# Patient Record
Sex: Female | Born: 1935 | Hispanic: No | Smoking: Former smoker
Health system: Southern US, Community
[De-identification: ages and names within clinical notes are randomized; demographics above are authoritative.]

## PROBLEM LIST (undated history)

## (undated) DIAGNOSIS — I2510 Atherosclerotic heart disease of native coronary artery without angina pectoris: Secondary | ICD-10-CM

## (undated) DIAGNOSIS — M810 Age-related osteoporosis without current pathological fracture: Secondary | ICD-10-CM

## (undated) DIAGNOSIS — E785 Hyperlipidemia, unspecified: Secondary | ICD-10-CM

## (undated) DIAGNOSIS — I10 Essential (primary) hypertension: Secondary | ICD-10-CM

## (undated) DIAGNOSIS — N6009 Solitary cyst of unspecified breast: Secondary | ICD-10-CM

## (undated) DIAGNOSIS — I6340 Cerebral infarction due to embolism of unspecified cerebral artery: Secondary | ICD-10-CM

## (undated) DIAGNOSIS — I739 Peripheral vascular disease, unspecified: Secondary | ICD-10-CM

## (undated) DIAGNOSIS — H3530 Unspecified macular degeneration: Secondary | ICD-10-CM

## (undated) DIAGNOSIS — M129 Arthropathy, unspecified: Secondary | ICD-10-CM

## (undated) DIAGNOSIS — G458 Other transient cerebral ischemic attacks and related syndromes: Secondary | ICD-10-CM

## (undated) DIAGNOSIS — L723 Sebaceous cyst: Secondary | ICD-10-CM

## (undated) HISTORY — DX: Hyperlipidemia, unspecified: E78.5

## (undated) HISTORY — DX: Atherosclerotic heart disease of native coronary artery without angina pectoris: I25.10

## (undated) HISTORY — DX: Essential (primary) hypertension: I10

## (undated) HISTORY — DX: Peripheral vascular disease, unspecified: I73.9

## (undated) HISTORY — DX: Unspecified macular degeneration: H35.30

## (undated) HISTORY — DX: Cerebral infarction due to embolism of unspecified cerebral artery: I63.40

## (undated) HISTORY — DX: Age-related osteoporosis without current pathological fracture: M81.0

## (undated) HISTORY — DX: Arthropathy, unspecified: M12.9

## (undated) HISTORY — DX: Other transient cerebral ischemic attacks and related syndromes: G45.8

## (undated) HISTORY — PX: TONSILLECTOMY: SUR1361

## (undated) HISTORY — DX: Sebaceous cyst: L72.3

## (undated) HISTORY — DX: Solitary cyst of unspecified breast: N60.09

---

## 2005-08-01 DIAGNOSIS — N6009 Solitary cyst of unspecified breast: Secondary | ICD-10-CM

## 2005-08-01 HISTORY — DX: Solitary cyst of unspecified breast: N60.09

## 2008-08-12 ENCOUNTER — Encounter: Payer: Self-pay | Admitting: Internal Medicine

## 2008-09-03 ENCOUNTER — Encounter: Payer: Self-pay | Admitting: Internal Medicine

## 2008-11-11 ENCOUNTER — Encounter: Payer: Self-pay | Admitting: Internal Medicine

## 2009-08-01 HISTORY — PX: OTHER SURGICAL HISTORY: SHX169

## 2009-08-01 HISTORY — PX: BREAST SURGERY: SHX581

## 2009-10-26 ENCOUNTER — Encounter: Payer: Self-pay | Admitting: Internal Medicine

## 2009-11-05 ENCOUNTER — Encounter: Payer: Self-pay | Admitting: Internal Medicine

## 2009-11-05 LAB — CONVERTED CEMR LAB
AST: 13 units/L
Albumin: 4.1 g/dL
Alkaline Phosphatase: 86 units/L
CO2: 25 meq/L
Chloride: 103 meq/L
Cholesterol: 146 mg/dL
Creatinine, Ser: 0.7 mg/dL
HDL: 56 mg/dL
LDL Cholesterol: 77 mg/dL
Platelets: 392 10*3/uL
Potassium: 4.1 meq/L
Sodium: 141 meq/L
TSH: 3.9 microintl units/mL
Triglyceride fasting, serum: 67 mg/dL
WBC: 11.6 10*3/uL

## 2009-12-18 ENCOUNTER — Encounter: Payer: Self-pay | Admitting: Internal Medicine

## 2009-12-18 LAB — CONVERTED CEMR LAB
Cholesterol: 155 mg/dL
HDL: 60 mg/dL
LDL Cholesterol: 82 mg/dL
Triglyceride fasting, serum: 64 mg/dL

## 2010-04-12 ENCOUNTER — Encounter: Payer: Self-pay | Admitting: Internal Medicine

## 2010-04-12 LAB — CONVERTED CEMR LAB
AST: 17 units/L
Alkaline Phosphatase: 84 units/L
CO2: 29 meq/L
Calcium: 9.1 mg/dL
Cholesterol: 173 mg/dL
Glucose, Bld: 95 mg/dL
Sodium: 141 meq/L
Total Bilirubin: 0.4 mg/dL
Triglyceride fasting, serum: 75 mg/dL

## 2010-06-28 ENCOUNTER — Encounter: Payer: Self-pay | Admitting: Internal Medicine

## 2010-06-28 ENCOUNTER — Inpatient Hospital Stay (HOSPITAL_COMMUNITY)
Admission: EM | Admit: 2010-06-28 | Discharge: 2010-07-03 | Payer: Self-pay | Source: Home / Self Care | Attending: Internal Medicine | Admitting: Internal Medicine

## 2010-06-29 ENCOUNTER — Encounter: Payer: Self-pay | Admitting: Internal Medicine

## 2010-06-29 ENCOUNTER — Encounter (INDEPENDENT_AMBULATORY_CARE_PROVIDER_SITE_OTHER): Payer: Self-pay | Admitting: Internal Medicine

## 2010-06-29 LAB — CONVERTED CEMR LAB
Cholesterol: 173 mg/dL
HDL: 50 mg/dL
LDL Cholesterol: 106 mg/dL

## 2010-07-01 ENCOUNTER — Encounter: Payer: Self-pay | Admitting: Internal Medicine

## 2010-07-01 ENCOUNTER — Inpatient Hospital Stay (HOSPITAL_COMMUNITY)
Admission: AD | Admit: 2010-07-01 | Discharge: 2010-07-03 | Payer: Self-pay | Attending: Internal Medicine | Admitting: Internal Medicine

## 2010-07-01 ENCOUNTER — Ambulatory Visit: Payer: Self-pay | Admitting: Cardiology

## 2010-07-01 DIAGNOSIS — I6340 Cerebral infarction due to embolism of unspecified cerebral artery: Secondary | ICD-10-CM

## 2010-07-01 HISTORY — DX: Cerebral infarction due to embolism of unspecified cerebral artery: I63.40

## 2010-07-01 LAB — CONVERTED CEMR LAB
Lymphocytes, automated: 36 %
MCV: 88.9 fL
Neutrophils Relative %: 53 %
Platelets: 278 10*3/uL
RBC: 4.34 M/uL

## 2010-07-02 ENCOUNTER — Encounter: Payer: Self-pay | Admitting: Internal Medicine

## 2010-07-02 LAB — CONVERTED CEMR LAB
BUN: 18 mg/dL
Chloride: 106 meq/L
Creatinine, Ser: 0.8 mg/dL
Glucose, Bld: 108 mg/dL
Potassium: 3.3 meq/L
Sodium: 143 meq/L

## 2010-07-05 ENCOUNTER — Telehealth (INDEPENDENT_AMBULATORY_CARE_PROVIDER_SITE_OTHER): Payer: Self-pay | Admitting: *Deleted

## 2010-07-12 ENCOUNTER — Encounter: Payer: Self-pay | Admitting: Internal Medicine

## 2010-07-12 DIAGNOSIS — I2510 Atherosclerotic heart disease of native coronary artery without angina pectoris: Secondary | ICD-10-CM

## 2010-07-12 DIAGNOSIS — I10 Essential (primary) hypertension: Secondary | ICD-10-CM

## 2010-07-12 DIAGNOSIS — E785 Hyperlipidemia, unspecified: Secondary | ICD-10-CM

## 2010-07-13 ENCOUNTER — Ambulatory Visit: Payer: Self-pay | Admitting: Internal Medicine

## 2010-07-13 DIAGNOSIS — E876 Hypokalemia: Secondary | ICD-10-CM

## 2010-07-13 DIAGNOSIS — I739 Peripheral vascular disease, unspecified: Secondary | ICD-10-CM | POA: Insufficient documentation

## 2010-07-13 DIAGNOSIS — I6340 Cerebral infarction due to embolism of unspecified cerebral artery: Secondary | ICD-10-CM

## 2010-07-13 DIAGNOSIS — G458 Other transient cerebral ischemic attacks and related syndromes: Secondary | ICD-10-CM

## 2010-07-13 DIAGNOSIS — M129 Arthropathy, unspecified: Secondary | ICD-10-CM | POA: Insufficient documentation

## 2010-07-13 DIAGNOSIS — H3530 Unspecified macular degeneration: Secondary | ICD-10-CM | POA: Insufficient documentation

## 2010-07-13 LAB — CONVERTED CEMR LAB
BUN: 19 mg/dL (ref 6–23)
Calcium: 9.2 mg/dL (ref 8.4–10.5)
GFR calc non Af Amer: 82.86 mL/min (ref >60.00)
Potassium: 4.6 meq/L (ref 3.5–5.1)
Sodium: 144 meq/L (ref 135–145)

## 2010-07-14 ENCOUNTER — Telehealth: Payer: Self-pay | Admitting: Internal Medicine

## 2010-07-21 ENCOUNTER — Ambulatory Visit: Payer: Self-pay | Admitting: Vascular Surgery

## 2010-07-21 ENCOUNTER — Encounter: Payer: Self-pay | Admitting: Internal Medicine

## 2010-07-22 ENCOUNTER — Telehealth: Payer: Self-pay | Admitting: Internal Medicine

## 2010-08-03 ENCOUNTER — Encounter: Payer: Self-pay | Admitting: Internal Medicine

## 2010-08-26 ENCOUNTER — Ambulatory Visit
Admission: RE | Admit: 2010-08-26 | Discharge: 2010-08-26 | Payer: Self-pay | Source: Home / Self Care | Attending: Vascular Surgery | Admitting: Vascular Surgery

## 2010-08-26 ENCOUNTER — Encounter: Payer: Self-pay | Admitting: Internal Medicine

## 2010-08-27 NOTE — Assessment & Plan Note (Signed)
OFFICE VISIT  [name], [name] DOB:  11/02/1921                                       08/26/2010 ZOXWR#:[id number]  The patient is an 77 year old female referred by [name] for evaluation for possible carotid stenting.  She was previously seen by [name] on 07/21/2010.  Recent history is remarkable for a left brain stroke which manifested as right arm weakness; symptoms lasted approximately 24 hours.  A CT scan showed acute infarcts in the left parietal lobe.  She also had a carotid duplex scan at that time which showed a 60% left carotid stenosis.  This was followed up with a CT angiogram which suggested a 90% left carotid stenosis.  She was seen by the neurology service during that hospitalization.  She has had no symptoms of TIA, amaurosis or stroke since her hospitalization in November of 2011.  She currently is on Plavix.  Past medical history, social history, family history is as previously documented by [name] on December 21.  REVIEW OF SYSTEMS:  Also as listed on December 21 is unchanged.  MEDICATIONS:  Include Plavix, aspirin, atenolol, Pravachol and vitamin D.  PHYSICAL EXAM:  Vital signs:  Blood pressure is 141/78 in the left arm, heart rate is 61 and regular.  HEENT:  Unremarkable.  Neck:  Has 2+ carotid pulses without bruit.  Upper extremity pulses, she has absent left radial pulse.  She has 2+ right radial pulse.  Cardiac:  Regular rate and rhythm without murmur.  Chest:  Clear to auscultation. Abdomen:  Soft, nontender, no masses.  Lower extremities:  She has 2+ femoral pulses bilaterally.  Skin:  Has no open ulcers or rashes.  I had a lengthy discussion with the patient and her daughter today. Multiple questions were answered as well as demonstration of carotid stenting, carotid endarterectomy, risks, benefits of both of these procedures were explained to the patient and her daughter at length and in full detail.  I  described to them that her risk of stroke long-term with medical therapy alone would probably be on the order of about 5% to 10% per year.  I discussed with her that one option would be carotid endarterectomy with a stroke risk of 1% to 2% and below overall risk of myocardial events but potentially as high as 5%.  I also discussed with her the possibility of carotid stenting.  However, the risk of carotid stenting currently is about 5% and this percentage is higher in patients over the age of 28 most likely due to arch disease.  After discussion of all of these options with the patient I believe the best option for her would be a carotid endarterectomy.  I believe she is a reasonable candidate for this.  I believe her stroke risk would be lower with endarterectomy than with stenting.  I believe her overall procedure risk would be minimal.  The patient and her daughter state that they want to continue to think about which procedure they would prefer or possibly medical management.  They will follow up on an as-needed basis.  They will call [name] or myself if they wish to have intervention at some point in the future.    Janetta Hora. Fields, MD Electronically Signed  CEF/MEDQ  D:  08/27/2010  T:  08/27/2010  Job:  [id number]  cc:   Di Kindle. Edilia Bo, M.D.  Valerie A. Felicity Coyer, MD Pramod P. Pearlean Brownie, MD

## 2010-08-31 NOTE — Letter (Signed)
Summary: Consult/[facility]  Consult/   Imported By: Sherian Rein 07/01/2010 12:54:02  _____________________________________________________________________  External Attachment:    Type:   Image     Comment:   External Document

## 2010-08-31 NOTE — Progress Notes (Signed)
----   Converted from flag ---- ---- 07/05/2010 12:14 PM, Newt Lukes MD wrote: ok to work into space next week  ---- 07/05/2010 9:50 AM, Ivar Bury wrote: Dr Felicity Coyer.  Lamyia Cdebaca will be a new pt with you on 07/28/10.  Her dau/Diane is requesting you to see her earlier dy because she was disch'd from hosp this past Sat from a stroke.  Pt is a medicare pt.  Thank you for your reply ------------------------------  Given appt--phone:  07/13/10 @ 930A w/Dr Felicity Coyer----

## 2010-09-02 NOTE — Assessment & Plan Note (Signed)
Summary: NEW / [employer] / #/CD   Vital Signs:  Patient profile:   77 year old female Height:      60 inches (152.40 cm) Weight:      113.0 pounds (51.36 kg) BMI:     22.15 O2 Sat:      94 % on Room air Temp:     97.7 degrees F (36.50 degrees C) oral Pulse rate:   62 / minute BP sitting:   122 / 72  (left arm) Cuff size:   regular  Vitals Entered By: Orlan Leavens RMA (July 13, 2010 9:36 AM)  O2 Flow:  Room air CC: New patient Is Patient Diabetic? No Pain Assessment Patient in pain? no      Comments Pt was d/c from hosp 07/03/10   Primary Care Provider:  Newt Lukes MD  CC:  New patient.  History of Present Illness: new pt to me and our practice -  1) recent hosp for CVA 07/2010 -  hosp reviewed including imaging and labs/meds - included neuro consult - R side weakness, improved - ongoing HH known hx PVD -90% L ICA stenosis and possible right subclavian steal changes on angio planned f/u with vasc for same but pt reports prior eval for same - no stent or tx rec  2) HTN - prev on amlodipine and bbloc - no bbloc alone reports compliance with ongoing medical treatment and no new changes in medication dose or frequency. denies adverse side effects related to current therapy.   3) dyslipidemia -  reports compliance with ongoing medical treatment and no changes in medication dose or frequency. denies adverse side effects related to current therapy.   Preventive Screening-Counseling & Management  Alcohol-Tobacco     Alcohol drinks/day: <1     Alcohol Counseling: not indicated; use of alcohol is not excessive or problematic     Smoking Status: quit  Caffeine-Diet-Exercise     Exercise Counseling: to improve exercise regimen  Clinical Review Panels:  Lipid Management   Cholesterol:  173 (06/29/2010)   LDL (bad choesterol):  106 (06/29/2010)   HDL (good cholesterol):  50 (06/29/2010)   Triglycerides:  84 (06/29/2010)  Diabetes Management   HgBA1C:  6.0  (06/28/2010)   Creatinine:  0.80 (07/02/2010)  CBC   WBC:  8.3 (07/01/2010)   RBC:  4.34 (07/01/2010)   Hgb:  13.1 (07/01/2010)   Hct:  38.6 (07/01/2010)   Platelets:  278 (07/01/2010)   MCV  88.9 (07/01/2010)   RDW  14.8 (07/01/2010)   PMN:  53 (07/01/2010)   Monos:  8 (07/01/2010)   Eosinophils:  3 (07/01/2010)   Basophil:  0 (07/01/2010)  Complete Metabolic Panel   Glucose:  108 (07/02/2010)   Sodium:  143 (07/02/2010)   Potassium:  3.3 (07/02/2010)   Chloride:  106 (07/02/2010)   CO2:  28 (07/02/2010)   BUN:  18 (07/02/2010)   Creatinine:  0.80 (07/02/2010)   Calcium:  9.2 (07/02/2010)   Current Medications (verified): 1)  Plavix 75 Mg Tabs (Clopidogrel Bisulfate) .... Take 1 By Mouth Once Daily 2)  Aspirin 81 Mg Tabs (Aspirin) .... Take 1 By Mouth Once Daily 3)  Atenolol 50 Mg Tabs (Atenolol) .... Take 1 By Mouth Once Daily 4)  Pravachol 20 Mg Tabs (Pravastatin Sodium) .... Take 1 By Mouth Once Daily 5)  Vitamin D 1000 Unit Tabs (Cholecalciferol) .... Take 1 By Mouth Once Daily  Allergies (verified): 1)  ! Penicillin 2)  ! Sulfa  Past History:  Past Medical History: Coronary artery disease Hyperlipidemia Hypertension Cerebrovascular accident, hx 07/2010 - R HP, mild macular degen  Past Surgical History: Tonsillectomy Nose (2008) Sabacious cyst removed from breast (2011)  Family History: Heart disease (parent) Stroke (other relative)  Social History: Former Smoker, quit 09/2009 occ alcohol use widowed, lives in indep living moved to [place] from [place] fall 2011 to be near dtr Smoking Status:  quit  Review of Systems       see HPI above. I have reviewed all other systems and they were negative.   Physical Exam  General:  alert, well-developed, well-nourished, and cooperative to examination.   dtr at side Eyes:  vision grossly intact; pupils equal, round and reactive to light.  conjunctiva and lids normal.    Lungs:  normal respiratory effort, no  intercostal retractions or use of accessory muscles; normal breath sounds bilaterally - no crackles and no wheezes.    Heart:  normal rate, regular rhythm, no murmur, and no rub. BLE without edema. Msk:  No deformity or scoliosis noted of thoracic or lumbar spine.   Neurologic:  alert & oriented X3 and cranial nerves II-XII symetrically intact.  strength normal in all extremities, sensation intact to light touch, and gait normal. speech fluent without dysarthria or aphasia; follows commands with good comprehension.  Psych:  Oriented X3, memory intact for recent and remote, normally interactive, good eye contact, not anxious appearing, not depressed appearing, and not agitated.      Impression & Recommendations:  Problem # 1:  CEREBRAL EMBOLISM, WITH INFARCTION (ICD-434.11) 07/2010 hosp for same including labs/test/dc meds reviewed- no med changes rec no deficits ion exam - f/u neuro as planned reviewed heart healthy diet rec - take home info provided Her updated medication list for this problem includes:    Plavix 75 Mg Tabs (Clopidogrel bisulfate) .Marland Kitchen... Take 1 by mouth once daily    Aspirin 81 Mg Tabs (Aspirin) .Marland Kitchen... 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 Take 1 by mouth once daily    Aspirin 81 Mg Tabs (Aspirin) .Marland Kitchen... Take 1 by mouth once daily  Problem # 4:  HYPERTENSION (ICD-401.9)  well controlled - no indication to resume amlodipine at this time Her updated medication list for this problem includes:    Atenolol 50 Mg Tabs  (Atenolol) .Marland Kitchen... Take 1 by mouth once daily  Orders: TLB-BMP (Basic Metabolic Panel-BMET) (80048-METABOL)  BP today: 122/72 Labs Reviewed: K+: 3.3 (07/02/2010) Creat: : 0.80 (07/02/2010)   Chol: 173 (06/29/2010)   HDL: 50 (06/29/2010)   LDL: 106 (06/29/2010)   TG: 84 (06/29/2010)  Problem # 5:  HYPERLIPIDEMIA (ICD-272.4)  Her updated medication list for this problem includes:    Pravachol 20 Mg Tabs (Pravastatin sodium) .Marland Kitchen... Take 1 by mouth once daily    HDL:50 (06/29/2010)  LDL:106 (06/29/2010)  Chol:173 (06/29/2010)  Trig:84 (06/29/2010)  Complete Medication List: 1)  Plavix 75 Mg Tabs (Clopidogrel bisulfate) .... Take 1 by mouth once daily 2)  Aspirin 81 Mg Tabs (Aspirin) .Marland KitchenMarland KitchenMarland Kitchen  Take 1 by mouth once daily 3)  Atenolol 50 Mg Tabs (Atenolol) .... Take 1 by mouth once daily 4)  Pravachol 20 Mg Tabs (Pravastatin sodium) .... Take 1 by mouth once daily 5)  Vitamin D 1000 Unit Tabs (Cholecalciferol) .... Take 1 by mouth once daily  Patient Instructions: 1)  it was good to see you today. 2)  recent hospitalization and labs reviewed - - 3)  continue medications as reviewed today, no changes, continue to hold norvasc 4)  test(s) ordered today - your results will be posted on the phone tree for review in 48-72 hours from the time of test completion; call [phone] and enter your 9 digit MRN (listed above on this page, just below your name); if any changes need to be made or there are abnormal results, you will be contacted directly.  5)  will send for records from your [facility] doctor to review 6)  followup with dr. Pearlean Brownie and vascular specialist as planned  7)  Please schedule a follow-up appointment in 3 months to monitor blood pressure and medications, call sooner if problems.    Orders Added: 1)  TLB-BMP (Basic Metabolic Panel-BMET) [80048-METABOL] 2)  New Patient Level III [78469]

## 2010-09-02 NOTE — Progress Notes (Signed)
Summary: Rx req  Phone Note Refill Request Message from:  Patient on July 22, 2010 1:34 PM  Refills Requested: Medication #1:  PLAVIX 75 MG TABS take 1 by mouth once daily   Dosage confirmed as above?Dosage Confirmed   Supply Requested: 6 months Pt's daughter called requesting refill. Rx originally Rx at hospital   Method Requested: Electronic Initial call taken by: Margaret Pyle, CMA,  July 22, 2010 1:35 PM  Follow-up for Phone Call        left message on machine for pt to return my call with pharmacy reason. Margaret Pyle, CMA  July 23, 2010 8:04 AM     Prescriptions: PLAVIX 75 MG TABS (CLOPIDOGREL BISULFATE) take 1 by mouth once daily  #90 x 1   Entered by:   Margaret Pyle, CMA   Authorized by:   Newt Lukes MD   Signed by:   Margaret Pyle, CMA on 07/23/2010   Method used:   [facility]  [place] [address]       [address]       [place], [place]  [address]       Ph: [phone] or [phone]       Fax: [phone]   RxID:   [id number]

## 2010-09-02 NOTE — Progress Notes (Signed)
Summary: Referral  Phone Note Call from Patient   Caller: Daughter Sedalia Muta [phone] Summary of Call: Pt's daughter called requesting referral to [facility] Specialist for pt. Initial call taken by: Margaret Pyle, CMA,  July 14, 2010 11:42 AM  Follow-up for Phone Call        ok - referral to VVS done - [facility] will call once arranged Follow-up by: Newt Lukes MD,  July 14, 2010 12:05 PM  Additional Follow-up for Phone Call Additional follow up Details #1::        daughter Sedalia Muta advise and will expect call from [facility] Additional Follow-up by: Margaret Pyle, CMA,  July 14, 2010 2:14 PM

## 2010-09-02 NOTE — Consult Note (Signed)
Summary: [facility]  [facility] [place]   Imported By: Sherian Rein 07/29/2010 10:05:32  _____________________________________________________________________  External Attachment:    Type:   Image     Comment:   External Document

## 2010-09-16 NOTE — Letter (Signed)
Summary: [facility]  [facility] [place] [facility]   Imported By: Sherian Rein 09/09/2010 09:01:50  _____________________________________________________________________  External Attachment:    Type:   Image     Comment:   External Document

## 2010-10-12 ENCOUNTER — Ambulatory Visit (INDEPENDENT_AMBULATORY_CARE_PROVIDER_SITE_OTHER): Payer: Medicare HMO | Admitting: Internal Medicine

## 2010-10-12 ENCOUNTER — Encounter: Payer: Self-pay | Admitting: Internal Medicine

## 2010-10-12 DIAGNOSIS — I10 Essential (primary) hypertension: Secondary | ICD-10-CM

## 2010-10-12 DIAGNOSIS — I6340 Cerebral infarction due to embolism of unspecified cerebral artery: Secondary | ICD-10-CM

## 2010-10-12 DIAGNOSIS — E785 Hyperlipidemia, unspecified: Secondary | ICD-10-CM

## 2010-10-12 DIAGNOSIS — L723 Sebaceous cyst: Secondary | ICD-10-CM | POA: Insufficient documentation

## 2010-10-12 DIAGNOSIS — I739 Peripheral vascular disease, unspecified: Secondary | ICD-10-CM

## 2010-10-12 LAB — BASIC METABOLIC PANEL
BUN: 19 mg/dL (ref 6–23)
BUN: 18 mg/dL (ref 6–23)
Creatinine, Ser: 0.79 mg/dL (ref 0.4–1.2)
Creatinine, Ser: 0.8 mg/dL (ref 0.4–1.2)
GFR calc Af Amer: >60 mL/min (ref >60)
GFR calc non Af Amer: >60 mL/min (ref >60)
GFR calc non Af Amer: >60 mL/min (ref >60)
Glucose, Bld: 104 mg/dL — ABNORMAL HIGH (ref 70–99)
Potassium: 3.6 mEq/L (ref 3.5–5.1)
Potassium: 3.3 mEq/L — ABNORMAL LOW (ref 3.5–5.1)

## 2010-10-12 LAB — DIFFERENTIAL
Basophils Absolute: 0 10*3/uL (ref 0.0–0.1)
Basophils Relative: 0 % (ref 0–1)
Eosinophils Relative: 3 % (ref 0–5)
Monocytes Absolute: 0.7 10*3/uL (ref 0.1–1.0)
Neutro Abs: 4.4 10*3/uL (ref 1.7–7.7)

## 2010-10-12 LAB — CBC
HCT: 38.6 % (ref 36.0–46.0)
MCHC: 33.9 g/dL (ref 30.0–36.0)
Platelets: 278 10*3/uL (ref 150–400)
RDW: 14.8 % (ref 11.5–15.5)
WBC: 8.3 10*3/uL (ref 4.0–10.5)

## 2010-10-13 LAB — DIFFERENTIAL
Basophils Absolute: 0 10*3/uL (ref 0.0–0.1)
Basophils Relative: 0 % (ref 0–1)
Eosinophils Absolute: 0.1 10*3/uL (ref 0.0–0.7)
Lymphocytes Relative: 24 % (ref 12–46)
Lymphocytes Relative: 31 % (ref 12–46)
Lymphs Abs: 2.3 10*3/uL (ref 0.7–4.0)
Monocytes Absolute: 0.7 10*3/uL (ref 0.1–1.0)
Neutro Abs: 4.8 10*3/uL (ref 1.7–7.7)
Neutro Abs: 6.8 10*3/uL (ref 1.7–7.7)
Neutrophils Relative %: 58 % (ref 43–77)
Neutrophils Relative %: 69 % (ref 43–77)

## 2010-10-13 LAB — BASIC METABOLIC PANEL
BUN: 20 mg/dL (ref 6–23)
Calcium: 9.4 mg/dL (ref 8.4–10.5)
Chloride: 108 mEq/L (ref 96–112)
Creatinine, Ser: 0.77 mg/dL (ref 0.4–1.2)
GFR calc Af Amer: >60 mL/min (ref >60)
GFR calc non Af Amer: >60 mL/min (ref >60)
GFR calc non Af Amer: >60 mL/min (ref >60)
Potassium: 3.4 mEq/L — ABNORMAL LOW (ref 3.5–5.1)
Sodium: 141 mEq/L (ref 135–145)
Sodium: 143 mEq/L (ref 135–145)

## 2010-10-13 LAB — CBC
HCT: 37 % (ref 36.0–46.0)
MCHC: 33.6 g/dL (ref 30.0–36.0)
Platelets: 269 10*3/uL (ref 150–400)
Platelets: 287 10*3/uL (ref 150–400)
RBC: 4.46 MIL/uL (ref 3.87–5.11)
RDW: 15.6 % — ABNORMAL HIGH (ref 11.5–15.5)
WBC: 8.3 10*3/uL (ref 4.0–10.5)
WBC: 9.9 10*3/uL (ref 4.0–10.5)

## 2010-10-13 LAB — APTT: aPTT: 33 seconds (ref 24–37)

## 2010-10-13 LAB — COMPREHENSIVE METABOLIC PANEL
Alkaline Phosphatase: 80 U/L (ref 39–117)
BUN: 21 mg/dL (ref 6–23)
CO2: 29 mEq/L (ref 19–32)
Calcium: 9.4 mg/dL (ref 8.4–10.5)
GFR calc non Af Amer: >60 mL/min (ref >60)
Glucose, Bld: 142 mg/dL — ABNORMAL HIGH (ref 70–99)
Total Protein: 6.9 g/dL (ref 6.0–8.3)

## 2010-10-13 LAB — PROTIME-INR
INR: 0.96 (ref 0.00–1.49)
Prothrombin Time: 13 seconds (ref 11.6–15.2)

## 2010-10-13 LAB — CK TOTAL AND CKMB (NOT AT ARMC)
Relative Index: INVALID (ref 0.0–2.5)
Total CK: 30 U/L (ref 7–177)

## 2010-10-13 LAB — CARDIAC PANEL(CRET KIN+CKTOT+MB+TROPI)
CK, MB: 1.2 ng/mL (ref 0.3–4.0)
CK, MB: 1.4 ng/mL (ref 0.3–4.0)
Relative Index: INVALID (ref 0.0–2.5)
Relative Index: INVALID (ref 0.0–2.5)
Total CK: 29 U/L (ref 7–177)
Total CK: 30 U/L (ref 7–177)
Troponin I: 0.04 ng/mL (ref 0.00–0.06)
Troponin I: 0.04 ng/mL (ref 0.00–0.06)

## 2010-10-13 LAB — FOLATE RBC: RBC Folate: 457 ng/mL (ref 180–600)

## 2010-10-13 LAB — HEMOGLOBIN A1C
Hgb A1c MFr Bld: 6 % — ABNORMAL HIGH (ref <5.7)
Mean Plasma Glucose: 126 mg/dL — ABNORMAL HIGH (ref <117)

## 2010-10-13 LAB — LIPID PANEL
Cholesterol: 173 mg/dL (ref 0–200)
LDL Cholesterol: 106 mg/dL — ABNORMAL HIGH (ref 0–99)
Total CHOL/HDL Ratio: 3.5 RATIO
VLDL: 17 mg/dL (ref 0–40)

## 2010-10-13 LAB — POCT CARDIAC MARKERS: Myoglobin, poc: 54.8 ng/mL (ref 12–200)

## 2010-10-19 ENCOUNTER — Telehealth: Payer: Self-pay

## 2010-10-19 NOTE — Telephone Encounter (Signed)
Pt's daughter called stating that pt's Plavix Rx has more than doubled in cost. Daughter is requesting alternative medication.

## 2010-10-19 NOTE — Assessment & Plan Note (Signed)
Summary: 3 MO FOLLOW UP//STC   Vital Signs:  Patient profile:   77 year old female Weight:      117 pounds (53.18 kg) O2 Sat:      97 % on Room air Temp:     98.6 degrees F (37.00 degrees C) oral Pulse rate:   59 / minute BP sitting:   136 / 82  (left arm) Cuff size:   regular  Vitals Entered By: Orlan Leavens RMA (October 12, 2010 11:30 AM)  O2 Flow:  Room air CC: 3 month follow-up Is Patient Diabetic? No Pain Assessment Patient in pain? no        Primary Care Provider:  Newt Lukes MD  CC:  3 month follow-up.  History of Present Illness: here for f/u  1) hx CVA 07/2010 -  R side weakness, improved -  known hx PVD -90% L ICA stenosis and possible right subclavian steal changes on angio s/p consult with vasc x 2 for same - rec CEA but pt reports [place] doc prev rec against stent or cea  2) HTN - prev on amlodipine and bbloc - now bbloc alone since cva reports compliance with ongoing medical treatment and no new changes in medication dose or frequency. denies adverse side effects related to current therapy.   3) dyslipidemia -  reports compliance with ongoing medical treatment and no changes in medication dose or frequency. denies adverse side effects related to current therapy.   Clinical Review Panels:  Lipid Management   Cholesterol:  173 (06/29/2010)   LDL (bad choesterol):  106 (06/29/2010)   HDL (good cholesterol):  50 (06/29/2010)   Triglycerides:  84 (06/29/2010)  Diabetes Management   HgBA1C:  6.0 (06/28/2010)   Creatinine:  0.7 (07/13/2010)  CBC   WBC:  8.3 (07/01/2010)   RBC:  4.34 (07/01/2010)   Hgb:  13.1 (07/01/2010)   Hct:  38.6 (07/01/2010)   Platelets:  278 (07/01/2010)   MCV  88.9 (07/01/2010)   RDW  14.8 (07/01/2010)   PMN:  53 (07/01/2010)   Monos:  8 (07/01/2010)   Eosinophils:  3 (07/01/2010)   Basophil:  0 (07/01/2010)  Complete Metabolic Panel   Glucose:  103 (07/13/2010)   Sodium:  144 (07/13/2010)   Potassium:  4.6  (07/13/2010)   Chloride:  107 (07/13/2010)   CO2:  30 (07/13/2010)   BUN:  19 (07/13/2010)   Creatinine:  0.7 (07/13/2010)   Albumin:  4.0 (04/12/2010)   Total Protein:  6.5 (04/12/2010)   Calcium:  9.2 (07/13/2010)   Total Bili:  0.4 (04/12/2010)   Alk Phos:  84 (04/12/2010)   SGPT (ALT):  11 (04/12/2010)   SGOT (AST):  17 (04/12/2010)   Current Medications (verified): 1)  Plavix 75 Mg Tabs (Clopidogrel Bisulfate) .... Take 1 By Mouth Once Daily 2)  Atenolol 50 Mg Tabs (Atenolol) .... Take 1 By Mouth Once Daily 3)  Pravachol 20 Mg Tabs (Pravastatin Sodium) .... Take 1 By Mouth Once Daily 4)  Vitamin D 1000 Unit Tabs (Cholecalciferol) .... Take 1 By Mouth Once Daily  Allergies (verified): 1)  ! Penicillin 2)  ! Sulfa  Past History:  Past Medical History: Coronary artery disease  Hyperlipidemia Hypertension Cerebrovascular accident, hx 07/2010 - R HP, mild macular degen  Review of Systems  The patient denies hoarseness, syncope, and headaches.    Physical Exam  General:  alert, well-developed, well-nourished, and cooperative to examination.   dtr at side Lungs:  normal respiratory effort, no  intercostal retractions or use of accessory muscles; normal breath sounds bilaterally - no crackles and no wheezes.    Heart:  normal rate, regular rhythm, no murmur, and no rub. BLE without edema. Psych:  Oriented X3, memory intact for recent and remote, normally interactive, good eye contact, not anxious appearing, not depressed appearing, and not agitated.      Impression & Recommendations:  Problem # 1:  PERIPHERAL VASCULAR DISEASE (ICD-443.9)  90% left ICAS on angio - s/p vasc consult x 2 for this and rec CEA, not stent  -  pt remians concerned as prev eval in [place] rec  "med mgmt" per pt - - explained this prev rec was prior to CVA event pt will cont to consider and f/u VVS if decides to proceed  Time spent with patient/dtr 35 min minutes, more than 50% of this time was  spent counseling patient on PAD, med mgmt and recs for CEA - also review of medications and testing  Problem # 2:  HYPERTENSION (ICD-401.9)  Her updated medication list for this problem includes:    Atenolol 50 Mg Tabs (Atenolol) .Marland Kitchen... Take 1 by mouth once daily  well controlled - no indication to resume amlodipine at this time  BP today: 136/82 Prior BP: 122/72 (07/13/2010)  Labs Reviewed: K+: 4.6 (07/13/2010) Creat: : 0.7 (07/13/2010)   Chol: 173 (06/29/2010)   HDL: 50 (06/29/2010)   LDL: 106 (06/29/2010)   TG: 84 (06/29/2010)  Problem # 3:  HYPERLIPIDEMIA (ICD-272.4)  Her updated medication list for this problem includes:    Pravachol 20 Mg Tabs (Pravastatin sodium) .Marland Kitchen... Take 1 by mouth once daily  Labs Reviewed: SGOT: 17 (04/12/2010)   SGPT: 11 (04/12/2010)   HDL:50 (06/29/2010), 58 (04/12/2010)  LDL:106 (06/29/2010), 100 (04/12/2010)  Chol:173 (06/29/2010), 173 (04/12/2010)  Trig:84 (06/29/2010), 75 (04/12/2010)  Problem # 4:  CEREBRAL EMBOLISM, WITH INFARCTION (ICD-434.11)  The following medications were removed from the medication list:    Aspirin 81 Mg Tabs (Aspirin) .Marland Kitchen... Take 1 by mouth once daily Her updated medication list for this problem includes:    Plavix 75 Mg Tabs (Clopidogrel bisulfate) .Marland Kitchen... Take 1 by mouth once daily  07/2010 hosp for same- no med changes rec no deficits on exam - f/u neuro as planned reviewed heart healthy diet rec -   Complete Medication List: 1)  Plavix 75 Mg Tabs (Clopidogrel bisulfate) .... Take 1 by mouth once daily 2)  Atenolol 50 Mg Tabs (Atenolol) .... Take 1 by mouth once daily 3)  Pravachol 20 Mg Tabs (Pravastatin sodium) .... Take 1 by mouth once daily 4)  Vitamin D 1000 Unit Tabs (Cholecalciferol) .... Take 1 by mouth once daily  Patient Instructions: 1)  it was good to see you today. 2)  continue medications as reviewed today, no changes.   3)  Please schedule a follow-up appointment in 3 months to monitor blood  pressure and medications, call sooner if problems. will check cholesterol and other labs next visit 4)  Check your Blood Pressure regularly. If it is above: 135/85 you should make an appointment. 5)  we'll make referral to dermatology. Our office will contact you regarding this appointment once made.  Prescriptions: ATENOLOL 50 MG TABS (ATENOLOL) take 1 by mouth once daily  #90 x 1   Entered and Authorized by:   Newt Lukes MD   Signed by:   Newt Lukes MD on 10/12/2010   Method used:   [facility]  [place] [address] ([facility]       [address]       [place], [place]  [address]       Ph: [phone] or [phone]       Fax: [phone]   RxID:   [id number] PRAVACHOL 20 MG TABS (PRAVASTATIN SODIUM) take 1 by mouth once daily  #90 x 1   Entered and Authorized by:   Newt Lukes MD   Signed by:   Newt Lukes MD on 10/12/2010   Method used:   [facility]        [facility]  [place] [address] [facility]       [address]       [place], [place]  [address]       Ph: [phone] or [phone]       Fax: [phone]   RxID:   [id number] PLAVIX 75 MG TABS (CLOPIDOGREL BISULFATE) take 1 by mouth once daily  #90 x 1   Entered and Authorized by:   Newt Lukes MD   Signed by:   Newt Lukes MD on 10/12/2010   Method used:   [facility]        [facility]  [place] [address] [facility]       [address]       [place], [place]  [address]       Ph: [phone] or [phone]       Fax: [phone]   RxID:   [id number]    Orders Added: 1)  Est. 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

## 2010-10-19 NOTE — Telephone Encounter (Signed)
There is no alternative to plavix - no generic and no other med in same class. Could consider change Plavix to plain aspirin, but given pt hx stroke and vascular dz, i do not recommend this med change.

## 2010-10-20 NOTE — Telephone Encounter (Signed)
Pt's daughter called back and was advised of MD request. I also advised checking with manufacturer for discounts and coupons.

## 2010-12-14 NOTE — Consult Note (Signed)
NEW PATIENT CONSULTATION   [name], [name]  DOB:  1922-05-07                                       07/21/2010  EAVWU#:[id number]   I saw this patient in the office today in consultation concerning her  carotid disease and recent stroke.  She was referred by Dr. Felicity Coyer.  This is a pleasant 77 year old right-handed woman who just after  Thanksgiving developed the sudden onset of weakness in her right arm.  According to the patient, these symptoms lasted less than 24 hours.  She  was admitted to the hospital and underwent a workup including a carotid  duplex scan which showed a 60%-79% left carotid stenosis.  CT scan  showed scattered acute infarcts in the left parietal lobe.  CTA showed a  90% left carotid stenosis.  During this hospitalization, Dr. Pearlean Brownie had  discussed carotid stenting with the patient and at that time the patient  refused carotid stenting.  Of note, she had no associated expressive or  receptive aphasia.  She has had no previous history of amaurosis fugax.  She does state that in March she had an episode where she had some  weakness in her right arm and the left leg.  She was apparently  evaluated in [place] and the physician there did not recommend  surgery for the left carotid stenosis because of the risk of stroke  associated with surgery or placement of a stent.   Since the patient's discharge, she has had no new neurologic symptoms.  She was placed on Plavix during that admission and she has had no  further episodes of weakness or paresthesias.   PAST MEDICAL HISTORY:  Significant for hypertension and hyperlipidemia.  She denies any history of diabetes, history of previous myocardial  infarction, history of congestive heart failure or history of COPD.   SOCIAL HISTORY:  She is widowed.  She has 1 child.  She quit tobacco 3-4  months ago.   FAMILY HISTORY:  She is unaware of any history of premature  cardiovascular disease.   ALLERGIES:  SULFA and PENICILLIN.   MEDICATIONS:  1. Plavix 75 mg p.o. daily.  2. Aspirin 81 mg p.o. daily.  3. Atenolol 50 mg p.o. daily.  4. Pravachol 20 mg p.o. nightly.  5. Vitamin D p.o. daily.   REVIEW OF SYSTEMS:  GENERAL:  She has had no recent weight loss, weight  gain or problems in her appetite.  CARDIOVASCULAR:  She has had no chest pain, chest pressure, palpitations  or arrhythmias.  She denies dyspnea on exertion or orthopnea.  She  states she can walk up stairs without any shortness of breath.  She  denies any history of DVT or phlebitis.  ENT:  She has had some gradual change in her eyesight.  MUSCULOSKELETAL:  She does have rotator cuff pain.  GI:  Unremarkable.  PULMONARY:  Unremarkable.  HEMATOLOGIC:  Unremarkable.  GU:  Unremarkable.  PSYCHIATRIC:  Unremarkable.  NEUROLOGIC:  She denies any dizziness.  She has had no arm pain.  INTEGUMENTARY:  She has had problems with sebaceous cysts including an  infected sebaceous cyst in her breast that was removed in June 2011.   PHYSICAL EXAMINATION:  General:  This is a pleasant 77 year old woman  who appears her stated age.  Vital signs:  Her blood pressure is 143/72,  heart rate is  55, saturation 97%.  HEENT:  Pupils are equal, round,  reactive to light and accommodation.  Extraocular motions are intact.  Conjunctivae are normal.  She does have a sebaceous cyst adjacent to her  left mandible but this does not appear to be infected.  Lungs:  Clear  bilaterally to auscultation without rales, rhonchi or wheezing.  Cardiovascular:  She has a right carotid bruit.  I cannot appreciate a  left carotid bruit.  She has a regular rate and rhythm.  She has  palpable femoral pulses and warm well-perfused feet without ischemic  ulcers.  She has no significant lower extremity swelling.  She has a  palpable right radial pulse.  I cannot palpate a left radial pulse.  Abdomen:  Soft and nontender with normal-pitched bowel sounds.   No  aneurysm is appreciated.  Musculoskeletal:  There is no major deformity  or cyanosis.  Neurologic:  She has no focal weakness or paresthesias.  Skin:  There are no ulcers or rashes.   I have reviewed her arterial Doppler study from [facility] which  shows evidence of a 60%-79% left carotid stenosis by velocity criteria.  There was no significant stenosis on the right.  Both vertebral arteries  on this study were noted to have antegrade flow.   I have also reviewed her CT of the head which shows scattered acute  infarcts in the left parietal lobe.  Her CTA suggests a 90% left  proximal internal carotid artery stenosis and a 60% proximal right  internal carotid artery stenosis.  The left subclavian artery is noted  to be occluded.  There appears to be narrowing at the origin of the left  vertebral artery and mild narrowing at the origin of the right vertebral  artery with mild right subclavian stenosis.   IMPRESSION:  This patient presents with a symptomatic left carotid  stenosis that is at least 70% by duplex.  CT scan suggests a greater  severity of stenosis.  Given 2 previous episodes of left hemispheric  symptoms with right arm weakness and a significant left carotid  stenosis, I have recommended left carotid endarterectomy.  I do not  think she is an unacceptable risk because of her age.  She has no  significant cardiac history.  I also think it would be perfectly  reasonable to consider carotid stenting.  We have discussed the  indications for surgery and the potential complications including but  not limited to bleeding, stroke, (peri-procedural risk 1%-2%), MI, nerve  injury, or other unpredictable medical problems.  We have also discussed  the potential risks of carotid stenting.  She has refused to consider  this when she was in the hospital but will reconsider this with her  family.  Currently, she is not willing to schedule any further  intervention at this time.  I  have explained that if she does wish to  proceed with carotid stenting, I would be happy to set her up with Dr.  Darrick Penna or Dr. Myra Gianotti to discuss this further.  Until she makes a  decision, she knows to continue taking her aspirin and Plavix.  She  knows to let us know if she develops any new neurologic symptoms.  If  she elects to proceed with surgery, we will need to stop her Plavix 1  week preoperatively.  She will let us know in early January what she  elects to do.  If she elects not to proceed with any intervention, then  we will  plan on arranging a follow-up carotid duplex in 6 months.     Di Kindle. Edilia Bo, M.D.  Electronically Signed   CSD/MEDQ  D:  07/21/2010  T:  07/22/2010  Job:  [id number]   cc:   Vikki Ports A. Felicity Coyer, MD

## 2010-12-20 ENCOUNTER — Telehealth: Payer: Self-pay

## 2010-12-20 NOTE — Telephone Encounter (Signed)
i would recommended remaining on the plavix unless there is significant swelling or pain around the bruise - the brusie will take longer to heal when on plavix but it is important to protect against risk of stroke as well - call for OV if other questions so i can eval the bruise, etc - thanks

## 2010-12-20 NOTE — Telephone Encounter (Signed)
Pt called stating she bumped her leg over the weekend and now she has a large purple bruise. Bruise is not overly painful but pt is concerned about continuing Plavix right now, please advise.

## 2010-12-20 NOTE — Telephone Encounter (Signed)
Pt advised and will monitor for healing

## 2011-01-04 ENCOUNTER — Encounter: Payer: Self-pay | Admitting: Internal Medicine

## 2011-01-11 ENCOUNTER — Encounter: Payer: Self-pay | Admitting: Internal Medicine

## 2011-01-11 ENCOUNTER — Ambulatory Visit (INDEPENDENT_AMBULATORY_CARE_PROVIDER_SITE_OTHER): Payer: Medicare HMO | Admitting: Internal Medicine

## 2011-01-11 ENCOUNTER — Other Ambulatory Visit (INDEPENDENT_AMBULATORY_CARE_PROVIDER_SITE_OTHER): Payer: Medicare HMO

## 2011-01-11 DIAGNOSIS — R5381 Other malaise: Secondary | ICD-10-CM

## 2011-01-11 DIAGNOSIS — I10 Essential (primary) hypertension: Secondary | ICD-10-CM

## 2011-01-11 DIAGNOSIS — I739 Peripheral vascular disease, unspecified: Secondary | ICD-10-CM

## 2011-01-11 DIAGNOSIS — R5383 Other fatigue: Secondary | ICD-10-CM

## 2011-01-11 DIAGNOSIS — E785 Hyperlipidemia, unspecified: Secondary | ICD-10-CM

## 2011-01-11 LAB — CBC WITH DIFFERENTIAL/PLATELET
Basophils Absolute: 0 10*3/uL (ref 0.0–0.1)
Basophils Relative: 0.5 % (ref 0.0–3.0)
Eosinophils Absolute: 0.1 10*3/uL (ref 0.0–0.7)
Lymphocytes Relative: 22.6 % (ref 12.0–46.0)
MCHC: 32.2 g/dL (ref 30.0–36.0)
Monocytes Relative: 6.2 % (ref 3.0–12.0)
Neutrophils Relative %: 69.3 % (ref 43.0–77.0)
RBC: 4.15 Mil/uL (ref 3.87–5.11)
RDW: 17.9 % — ABNORMAL HIGH (ref 11.5–14.6)

## 2011-01-11 LAB — LIPID PANEL
HDL: 60.5 mg/dL (ref >39.00)
Total CHOL/HDL Ratio: 3
Triglycerides: 88 mg/dL (ref 0.0–149.0)
VLDL: 17.6 mg/dL (ref 0.0–40.0)

## 2011-01-11 LAB — HEPATIC FUNCTION PANEL
AST: 19 U/L (ref 0–37)
Alkaline Phosphatase: 82 U/L (ref 39–117)
Bilirubin, Direct: 0.1 mg/dL (ref 0.0–0.3)

## 2011-01-11 LAB — BASIC METABOLIC PANEL
CO2: 30 mEq/L (ref 19–32)
Calcium: 9.2 mg/dL (ref 8.4–10.5)
Creatinine, Ser: 0.7 mg/dL (ref 0.4–1.2)
GFR: 84.13 mL/min (ref >60.00)
Sodium: 139 mEq/L (ref 135–145)

## 2011-01-11 MED ORDER — PRAVASTATIN SODIUM 20 MG PO TABS: 20.0000 mg | ORAL_TABLET | Freq: Every day | ORAL | Status: DC

## 2011-01-11 NOTE — Assessment & Plan Note (Signed)
On statin - The current medical regimen is effective;  continue present plan and medications.  Check lipids now 

## 2011-01-11 NOTE — Progress Notes (Signed)
Subjective:    Patient ID: [name], female    DOB: 12/08/1921, 77 y.o.   MRN: [id number]  HPI Here for follow up - reviewed chonic med issues:  hx CVA 07/2010 -  R side weakness, improved -  known hx PVD -90% L ICA stenosis and possible right subclavian steal changes on angio  s/p consult with vasc x 2 for same after 07/2010 CVA  - vasc rec CEA but pt reports [place] doc prev rec against stent or cea   HTN - prev on amlodipine and bbloc - now bbloc alone since cva  reports compliance with ongoing medical treatment and no new changes in medication dose or frequency. denies adverse side effects related to current therapy.   dyslipidemia - reports compliance with ongoing medical treatment and no changes in medication dose or frequency.  denies adverse side effects related to current therapy.   Past Medical History  Diagnosis Date  . ARTHRITIS   . 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 CORONARY ARTERY DISEASE   . MACULAR DEGENERATION   . PERIPHERAL VASCULAR DISEASE   . Sebaceous cyst     L chin and R neck  . Subclavian steal syndrome   . HYPERTENSION   . HYPERLIPIDEMIA      Review of Systems  Eyes: Negative for visual disturbance.  Respiratory: Negative for shortness of breath.   Cardiovascular: Negative for chest pain.  Neurological: Negative for weakness and headaches.       Objective:   Physical Exam BP 126/82  Pulse 71  Temp(Src) 97.6 F (36.4 C) (Oral)  Ht 5' (1.524 m)  Wt 122 lb 1.9 oz (55.393 kg)  BMI 23.85 kg/m2  SpO2 95% Physical Exam  Constitutional: She is oriented to person, place, and time. She appears well-developed and well-nourished. No distress. Dtr at side Neck: Normal range of motion. Neck supple. No JVD present. No thyromegaly present. R bruit without Cardiovascular: Normal rate, regular rhythm and normal heart sounds.  No murmur heard. No BLE edema. Pulmonary/Chest: Effort normal and breath sounds normal. No respiratory distress.  She has no wheezes. Neurological: She is alert and oriented to person, place, and time. No cranial nerve deficit. Coordination normal. Psychiatric: She has a normal mood and affect. Her behavior is normal. Judgment and thought content normal.   Lab Results  Component Value Date   WBC 8.3 07/01/2010   HGB 13.1 07/01/2010   HCT 38.6 07/01/2010   PLT 278 07/01/2010   CHOL  Value: 173        ATP III CLASSIFICATION:  <200     mg/dL   Desirable  664-403  mg/dL   Borderline High  >=474    mg/dL   High        25/95/6387   TRIG 84 06/29/2010   HDL 50 06/29/2010   ALT 11 06/28/2010   AST 16 06/28/2010   NA 144 07/13/2010   K 4.6 07/13/2010   CL 107 07/13/2010   CREATININE 0.7 07/13/2010   BUN 19 07/13/2010   CO2 30 07/13/2010   TSH 2.864 06/29/2010   INR 0.96 06/28/2010   HGBA1C  Value: 6.0 (NOTE)  According to the ADA Clinical Practice Recommendations for 2011, when HbA1c is used as a screening test:   >=6.5%   Diagnostic of Diabetes Mellitus           (if abnormal result  is confirmed)  5.7-6.4%   Increased risk of developing Diabetes Mellitus  References:Diagnosis and Classification of Diabetes Mellitus,Diabetes Care,2011,34(Suppl 1):S62-S69 and Standards of Medical Care in         Diabetes - 2011,Diabetes Care,2011,34  (Suppl 1):S11-S61.* 06/28/2010          Assessment & Plan:  See problem list. Medications and labs reviewed today.

## 2011-01-11 NOTE — Assessment & Plan Note (Signed)
Off amlodipine since 07/2010- on Bbloc only The current medical regimen is effective;  continue present plan and medications. BP Readings from Last 3 Encounters:  01/11/11 126/82  10/12/10 136/82  07/13/10 122/72

## 2011-01-11 NOTE — Patient Instructions (Signed)
It was good to see you today. Medications reviewed, no changes at this time. Test(s) ordered today. Your results will be called to you after review (48-72hours after test completion). If any changes need to be made, you will be notified at that time. Refill on medication(s) as discussed today. Please schedule followup in 6 months, call sooner if problems.

## 2011-01-11 NOTE — Assessment & Plan Note (Signed)
90% left ICAS on angio - s/p vasc consult x 2 for this in 2011 and rec CEA, not stent -  pt remians concerned as prev eval in [place] rec "med mgmt" per pt - - explained this prev rec was prior to CVA event 07/2010 pt will cont to consider and f/u VVS if decides to proceed - planing to continue med mgmt

## 2011-01-12 ENCOUNTER — Other Ambulatory Visit (INDEPENDENT_AMBULATORY_CARE_PROVIDER_SITE_OTHER): Payer: Medicare HMO

## 2011-01-12 ENCOUNTER — Ambulatory Visit (INDEPENDENT_AMBULATORY_CARE_PROVIDER_SITE_OTHER): Payer: Medicare HMO | Admitting: Vascular Surgery

## 2011-01-12 DIAGNOSIS — I6529 Occlusion and stenosis of unspecified carotid artery: Secondary | ICD-10-CM

## 2011-01-13 NOTE — Assessment & Plan Note (Signed)
OFFICE VISIT  GUISELLE, MIAN DOB:  May 06, 1922                                       01/12/2011 EAVWU#:[id number]  I saw the patient in the office today for continued followup of her carotid disease.  I initially saw her in consultation in December 2011 with a 60% to 79% left carotid stenosis by duplex which was felt to be 90% by CTA.  She had had a left brain stroke, and we considered carotid endarterectomy versus carotid stenting.  She was evaluated by Dr. Darrick Penna concerning possible carotid stenting, and it was felt that she would do best with carotid endarterectomy.  Ultimately, the family elected not to proceed with intervention.  She comes in for a 52-month followup visit. Since I saw her last, she has had no history of new onset weakness or numbness in her upper or lower extremities.  She has had no expressive or receptive aphasia or amaurosis fugax.  PAST MEDICAL HISTORY:  Has not changed since December.  SOCIAL HISTORY:  She has a remote history of tobacco use.  REVIEW OF SYSTEMS:  CARDIOVASCULAR:  She has had no chest pain, chest pressure, palpitations or arrhythmias. PULMONARY:  She has had no productive cough, bronchitis, asthma or wheezing.  PHYSICAL EXAMINATION:  General:  This is a pleasant 77 year old woman who appears her stated age.  Vital Signs: Blood pressure is 140/73, heart rate is 61, saturation 95%.  Lungs:  Clear bilaterally to auscultation without rales, rhonchi or wheezing.  Cardiovascular Exam: She has bilateral carotid bruits.  She has a regular rate and rhythm. Abdomen:  Soft and nontender with normal pitched bowel sounds. Neurologic Exam:  She has no focal weakness or paresthesias.  I have independently interpreted her carotid duplex scan which shows a 60% to 79% left carotid stenosis with no significant stenosis on the right.  We again discussed potentially considering carotid endarterectomy in order to lower her risk of  future stroke.  Given that she had a stroke on the left with a 60% to 79% left carotid stenosis, her risk of stroke is approximately 5% to 10% per year; however, currently she still feels quite comfortable with the decision to not proceed with intervention, and given her age, I think this is very reasonable.  She is on Plavix and knows to continue her Plavix.  I have ordered a followup carotid duplex scan in 6 months, and I will see her back at that time.  She knows to call sooner if she has problems.    Di Kindle. Edilia Bo, M.D. Electronically Signed  CSD/MEDQ  D:  01/12/2011  T:  01/13/2011  Job:  [id number]

## 2011-01-20 NOTE — Procedures (Unsigned)
CAROTID DUPLEX EXAM  INDICATION:  Carotid disease.  HISTORY: Diabetes:  No. Cardiac:  No. Hypertension:  Yes. Smoking:  Previous. Previous Surgery:  No. CV History:  Currently asymptomatic. Amaurosis Fugax No, Paresthesias No, Hemiparesis No.                                      RIGHT             LEFT Brachial systolic pressure:         188               164 Brachial Doppler waveforms:         Normal            Dampened Vertebral direction of flow:        Antegrade         Bidirectional DUPLEX VELOCITIES (cm/sec) CCA peak systolic                   50                63 ECA peak systolic                   363               113 ICA peak systolic                   76                378 ICA end diastolic                   15                66 PLAQUE MORPHOLOGY:                  Calcific          Calcific PLAQUE AMOUNT:                      Moderate/severe   Severe PLAQUE LOCATION:                    ICA/ECA/CCA       ICA/ECA/CCA  IMPRESSION: 1. No hemodynamically significant stenosis of the right internal     carotid artery with plaque formations as described above.  Right     external carotid artery stenosis noted. 2. Doppler velocities suggest a high-end 60% to 79% stenosis of the     left internal carotid artery; however, the percentage of stenosis     may be underestimated due to calcific shadowing. 3. Bidirectional flow is noted in the left vertebral artery, which is     suggestive of left proximal subclavian artery disease.  ___________________________________________ Di Kindle. Edilia Bo, M.D.  CH/MEDQ  D:  01/12/2011  T:  01/12/2011  Job:  [id number]

## 2011-03-03 ENCOUNTER — Telehealth: Payer: Self-pay

## 2011-03-03 DIAGNOSIS — L602 Onychogryphosis: Secondary | ICD-10-CM

## 2011-03-03 NOTE — Telephone Encounter (Signed)
Pt advised and will expect a call from [facility] with appt info 

## 2011-03-03 NOTE — Telephone Encounter (Signed)
done

## 2011-03-03 NOTE — Telephone Encounter (Signed)
Pt called requesting a referral to Podiatry

## 2011-03-25 ENCOUNTER — Other Ambulatory Visit: Payer: Self-pay | Admitting: *Deleted

## 2011-03-25 MED ORDER — CLOPIDOGREL BISULFATE 75 MG PO TABS: 75.0000 mg | ORAL_TABLET | Freq: Every day | ORAL | Status: DC

## 2011-04-07 ENCOUNTER — Other Ambulatory Visit: Payer: Self-pay | Admitting: Internal Medicine

## 2011-07-13 ENCOUNTER — Ambulatory Visit: Payer: Medicare HMO | Admitting: Internal Medicine

## 2011-07-20 ENCOUNTER — Other Ambulatory Visit (INDEPENDENT_AMBULATORY_CARE_PROVIDER_SITE_OTHER): Payer: Medicare HMO | Admitting: *Deleted

## 2011-07-20 DIAGNOSIS — I6529 Occlusion and stenosis of unspecified carotid artery: Secondary | ICD-10-CM

## 2011-07-22 ENCOUNTER — Ambulatory Visit (INDEPENDENT_AMBULATORY_CARE_PROVIDER_SITE_OTHER): Payer: Medicare HMO | Admitting: Internal Medicine

## 2011-07-22 ENCOUNTER — Encounter: Payer: Self-pay | Admitting: Internal Medicine

## 2011-07-22 ENCOUNTER — Other Ambulatory Visit (INDEPENDENT_AMBULATORY_CARE_PROVIDER_SITE_OTHER): Payer: Medicare HMO

## 2011-07-22 DIAGNOSIS — Z23 Encounter for immunization: Secondary | ICD-10-CM

## 2011-07-22 DIAGNOSIS — E785 Hyperlipidemia, unspecified: Secondary | ICD-10-CM

## 2011-07-22 DIAGNOSIS — I10 Essential (primary) hypertension: Secondary | ICD-10-CM

## 2011-07-22 DIAGNOSIS — I739 Peripheral vascular disease, unspecified: Secondary | ICD-10-CM

## 2011-07-22 LAB — LIPID PANEL
Cholesterol: 172 mg/dL (ref 0–200)
VLDL: 21.8 mg/dL (ref 0.0–40.0)

## 2011-07-22 MED ORDER — CLOPIDOGREL BISULFATE 75 MG PO TABS: 75.0000 mg | ORAL_TABLET | Freq: Every day | ORAL | Status: DC

## 2011-07-22 NOTE — Progress Notes (Signed)
Subjective:    Patient ID: [name], female    DOB: [birth year], 77 y.o.   MRN: [id number]  HPI  Here for follow up - reviewed chonic med issues:  hx CVA 07/2010 -  R side weakness, improved -  known hx PVD -90% L ICA stenosis and possible right subclavian steal changes on angio  s/p consult with vasc x 2 for same after 07/2010 CVA  - vasc rec CEA but pt reports [place] doc prev rec against stent or cea   HTN - prev on amlodipine and bbloc - now bbloc alone since cva  reports compliance with ongoing medical treatment and no new changes in medication dose or frequency. denies adverse side effects related to current therapy.   dyslipidemia - reports compliance with ongoing medical treatment and no changes in medication dose or frequency. denies adverse side effects related to current therapy.   Past Medical History  Diagnosis Date  . ARTHRITIS   . 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 CORONARY ARTERY DISEASE   . MACULAR DEGENERATION   . PERIPHERAL VASCULAR DISEASE   . Sebaceous cyst     L chin and R neck  . Subclavian steal syndrome   . HYPERTENSION   . HYPERLIPIDEMIA      Review of Systems  Eyes: Negative for visual disturbance.  Respiratory: Negative for shortness of breath.   Cardiovascular: Negative for chest pain.  Neurological: Negative for weakness and headaches.       Objective:   Physical Exam  BP 112/82  Pulse 59  Temp(Src) 98 F (36.7 C) (Oral)  Wt 123 lb (55.792 kg)  SpO2 93% Wt Readings from Last 3 Encounters:  07/22/11 123 lb (55.792 kg)  01/11/11 122 lb 1.9 oz (55.393 kg)  10/12/10 117 lb (53.071 kg)   Constitutional: She appears well-developed and well-nourished. No distress.  Neck: Normal range of motion. Neck supple. No JVD present. No thyromegaly present. R bruit without change Cardiovascular: Normal rate, regular rhythm and normal heart sounds.  No murmur heard. No BLE edema. Pulmonary/Chest: Effort normal and breath  sounds normal. No respiratory distress. She has no wheezes. Neurological: She is alert and oriented to person, place, and time. No cranial nerve deficit. Coordination normal. Psychiatric: She has a normal mood and affect. Her behavior is normal. Judgment and thought content normal.   Lab Results  Component Value Date   WBC 7.5 01/11/2011   HGB 11.1* 01/11/2011   HCT 34.6* 01/11/2011   PLT 347.0 01/11/2011   CHOL 185 01/11/2011   TRIG 88.0 01/11/2011   HDL 60.50 01/11/2011   ALT 14 01/11/2011   AST 19 01/11/2011   NA 139 01/11/2011   K 4.2 01/11/2011   CL 106 01/11/2011   CREATININE 0.7 01/11/2011   BUN 18 01/11/2011   CO2 30 01/11/2011   TSH 2.65 01/11/2011   INR 0.96 06/28/2010   HGBA1C  Value: 6.0 (NOTE)                                                                       According to the ADA Clinical Practice Recommendations for 2011, when HbA1c is used as a screening test:   >=6.5%   Diagnostic  of Diabetes Mellitus           (if abnormal result  is confirmed)  5.7-6.4%   Increased risk of developing Diabetes Mellitus  References:Diagnosis and Classification of Diabetes Mellitus,Diabetes Care,2011,34(Suppl 1):S62-S69 and Standards of Medical Care in         Diabetes - 2011,Diabetes Care,2011,34  (Suppl 1):S11-S61.* 06/28/2010          Assessment & Plan:  See problem list. Medications and labs reviewed today.

## 2011-07-22 NOTE — Assessment & Plan Note (Signed)
On statin - The current medical regimen is effective;  continue present plan and medications.  Check lipids now 

## 2011-07-22 NOTE — Assessment & Plan Note (Signed)
Off amlodipine since 07/2010- on Bbloc only The current medical regimen is effective;  continue present plan and medications. BP Readings from Last 3 Encounters:  07/22/11 112/82  01/11/11 126/82  10/12/10 136/82

## 2011-07-22 NOTE — Assessment & Plan Note (Signed)
90% left ICAS on angio - s/p vasc consult x 2 for this in 2011 and rec CEA, not stent -  pt remains concerned as prior eval in [place] recommended "med mgmt" per pt - - explained this prev rec was prior to CVA event 07/2010 pt will cont to consider and f/u [facility]VS if decides to proceed - planing to continue med mgmt Doppler 07/2011 with [facility] - unchanged

## 2011-07-22 NOTE — Patient Instructions (Signed)
It was good to see you today. Medications reviewed, no changes at this time. Test(s) ordered today. Your results will be called to you after review (48-72hours after test completion). If any changes need to be made, you will be notified at that time. Refill on medication(s) as discussed today. Please schedule followup in 6 months, call sooner if problems.

## 2011-08-08 ENCOUNTER — Other Ambulatory Visit: Payer: Self-pay | Admitting: *Deleted

## 2011-08-08 DIAGNOSIS — I6529 Occlusion and stenosis of unspecified carotid artery: Secondary | ICD-10-CM

## 2011-08-15 NOTE — Procedures (Unsigned)
CAROTID DUPLEX EXAM  INDICATION:  Follow up carotid disease.  HISTORY: Diabetes:  No. Cardiac:  No. Hypertension:  Yes. Smoking:  Previous. Previous Surgery:  No. CV History: Amaurosis Fugax No, Paresthesias No, Hemiparesis No.                                      RIGHT             LEFT Brachial systolic pressure:         190               138 Brachial Doppler waveforms:         WNL               Dampened Vertebral direction of flow:        Abnormal antegrade                      Retrograde/bidirectional DUPLEX VELOCITIES (cm/sec) CCA peak systolic                   57                64 ECA peak systolic                   55                82 ICA peak systolic                   86                272 (P), 383 (M) ICA end diastolic                   24                71 (P), 50 (M) PLAQUE MORPHOLOGY:                  Heterogenous      Calcific PLAQUE AMOUNT:                      Moderate          Severe PLAQUE LOCATION:                    CCA/ECA/ICA       CCA/ECA/ICA  IMPRESSION: 1. 1% to 39% right internal carotid artery stenosis. 2. 60% to 79% left internal carotid artery stenosis by velocity;     however, calcific plaque prevents accurate evaluation.  Plaque     appears worse.  Velocities may be under-estimated. 3. Left subclavian steal syndrome with proximal stenosis and     retrograde left vertebral artery. 4. Right vertebral artery is antegrade but elevated, likely due to     compensatory flow. 5. Severe right external carotid artery stenosis is observed.      ___________________________________________ Di Kindle. Edilia Bo, M.D.  LT/MEDQ  D:  07/20/2011  T:  07/20/2011  Job:  [id number]

## 2011-08-16 ENCOUNTER — Encounter: Payer: Self-pay | Admitting: Vascular Surgery

## 2011-11-29 ENCOUNTER — Other Ambulatory Visit: Payer: Self-pay | Admitting: Internal Medicine

## 2012-01-17 ENCOUNTER — Encounter: Payer: Self-pay | Admitting: Neurosurgery

## 2012-01-18 ENCOUNTER — Other Ambulatory Visit (INDEPENDENT_AMBULATORY_CARE_PROVIDER_SITE_OTHER): Payer: Medicare Other | Admitting: *Deleted

## 2012-01-18 ENCOUNTER — Encounter: Payer: Self-pay | Admitting: Neurosurgery

## 2012-01-18 ENCOUNTER — Ambulatory Visit (INDEPENDENT_AMBULATORY_CARE_PROVIDER_SITE_OTHER): Payer: Medicare Other | Admitting: Neurosurgery

## 2012-01-18 VITALS — BP 138/80 | HR 68 | Resp 16 | Ht 62.0 in | Wt 112.0 lb

## 2012-01-18 DIAGNOSIS — I6529 Occlusion and stenosis of unspecified carotid artery: Secondary | ICD-10-CM

## 2012-01-18 NOTE — Progress Notes (Signed)
[facility] [place] Carotid Office Note  CC: Six-month carotid surveillance Referring Physician: Edilia Bo  History of Present Illness: 78 year old female patient of [name] for known carotid stenosis. The patient's had no intervention, she denies any signs or symptoms of CVA, TIA, amaurosis fugax or neural deficit. Left carotid velocities suggest 40-59% stenosis today, last time she was here one year ago that was read as 60-79%.  Past Medical History  Diagnosis Date  . ARTHRITIS   . 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 CORONARY ARTERY DISEASE   . MACULAR DEGENERATION   . PERIPHERAL VASCULAR DISEASE   . Sebaceous cyst     L chin and R neck  . Subclavian steal syndrome   . HYPERTENSION   . HYPERLIPIDEMIA     ROS: [x]  Positive   [ ]  Denies    General: [ ]  Weight loss, [ ]  Fever, [ ]  chills Neurologic: [ ]  Dizziness, [ ]  Blackouts, [ ]  Seizure [ ]  Stroke, [ ]  "Mini stroke", [ ]  Slurred speech, [ ]  Temporary blindness; [ ]  weakness in arms or legs, [ ]  Hoarseness Cardiac: [ ]  Chest pain/pressure, [ ]  Shortness of breath at rest [ ]  Shortness of breath with exertion, [ ]  Atrial fibrillation or irregular heartbeat Vascular: [ ]  Pain in legs with walking, [ ]  Pain in legs at rest, [ ]  Pain in legs at night,  [ ]  Non-healing ulcer, [ ]  Blood clot in vein/DVT,   Pulmonary: [ ]  Home oxygen, [ ]  Productive cough, [ ]  Coughing up blood, [ ]  Asthma,  [ ]  Wheezing Musculoskeletal:  [ ]  Arthritis, [ ]  Low back pain, [ ]  Joint pain Hematologic: [ ]  Easy Bruising, [ ]  Anemia; [ ]  Hepatitis Gastrointestinal: [ ]  Blood in stool, [ ]  Gastroesophageal Reflux/heartburn, [ ]  Trouble swallowing Urinary: [ ]  chronic Kidney disease, [ ]  on HD - [ ]  MWF or [ ]  TTHS, [ ]  Burning with urination, [ ]  Difficulty urinating Skin: [ ]  Rashes, [ ]  Wounds Psychological: [ ]  Anxiety, [ ]  Depression   Social History History  Substance Use Topics  . Smoking status:  Former Smoker    Quit date: 09/29/2009  . Smokeless tobacco: Not on file   Comment: Widowed, lives in [place] living. Moved to [place] from [place] fall 2011 to be near dtr  . Alcohol Use: Yes     Occasional    Family History Family History  Problem Relation Age of Onset  . Heart disease Other     Parent  . Miscarriages / Stillbirths Other     Allergies  Allergen Reactions  . Penicillins   . Sulfonamide Derivatives     Current Outpatient Prescriptions  Medication Sig Dispense Refill  . atenolol (TENORMIN) 50 MG tablet TAKE 1 BY MOUTH ONCE DAILY  90 tablet  1  . cholecalciferol (VITAMIN D) 1000 UNITS tablet Take 1,000 Units by mouth daily.        . clopidogrel (PLAVIX) 75 MG tablet Take 1 tablet (75 mg total) by mouth daily. Take 1 by mouth daily  90 tablet  1  . pravastatin (PRAVACHOL) 20 MG tablet TAKE 1 TABLET BY MOUTH EVERY DAY  90 tablet  1  . aspirin 81 MG tablet Take 81 mg by mouth daily.          Physical Examination  Filed Vitals:   01/18/12 1317  BP: 138/80  Pulse: 68  Resp: 16  Body mass index is 20.49 kg/(m^2).  General:  WDWN in NAD Gait: Normal HEENT: WNL Eyes: Pupils equal Pulmonary: normal non-labored breathing , without Rales, rhonchi,  wheezing Cardiac: RRR, without  Murmurs, rubs or gallops; Abdomen: soft, NT, no masses Skin: no rashes, ulcers noted  Vascular Exam Pulses:  2+ radial pulses bilateral Carotid bruits: Bilateral carotid bruits are heard left greater than right Extremities without ischemic changes, no Gangrene , no cellulitis; no open wounds;  Musculoskeletal: no muscle wasting or atrophy   Neurologic: A&O X 3; Appropriate Affect ; SENSATION: normal; MOTOR FUNCTION:  moving all extremities equally. Speech is fluent/normal  Non-Invasive Vascular Imaging CAROTID DUPLEX 01/18/2012  Right ICA 20 - 39 % stenosis Left ICA 40 - 59 % stenosis   ASSESSMENT/PLAN: Asymptomatic patient with bilateral carotid stenosis and left greater than  right, the patient will followup here in 6 months for repeat carotid duplex. Her questions were encouraged and answered. The patient understands the signs and symptoms of CVA and knows to report to the nearest emergency department should that occur.  Lauree Chandler ANP     Clinic MD: Edilia Bo

## 2012-01-24 ENCOUNTER — Ambulatory Visit (INDEPENDENT_AMBULATORY_CARE_PROVIDER_SITE_OTHER): Payer: Medicare Other | Admitting: Internal Medicine

## 2012-01-24 ENCOUNTER — Encounter: Payer: Self-pay | Admitting: Internal Medicine

## 2012-01-24 VITALS — BP 132/80 | HR 59 | Temp 97.6°F | Ht 60.0 in | Wt 125.0 lb

## 2012-01-24 DIAGNOSIS — I10 Essential (primary) hypertension: Secondary | ICD-10-CM

## 2012-01-24 DIAGNOSIS — H3530 Unspecified macular degeneration: Secondary | ICD-10-CM

## 2012-01-24 DIAGNOSIS — I739 Peripheral vascular disease, unspecified: Secondary | ICD-10-CM

## 2012-01-24 DIAGNOSIS — E785 Hyperlipidemia, unspecified: Secondary | ICD-10-CM

## 2012-01-24 NOTE — Assessment & Plan Note (Signed)
Off amlodipine since 07/2010- on Bbloc only The current medical regimen is effective;  continue present plan and medications. BP Readings from Last 3 Encounters:  01/24/12 132/80  01/18/12 138/80  07/22/11 112/82

## 2012-01-24 NOTE — Assessment & Plan Note (Signed)
90% left ICAS on angio - s/p vasc consult x 2 for this in 2011 and rec CEA, not stent -  pt remains concerned as prior eval in [place] recommended "med mgmt" per pt - - explained this prev rec was prior to CVA event 07/2010 pt will cont to consider and f/u VVS if decides to proceed - planing to continue med mgmt Doppler 07/2011 and 12/2011 with vvs - improved

## 2012-01-24 NOTE — Assessment & Plan Note (Signed)
On statin - The current medical regimen is effective;  continue present plan and medications.  

## 2012-01-24 NOTE — Patient Instructions (Signed)
It was good to see you today. Medications reviewed, no changes at this time. Please schedule followup in 6 months, call sooner if problems. Will check cholesterol next visit

## 2012-01-24 NOTE — Progress Notes (Signed)
Subjective:    Patient ID: [name], female    DOB: 05/28/1922, 78 y.o.   MRN: [id number]  HPI  Here for follow up - reviewed chronic med issues:  hx CVA 07/2010 -  R side weakness, improved -  known hx PVD -90% L ICA stenosis and possible right subclavian steal changes on angio  s/p consult with vasc x 2 for same after 07/2010 CVA  -  vasc recommended CEA in 2011 but pt reports [place] doc prev rec against stent or cea   hypertension - prev on amlodipine and bbloc - now bbloc alone since cva  reports compliance with ongoing medical treatment and no new changes in medication dose or frequency. denies adverse side effects related to current therapy.   dyslipidemia - reports compliance with ongoing medical treatment and no changes in medication dose or frequency. denies adverse side effects related to current therapy.   Past Medical History  Diagnosis Date  . ARTHRITIS   . 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 CORONARY ARTERY DISEASE   . MACULAR DEGENERATION   . PERIPHERAL VASCULAR DISEASE   . Sebaceous cyst     L chin and R neck  . Subclavian steal syndrome   . HYPERTENSION   . HYPERLIPIDEMIA      Review of Systems  Constitutional: Negative for fever and fatigue.  Eyes: Negative for photophobia and visual disturbance.  Respiratory: Negative for shortness of breath and wheezing.   Cardiovascular: Negative for chest pain and palpitations.  Neurological: Negative for weakness and headaches.       Objective:   Physical Exam  BP 132/80  Pulse 59  Temp 97.6 F (36.4 C) (Oral)  Ht 5' (1.524 m)  Wt 125 lb (56.7 kg)  BMI 24.41 kg/m2  SpO2 97% Wt Readings from Last 3 Encounters:  01/24/12 125 lb (56.7 kg)  01/18/12 112 lb (50.803 kg)  07/22/11 123 lb (55.792 kg)   Constitutional: She appears well-developed and well-nourished. No distress.  Neck: Normal range of motion. Neck supple. No JVD present. No thyromegaly present. R bruit without  change Cardiovascular: Normal rate, regular rhythm and normal heart sounds.  No murmur heard. No BLE edema. Pulmonary/Chest: Effort normal and breath sounds normal. No respiratory distress. She has no wheezes. Neurological: She is alert and oriented to person, place, and time. No cranial nerve deficit. Coordination normal. Psychiatric: She has a normal mood and affect. Her behavior is normal. Judgment and thought content normal.   Lab Results  Component Value Date   WBC 7.5 01/11/2011   HGB 11.1* 01/11/2011   HCT 34.6* 01/11/2011   PLT 347.0 01/11/2011   CHOL 172 07/22/2011   TRIG 109.0 07/22/2011   HDL 57.70 07/22/2011   ALT 14 01/11/2011   AST 19 01/11/2011   NA 139 01/11/2011   K 4.2 01/11/2011   CL 106 01/11/2011   CREATININE 0.7 01/11/2011   BUN 18 01/11/2011   CO2 30 01/11/2011   TSH 2.65 01/11/2011   INR 0.96 06/28/2010   HGBA1C  Value: 6.0 (NOTE)                                                                     06/28/2010  Carotid US 12/2011 - L ICAS 40-59% (improved from 07/2011 60-79%)      Assessment & Plan:  See problem list. Medications and labs reviewed today.

## 2012-01-24 NOTE — Assessment & Plan Note (Signed)
Stable vision deficits - considering new optho opinion Support offered

## 2012-01-25 NOTE — Procedures (Unsigned)
CAROTID DUPLEX EXAM  INDICATION:  Follow up carotid disease.  HISTORY: Diabetes:  No. Cardiac:  No. Hypertension:  Yes. Smoking:  Previous. Previous Surgery:  No. CV History:  Currently asymptomatic. Amaurosis Fugax No, Paresthesias No, Hemiparesis No                                      RIGHT             LEFT Brachial systolic pressure:         220               140 Brachial Doppler waveforms:         Normal            Dampened Vertebral direction of flow:        Antegrade         Retrograde DUPLEX VELOCITIES (cm/sec) CCA peak systolic                   34                50 ECA peak systolic                   579               180 ICA peak systolic                   81                286 ICA end diastolic                   20                41 PLAQUE MORPHOLOGY:                  Heterogenous      Calcific PLAQUE AMOUNT:                      Moderate/severe   Severe PLAQUE LOCATION:                    CCA, ECA, ICA     CCA, ICA, ECA  IMPRESSION: 1. Right internal carotid artery velocity suggests 1-39% stenosis. 2. Right external carotid artery severe stenosis present. 3. Left internal carotid artery velocity suggests 40-59% stenosis;     however, calcific plaque prevents accurate evaluation.  Plaque     appears worse. 4. Velocities may be underestimated due to calcific plaque with     acoustic shadowing. 5. Left vertebral artery is retrograde. 6. Right vertebral artery is elevated, most likely due to compensatory     flow.  ___________________________________________ Di Kindle. Edilia Bo, M.D.  EM/MEDQ  D:  01/18/2012  T:  01/18/2012  Job:  [id number]

## 2012-03-08 ENCOUNTER — Other Ambulatory Visit: Payer: Self-pay | Admitting: Internal Medicine

## 2012-05-23 ENCOUNTER — Other Ambulatory Visit: Payer: Self-pay | Admitting: Internal Medicine

## 2012-06-14 ENCOUNTER — Other Ambulatory Visit: Payer: Self-pay | Admitting: Internal Medicine

## 2012-07-01 DIAGNOSIS — M810 Age-related osteoporosis without current pathological fracture: Secondary | ICD-10-CM

## 2012-07-01 HISTORY — DX: Age-related osteoporosis without current pathological fracture: M81.0

## 2012-07-12 ENCOUNTER — Encounter: Payer: Self-pay | Admitting: Neurosurgery

## 2012-07-12 ENCOUNTER — Other Ambulatory Visit (INDEPENDENT_AMBULATORY_CARE_PROVIDER_SITE_OTHER): Payer: Medicare Other | Admitting: *Deleted

## 2012-07-12 ENCOUNTER — Ambulatory Visit (INDEPENDENT_AMBULATORY_CARE_PROVIDER_SITE_OTHER): Payer: Medicare Other | Admitting: Neurosurgery

## 2012-07-12 VITALS — BP 142/90 | HR 78 | Resp 18 | Ht 62.0 in | Wt 128.0 lb

## 2012-07-12 DIAGNOSIS — I6529 Occlusion and stenosis of unspecified carotid artery: Secondary | ICD-10-CM

## 2012-07-12 NOTE — Progress Notes (Signed)
[facility] [place] Carotid Office Note  CC: Carotid surveillance Referring Physician: Edilia Brooks  History of Present Illness: 78 year old female patient of Dr. Edilia Brooks with no history of carotid intervention. The patient denies any signs or symptoms of CVA, TIA, amaurosis fugax or any neural deficit. The patient denies any new medical diagnoses or recent surgery.  Past Medical History  Diagnosis Date  . ARTHRITIS   . 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 CORONARY ARTERY DISEASE   . MACULAR DEGENERATION   . PERIPHERAL VASCULAR DISEASE   . Sebaceous cyst     L chin and R neck  . Subclavian steal syndrome   . HYPERTENSION   . HYPERLIPIDEMIA   . Cyst of breast 2007    cyst on breast    ROS: [x]  Positive   [ ]  Denies    General: [ ]  Weight loss, [ ]  Fever, [ ]  chills Neurologic: [ ]  Dizziness, [ ]  Blackouts, [ ]  Seizure [ ]  Stroke, [ ]  "Mini stroke", [ ]  Slurred speech, [ ]  Temporary blindness; [ ]  weakness in arms or legs, [ ]  Hoarseness Cardiac: [ ]  Chest pain/pressure, [ ]  Shortness of breath at rest [ ]  Shortness of breath with exertion, [ ]  Atrial fibrillation or irregular heartbeat Vascular: [ ]  Pain in legs with walking, [ ]  Pain in legs at rest, [ ]  Pain in legs at night,  [ ]  Non-healing ulcer, [ ]  Blood clot in vein/DVT,   Pulmonary: [ ]  Home oxygen, [ ]  Productive cough, [ ]  Coughing up blood, [ ]  Asthma,  [ ]  Wheezing Musculoskeletal:  [ ]  Arthritis, [ ]  Low back pain, [ ]  Joint pain Hematologic: [ ]  Easy Bruising, [ ]  Anemia; [ ]  Hepatitis Gastrointestinal: [ ]  Blood in stool, [ ]  Gastroesophageal Reflux/heartburn, [ ]  Trouble swallowing Urinary: [ ]  chronic Kidney disease, [ ]  on HD - [ ]  MWF or [ ]  TTHS, [ ]  Burning with urination, [ ]  Difficulty urinating Skin: [ ]  Rashes, [ ]  Wounds Psychological: [ ]  Anxiety, [ ]  Depression   Social History History  Substance Use Topics  . Smoking status: Former Smoker    Quit date:  09/29/2009  . Smokeless tobacco: Never Used     Comment: Widowed, lives in [place] living. Moved to [place] from [place] fall 2011 to be near dtr  . Alcohol Use: Yes     Comment: Occasional    Family History Family History  Problem Relation Age of Onset  . Heart disease Other     Parent  . Miscarriages / Stillbirths Other   . Heart attack Father   . Cancer Mother   . Hyperlipidemia Brother   . Hypertension Brother     Allergies  Allergen Reactions  . Penicillins   . Sulfonamide Derivatives     Current Outpatient Prescriptions  Medication Sig Dispense Refill  . atenolol (TENORMIN) 50 MG tablet TAKE 1 BY MOUTH ONCE DAILY  90 tablet  1  . cholecalciferol (VITAMIN D) 1000 UNITS tablet Take 2,000 Units by mouth 2 (two) times daily.       . clopidogrel (PLAVIX) 75 MG tablet TAKE 1 TABLET BY MOUTH EVERY DAY  90 tablet  1  . pravastatin (PRAVACHOL) 20 MG tablet TAKE 1 TABLET BY MOUTH EVERY DAY  90 tablet  1    Physical Examination  Filed Vitals:   07/12/12 1212  BP: 142/90  Pulse: 78  Resp:  Body mass index is 23.41 kg/(m^2).  General:  WDWN in NAD Gait: Normal HEENT: WNL Eyes: Pupils equal Pulmonary: normal non-labored breathing , without Rales, rhonchi,  wheezing Cardiac: RRR, without  Murmurs, rubs or gallops; Abdomen: soft, NT, no masses Skin: no rashes, ulcers noted  Vascular Exam Pulses: 3+ radial pulses bilaterally Carotid bruits : Mild carotid bruits heard bilaterally Extremities without ischemic changes, no Gangrene , no cellulitis; no open wounds;  Musculoskeletal: no muscle wasting or atrophy   Neurologic: A&O X 3; Appropriate Affect ; SENSATION: normal; MOTOR FUNCTION:  moving all extremities equally. Speech is fluent/normal  Non-Invasive Vascular Imaging CAROTID DUPLEX 07/12/2012  Right ICA 20 - 39 % stenosis Left ICA 40 - 59 % stenosis   ASSESSMENT/PLAN: Asymptomatic patient that has a history of left ICA stenosis in the 60s 79% range however for  the last 2 duplexes she's been in the 40-59% range therefore we will move her one-year for repeat carotid duplex at her request. The patient's questions were encouraged and answered, she is in agreement with this plan.  Lauree Chandler ANP  Clinic MD: Darrick Penna

## 2012-07-17 ENCOUNTER — Encounter: Payer: Self-pay | Admitting: Internal Medicine

## 2012-07-17 ENCOUNTER — Ambulatory Visit (INDEPENDENT_AMBULATORY_CARE_PROVIDER_SITE_OTHER): Payer: Medicare Other | Admitting: Internal Medicine

## 2012-07-17 ENCOUNTER — Other Ambulatory Visit (INDEPENDENT_AMBULATORY_CARE_PROVIDER_SITE_OTHER): Payer: Medicare Other

## 2012-07-17 VITALS — BP 132/84 | HR 72 | Temp 97.8°F | Ht 60.0 in | Wt 129.0 lb

## 2012-07-17 DIAGNOSIS — I739 Peripheral vascular disease, unspecified: Secondary | ICD-10-CM

## 2012-07-17 DIAGNOSIS — I10 Essential (primary) hypertension: Secondary | ICD-10-CM

## 2012-07-17 DIAGNOSIS — E785 Hyperlipidemia, unspecified: Secondary | ICD-10-CM

## 2012-07-17 DIAGNOSIS — R5383 Other fatigue: Secondary | ICD-10-CM

## 2012-07-17 DIAGNOSIS — R5381 Other malaise: Secondary | ICD-10-CM

## 2012-07-17 LAB — CBC WITH DIFFERENTIAL/PLATELET
Basophils Absolute: 0 10*3/uL (ref 0.0–0.1)
Basophils Relative: 0.3 % (ref 0.0–3.0)
Eosinophils Absolute: 0 10*3/uL (ref 0.0–0.7)
HCT: 35.6 % — ABNORMAL LOW (ref 36.0–46.0)
Hemoglobin: 11.4 g/dL — ABNORMAL LOW (ref 12.0–15.0)
Lymphs Abs: 1.8 10*3/uL (ref 0.7–4.0)
MCHC: 32 g/dL (ref 30.0–36.0)
Neutro Abs: 6.3 10*3/uL (ref 1.4–7.7)
RDW: 16.6 % — ABNORMAL HIGH (ref 11.5–14.6)

## 2012-07-17 LAB — LIPID PANEL
HDL: 49.5 mg/dL (ref >39.00)
Total CHOL/HDL Ratio: 3
Triglycerides: 106 mg/dL (ref 0.0–149.0)
VLDL: 21.2 mg/dL (ref 0.0–40.0)

## 2012-07-17 LAB — BASIC METABOLIC PANEL
Calcium: 9.5 mg/dL (ref 8.4–10.5)
GFR: 82.47 mL/min (ref >60.00)
Sodium: 138 mEq/L (ref 135–145)

## 2012-07-17 LAB — HEPATIC FUNCTION PANEL
ALT: 13 U/L (ref 0–35)
AST: 16 U/L (ref 0–37)
Albumin: 4.1 g/dL (ref 3.5–5.2)

## 2012-07-17 NOTE — Assessment & Plan Note (Signed)
Off amlodipine since 07/2010- on Bbloc only The current medical regimen is effective;  continue present plan and medications. BP Readings from Last 3 Encounters:  07/17/12 132/84  07/12/12 142/90  01/24/12 132/80

## 2012-07-17 NOTE — Patient Instructions (Signed)
It was good to see you today. Medications reviewed, no changes at this time. Test(s) ordered today. Your results will be released to MyChart (or called to you) after review, usually within 72hours after test completion. If any changes need to be made, you will be notified at that same time. Please schedule followup in 6 months, call sooner if problems.   

## 2012-07-17 NOTE — Assessment & Plan Note (Signed)
90% left ICAS on angio - s/p vasc consult x 2 for this in 2011 and rec CEA, not stent -  pt remains concerned as prior eval in [place] recommended "med mgmt" per pt - - explained this prev rec was prior to CVA event 07/2010 pt will cont to consider and f/u VVS if decides to proceed - planing to continue med mgmt Doppler 07/2011, 12/2011, 07/2012 with [facility] - improved to 40-59% - stable - follow up in 1 year

## 2012-07-17 NOTE — Assessment & Plan Note (Signed)
On statin - The current medical regimen is effective;  continue present plan and medications.  

## 2012-07-17 NOTE — Progress Notes (Signed)
Subjective:    Patient ID: [name], female    DOB: 09/17/1921, 78 y.o.   MRN: [id number]  HPI  Here for follow up - reviewed chronic med issues:  hx CVA 07/2010 -  R side weakness, improved -  known hx PVD -90% L ICA stenosis and possible right subclavian steal changes on angio  s/p consult with vasc x 2 for same after 07/2010 CVA  -  vasc recommended CEA in 2011 but pt reports [place] doc prev rec against stent or cea   hypertension - prev on amlodipine and bbloc - now bbloc alone since cva  reports compliance with ongoing medical treatment and no new changes in medication dose or frequency. denies adverse side effects related to current therapy.   dyslipidemia - reports compliance with ongoing medical treatment and no changes in medication dose or frequency. denies adverse side effects related to current therapy.   Past Medical History  Diagnosis Date  . ARTHRITIS   . 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 CORONARY ARTERY DISEASE   . MACULAR DEGENERATION   . PERIPHERAL VASCULAR DISEASE   . Sebaceous cyst     L chin and R neck  . Subclavian steal syndrome   . HYPERTENSION   . HYPERLIPIDEMIA   . Cyst of breast 2007    cyst on breast     Review of Systems  Constitutional: Negative for fever and fatigue.  Eyes: Negative for photophobia and visual disturbance.  Respiratory: Negative for shortness of breath and wheezing.   Cardiovascular: Negative for chest pain and palpitations.  Neurological: Negative for weakness and headaches.       Objective:   Physical Exam  BP 132/84  Pulse 72  Temp 97.8 F (36.6 C) (Oral)  Ht 5' (1.524 m)  Wt 129 lb (58.514 kg)  BMI 25.19 kg/m2  SpO2 95% Wt Readings from Last 3 Encounters:  07/17/12 129 lb (58.514 kg)  07/12/12 128 lb (58.06 kg)  01/24/12 125 lb (56.7 kg)   Constitutional: She appears well-developed and well-nourished. No distress.  Neck: Normal range of motion. Neck supple. No JVD present. No  thyromegaly present. R bruit without change Cardiovascular: Normal rate, regular rhythm and normal heart sounds.  No murmur heard. No BLE edema. Pulmonary/Chest: Effort normal and breath sounds normal. No respiratory distress. She has no wheezes. Neurological: She is alert and oriented to person, place, and time. No cranial nerve deficit. Coordination normal. Psychiatric: She has a normal mood and affect. Her behavior is normal. Judgment and thought content normal.   Lab Results  Component Value Date   WBC 7.5 01/11/2011   HGB 11.1* 01/11/2011   HCT 34.6* 01/11/2011   PLT 347.0 01/11/2011   GLUCOSE 108* 01/11/2011   CHOL 172 07/22/2011   TRIG 109.0 07/22/2011   HDL 57.70 07/22/2011   LDLCALC 93 07/22/2011   ALT 14 01/11/2011   AST 19 01/11/2011   NA 139 01/11/2011   K 4.2 01/11/2011   CL 106 01/11/2011   CREATININE 0.7 01/11/2011   BUN 18 01/11/2011   CO2 30 01/11/2011   TSH 2.65 01/11/2011   INR 0.96 06/28/2010   HGBA1C  Value: 6.0 (NOTE)  According to the ADA Clinical Practice Recommendations for 2011, when HbA1c is used as a screening test:   >=6.5%   Diagnostic of Diabetes Mellitus           (if abnormal result  is confirmed)  5.7-6.4%   Increased risk of developing Diabetes Mellitus  References:Diagnosis and Classification of Diabetes Mellitus,Diabetes Care,2011,34(Suppl 1):S62-S69 and Standards of Medical Care in         Diabetes - 2011,Diabetes Care,2011,34  (Suppl 1):S11-S61.* 06/28/2010    Carotid US 12/2011 - L ICAS 40-59% (improved from 07/2011 60-79%)      Assessment & Plan:  See problem list. Medications and labs reviewed today.  Fatigue - nonspecific symptoms/exam - check screening labs

## 2012-07-19 ENCOUNTER — Other Ambulatory Visit: Payer: Medicare Other

## 2012-07-19 ENCOUNTER — Ambulatory Visit: Payer: Medicare Other | Admitting: Neurosurgery

## 2012-07-23 ENCOUNTER — Emergency Department (HOSPITAL_COMMUNITY): Payer: Medicare Other

## 2012-07-23 ENCOUNTER — Encounter (HOSPITAL_COMMUNITY): Payer: Self-pay | Admitting: Emergency Medicine

## 2012-07-23 ENCOUNTER — Inpatient Hospital Stay (HOSPITAL_COMMUNITY)
Admission: EM | Admit: 2012-07-23 | Discharge: 2012-07-26 | DRG: 552 | Disposition: A | Discharge status: Home Health | Payer: Medicare Other | Attending: Internal Medicine | Admitting: Internal Medicine

## 2012-07-23 DIAGNOSIS — E860 Dehydration: Secondary | ICD-10-CM

## 2012-07-23 DIAGNOSIS — I2510 Atherosclerotic heart disease of native coronary artery without angina pectoris: Secondary | ICD-10-CM

## 2012-07-23 DIAGNOSIS — R7989 Other specified abnormal findings of blood chemistry: Secondary | ICD-10-CM

## 2012-07-23 DIAGNOSIS — Z87891 Personal history of nicotine dependence: Secondary | ICD-10-CM

## 2012-07-23 DIAGNOSIS — I248 Other forms of acute ischemic heart disease: Secondary | ICD-10-CM | POA: Diagnosis present

## 2012-07-23 DIAGNOSIS — M549 Dorsalgia, unspecified: Secondary | ICD-10-CM | POA: Diagnosis present

## 2012-07-23 DIAGNOSIS — R748 Abnormal levels of other serum enzymes: Secondary | ICD-10-CM | POA: Diagnosis present

## 2012-07-23 DIAGNOSIS — I6529 Occlusion and stenosis of unspecified carotid artery: Secondary | ICD-10-CM | POA: Diagnosis present

## 2012-07-23 DIAGNOSIS — S335XXA Sprain of ligaments of lumbar spine, initial encounter: Secondary | ICD-10-CM | POA: Diagnosis present

## 2012-07-23 DIAGNOSIS — D72829 Elevated white blood cell count, unspecified: Secondary | ICD-10-CM | POA: Diagnosis present

## 2012-07-23 DIAGNOSIS — I10 Essential (primary) hypertension: Secondary | ICD-10-CM | POA: Diagnosis present

## 2012-07-23 DIAGNOSIS — R0902 Hypoxemia: Secondary | ICD-10-CM

## 2012-07-23 DIAGNOSIS — Z7902 Long term (current) use of antithrombotics/antiplatelets: Secondary | ICD-10-CM

## 2012-07-23 DIAGNOSIS — I2489 Other forms of acute ischemic heart disease: Secondary | ICD-10-CM | POA: Diagnosis present

## 2012-07-23 DIAGNOSIS — X500XXA Overexertion from strenuous movement or load, initial encounter: Secondary | ICD-10-CM | POA: Diagnosis present

## 2012-07-23 DIAGNOSIS — I259 Chronic ischemic heart disease, unspecified: Secondary | ICD-10-CM | POA: Diagnosis present

## 2012-07-23 DIAGNOSIS — S22009A Unspecified fracture of unspecified thoracic vertebra, initial encounter for closed fracture: Principal | ICD-10-CM | POA: Diagnosis present

## 2012-07-23 DIAGNOSIS — E785 Hyperlipidemia, unspecified: Secondary | ICD-10-CM | POA: Diagnosis present

## 2012-07-23 DIAGNOSIS — H3530 Unspecified macular degeneration: Secondary | ICD-10-CM | POA: Diagnosis present

## 2012-07-23 DIAGNOSIS — S239XXA Sprain of unspecified parts of thorax, initial encounter: Secondary | ICD-10-CM | POA: Diagnosis present

## 2012-07-23 DIAGNOSIS — R9431 Abnormal electrocardiogram [ECG] [EKG]: Secondary | ICD-10-CM | POA: Diagnosis present

## 2012-07-23 DIAGNOSIS — Z66 Do not resuscitate: Secondary | ICD-10-CM | POA: Diagnosis present

## 2012-07-23 DIAGNOSIS — Z79899 Other long term (current) drug therapy: Secondary | ICD-10-CM

## 2012-07-23 DIAGNOSIS — Y92009 Unspecified place in unspecified non-institutional (private) residence as the place of occurrence of the external cause: Secondary | ICD-10-CM

## 2012-07-23 LAB — CBC WITH DIFFERENTIAL/PLATELET
Basophils Relative: 0 % (ref 0–1)
Eosinophils Absolute: 0 10*3/uL (ref 0.0–0.7)
Eosinophils Relative: 0 % (ref 0–5)
Hemoglobin: 11.5 g/dL — ABNORMAL LOW (ref 12.0–15.0)
MCH: 26.4 pg (ref 26.0–34.0)
MCHC: 32.4 g/dL (ref 30.0–36.0)
MCV: 81.6 fL (ref 78.0–100.0)
Monocytes Relative: 6 % (ref 3–12)
Neutrophils Relative %: 86 % — ABNORMAL HIGH (ref 43–77)
Platelets: 345 10*3/uL (ref 150–400)

## 2012-07-23 LAB — BASIC METABOLIC PANEL
BUN: 32 mg/dL — ABNORMAL HIGH (ref 6–23)
Chloride: 105 mEq/L (ref 96–112)
GFR calc Af Amer: 74 mL/min — ABNORMAL LOW (ref >90)
GFR calc non Af Amer: 64 mL/min — ABNORMAL LOW (ref >90)
Potassium: 3.7 mEq/L (ref 3.5–5.1)

## 2012-07-23 LAB — URINALYSIS, ROUTINE W REFLEX MICROSCOPIC
Bilirubin Urine: NEGATIVE
Nitrite: NEGATIVE
Specific Gravity, Urine: >1.046 — ABNORMAL HIGH (ref 1.005–1.030)
Urobilinogen, UA: 0.2 mg/dL (ref 0.0–1.0)

## 2012-07-23 LAB — URINE MICROSCOPIC-ADD ON

## 2012-07-23 MED ORDER — SODIUM CHLORIDE 0.45 % IV SOLN
INTRAVENOUS | Status: AC
  Administered 2012-07-23: 16:00:00 via INTRAVENOUS

## 2012-07-23 MED ORDER — FLUTICASONE PROPIONATE HFA 44 MCG/ACT IN AERO
2.0000 | INHALATION_SPRAY | Freq: Two times a day (BID) | RESPIRATORY_TRACT | Status: DC
  Filled 2012-07-23: qty 10.6

## 2012-07-23 MED ORDER — IPRATROPIUM-ALBUTEROL 18-103 MCG/ACT IN AERO
2.0000 | INHALATION_SPRAY | Freq: Four times a day (QID) | RESPIRATORY_TRACT | Status: DC
  Filled 2012-07-23: qty 14.7

## 2012-07-23 MED ORDER — HYDROCODONE-ACETAMINOPHEN 5-325 MG PO TABS
1.0000 | ORAL_TABLET | Freq: Once | ORAL | Status: AC
  Administered 2012-07-23: 1 via ORAL
  Filled 2012-07-23: qty 1

## 2012-07-23 MED ORDER — ACETAMINOPHEN 325 MG PO TABS: 650.0000 mg | ORAL_TABLET | Freq: Four times a day (QID) | ORAL | Status: DC | PRN

## 2012-07-23 MED ORDER — ATENOLOL 50 MG PO TABS
50.0000 mg | ORAL_TABLET | Freq: Every day | ORAL | Status: DC
  Administered 2012-07-23: 50 mg via ORAL
  Filled 2012-07-23: qty 1

## 2012-07-23 MED ORDER — ASPIRIN 325 MG PO TABS
325.0000 mg | ORAL_TABLET | Freq: Every day | ORAL | Status: DC
  Filled 2012-07-23: qty 1

## 2012-07-23 MED ORDER — SODIUM CHLORIDE 0.9 % IJ SOLN: 3.0000 mL | INTRAMUSCULAR | Status: DC | PRN

## 2012-07-23 MED ORDER — HYDRALAZINE HCL 20 MG/ML IJ SOLN
INTRAMUSCULAR | Status: AC
  Administered 2012-07-23: 10 mg via INTRAVENOUS
  Filled 2012-07-23: qty 1

## 2012-07-23 MED ORDER — IOHEXOL 350 MG/ML SOLN
100.0000 mL | Freq: Once | INTRAVENOUS | Status: AC | PRN
  Administered 2012-07-23: 100 mL via INTRAVENOUS

## 2012-07-23 MED ORDER — MORPHINE SULFATE 4 MG/ML IJ SOLN
4.0000 mg | Freq: Once | INTRAMUSCULAR | Status: AC
  Administered 2012-07-23: 4 mg via INTRAVENOUS
  Filled 2012-07-23: qty 1

## 2012-07-23 MED ORDER — SODIUM CHLORIDE 0.9 % IJ SOLN: 3.0000 mL | Freq: Two times a day (BID) | INTRAMUSCULAR | Status: DC

## 2012-07-23 MED ORDER — ENOXAPARIN SODIUM 40 MG/0.4ML ~~LOC~~ SOLN
40.0000 mg | SUBCUTANEOUS | Status: DC
  Filled 2012-07-23 (×4): qty 0.4

## 2012-07-23 MED ORDER — ACETAMINOPHEN 650 MG RE SUPP: 650.0000 mg | Freq: Four times a day (QID) | RECTAL | Status: DC | PRN

## 2012-07-23 MED ORDER — IPRATROPIUM-ALBUTEROL 20-100 MCG/ACT IN AERS
1.0000 | INHALATION_SPRAY | Freq: Four times a day (QID) | RESPIRATORY_TRACT | Status: DC
  Filled 2012-07-23: qty 4

## 2012-07-23 MED ORDER — SODIUM CHLORIDE 0.9 % IV SOLN: 250.0000 mL | INTRAVENOUS | Status: DC | PRN

## 2012-07-23 MED ORDER — SODIUM CHLORIDE 0.9 % IJ SOLN
3.0000 mL | Freq: Two times a day (BID) | INTRAMUSCULAR | Status: DC
  Administered 2012-07-23: 3 mL via INTRAVENOUS

## 2012-07-23 MED ORDER — ATENOLOL 50 MG PO TABS
50.0000 mg | ORAL_TABLET | Freq: Every day | ORAL | Status: DC
  Administered 2012-07-24 – 2012-07-26 (×3): 50 mg via ORAL
  Filled 2012-07-23 (×4): qty 1

## 2012-07-23 MED ORDER — LOPERAMIDE HCL 2 MG PO CAPS
4.0000 mg | ORAL_CAPSULE | Freq: Once | ORAL | Status: DC
  Filled 2012-07-23: qty 2

## 2012-07-23 MED ORDER — LOPERAMIDE HCL 2 MG PO CAPS: 2.0000 mg | ORAL_CAPSULE | Freq: Three times a day (TID) | ORAL | Status: DC | PRN

## 2012-07-23 MED ORDER — ONDANSETRON HCL 4 MG PO TABS: 4.0000 mg | ORAL_TABLET | Freq: Four times a day (QID) | ORAL | Status: DC | PRN

## 2012-07-23 MED ORDER — METOPROLOL TARTRATE 25 MG PO TABS
25.0000 mg | ORAL_TABLET | Freq: Three times a day (TID) | ORAL | Status: DC
  Filled 2012-07-23: qty 1

## 2012-07-23 MED ORDER — ONDANSETRON HCL 4 MG/2ML IJ SOLN: 4.0000 mg | Freq: Four times a day (QID) | INTRAMUSCULAR | Status: DC | PRN

## 2012-07-23 MED ORDER — HYDRALAZINE HCL 20 MG/ML IJ SOLN
10.0000 mg | Freq: Four times a day (QID) | INTRAMUSCULAR | Status: DC | PRN
  Administered 2012-07-23: 10 mg via INTRAVENOUS

## 2012-07-23 MED ORDER — HYDROMORPHONE HCL PF 1 MG/ML IJ SOLN
0.5000 mg | INTRAMUSCULAR | Status: DC | PRN
  Administered 2012-07-23 – 2012-07-24 (×2): 0.5 mg via INTRAVENOUS
  Filled 2012-07-23 (×2): qty 1

## 2012-07-23 MED ORDER — OXYCODONE HCL 5 MG PO TABS
5.0000 mg | ORAL_TABLET | ORAL | Status: DC | PRN
  Administered 2012-07-23 – 2012-07-26 (×12): 5 mg via ORAL
  Filled 2012-07-23 (×13): qty 1

## 2012-07-23 MED ORDER — CLOPIDOGREL BISULFATE 75 MG PO TABS
75.0000 mg | ORAL_TABLET | Freq: Every day | ORAL | Status: DC
  Administered 2012-07-24 – 2012-07-26 (×3): 75 mg via ORAL
  Filled 2012-07-23 (×6): qty 1

## 2012-07-23 NOTE — ED Notes (Signed)
Bed:WA10<BR> Expected date:<BR> Expected time:<BR> Means of arrival:<BR> Comments:<BR> EMS

## 2012-07-23 NOTE — ED Provider Notes (Signed)
  Physical Exam  BP 143/85  Pulse 75  Temp 98 F (36.7 C) (Oral)  Resp 22  SpO2 97%  Physical Exam  ED Course  Procedures  MDM Ct angio does not show PE. Continued hypoxia on Room air. Will admit.      Juliet Rude. Rubin Payor, MD 07/23/12 1003

## 2012-07-23 NOTE — ED Notes (Signed)
Per EMS. Patient from home, c/o R lower back pain. Patient denies any traumatic injury. Patient bent down to pick up something, used the couch to try pulling herself, and felt the muscles in her back pull. This occurred at @1030  on  07/22/12. EMS was called to the home at 1330, and again now, patient states the pain is not getting any better. Patient states she has iced it most of the night. Family member reports intermittent diarrhea x3 days.

## 2012-07-23 NOTE — ED Provider Notes (Signed)
History     CSN: [id number]  Arrival date & time 07/23/12  0449   First MD Initiated Contact with Patient 07/23/12 [phone]      Chief Complaint  Patient presents with  . Back Pain    (Consider location/radiation/quality/duration/timing/severity/associated sxs/prior treatment) HPI 78 year old female presents to emergency department with complaint of right back pain. Patient reports she developed back pain throughout the day yesterday. She has been icing without improvement. Patient reports she had gotten down the floor to retrieve something and used a chair to pull herself up with. She reports pain did not start right away, but she noticed it sometime later and related to getting up using the chair. Pain is worse with movement. She denies hearing any pop. Patient was seen by EMS during the day, and when pain worsened she contacted them again. Patient noted to be hypoxic with oxygen levels at 88 on room air. She denies any lung disease. She denies any shortness of breath, no cough. She points to the pain at her right posterior lower rib area. She denies any leg swelling, but reports she does occasionally have pains in her legs.  Past Medical History  Diagnosis Date  . ARTHRITIS   . 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 CORONARY ARTERY DISEASE   . MACULAR DEGENERATION   . PERIPHERAL VASCULAR DISEASE   . Sebaceous cyst     L chin and R neck  . Subclavian steal syndrome   . HYPERTENSION   . HYPERLIPIDEMIA   . Cyst of breast 2007    cyst on breast    Past Surgical History  Procedure Date  . Tonsillectomy   . Sabacious cyst removed from breast 2011  . Breast surgery 2011    sebacious cyst removal     Family History  Problem Relation Age of Onset  . Heart disease Other     Parent  . Miscarriages / Stillbirths Other   . Heart attack Father   . Cancer Mother   . Hyperlipidemia Brother   . Hypertension Brother     History  Substance Use Topics  .  Smoking status: Former Smoker    Quit date: 09/29/2009  . Smokeless tobacco: Never Used     Comment: Widowed, lives in [facility] living. Moved to [place] from [place] fall 2011 to be near dtr  . Alcohol Use: Yes     Comment: Occasional    OB History    Grav Para Term Preterm Abortions TAB SAB Ect Mult Living                  Review of Systems  See History of Present Illness; otherwise all other systems are reviewed and negative  Allergies  Penicillins and Sulfonamide derivatives  Home Medications   Current Outpatient Rx  Name  Route  Sig  Dispense  Refill  . ACETAMINOPHEN 500 MG PO TABS   Oral   Take 1,000 mg by mouth every 6 (six) hours as needed.         . ATENOLOL 50 MG PO TABS      TAKE 1 BY MOUTH ONCE DAILY   90 tablet   1   . VITAMIN D 1000 UNITS PO TABS   Oral   Take 2,000 Units by mouth 2 (two) times daily.          Marland Kitchen CLOPIDOGREL BISULFATE 75 MG PO TABS      TAKE 1 TABLET BY MOUTH EVERY  DAY   90 tablet   1   . PRAVASTATIN SODIUM 20 MG PO TABS      TAKE 1 TABLET BY MOUTH EVERY DAY   90 tablet   1     BP 147/63  Pulse 70  Temp 98 F (36.7 C) (Oral)  Resp 16  SpO2 92%  Physical Exam  Nursing note and vitals reviewed. Constitutional: She is oriented to person, place, and time. She appears well-developed and well-nourished.       Patient in mild distress due to pain, appears stated age. Awake alert  HENT:  Head: Normocephalic and atraumatic.  Right Ear: External ear normal.  Left Ear: External ear normal.  Nose: Nose normal.  Mouth/Throat: Oropharynx is clear and moist.  Eyes: Conjunctivae normal and EOM are normal. Pupils are equal, round, and reactive to light.  Neck: Normal range of motion. Neck supple. No JVD present. No tracheal deviation present. No thyromegaly present.  Cardiovascular: Normal rate, regular rhythm, normal heart sounds and intact distal pulses.  Exam reveals no gallop and no friction rub.   No murmur heard. Pulmonary/Chest:  Effort normal and breath sounds normal. No stridor. No respiratory distress. She has no wheezes. She has no rales. She exhibits tenderness (she has mild tenderness to right lateral thoracic area at around T.7-9. There is no step-off crepitus or spasm noted. Palpation does not reproduce pain).       Patient with oxygen levels of 88 with good waveform. Pulse ox was moved to a different finger, and remained at 86-89 with good waveform.  Abdominal: Soft. Bowel sounds are normal. She exhibits no distension and no mass. There is no tenderness. There is no rebound and no guarding.  Musculoskeletal: Normal range of motion. She exhibits no edema and no tenderness.  Lymphadenopathy:    She has no cervical adenopathy.  Neurological: She is alert and oriented to person, place, and time. She exhibits normal muscle tone. Coordination normal.  Skin: Skin is warm and dry. No rash noted. No erythema. No pallor.  Psychiatric: She has a normal mood and affect. Her behavior is normal. Judgment and thought content normal.    ED Course  Procedures (including critical care time)  Labs Reviewed  D-DIMER, QUANTITATIVE - Abnormal; Notable for the following:    D-Dimer, Quant 4.36 (*)     All other components within normal limits  CBC WITH DIFFERENTIAL - Abnormal; Notable for the following:    WBC 12.8 (*)     Hemoglobin 11.5 (*)     HCT 35.5 (*)     RDW 16.0 (*)     Neutrophils Relative 86 (*)     Neutro Abs 11.1 (*)     Lymphocytes Relative 7 (*)     All other components within normal limits  BASIC METABOLIC PANEL   Dg Chest 2 View  07/23/2012  *RADIOLOGY REPORT*  Clinical Data: Right-sided chest and upper back pain for 1 week. Hypoxia.  CHEST - 2 VIEW  Comparison: Chest radiograph performed 06/28/2010  Findings: The lungs are well-aerated.  Minimal left basilar atelectasis or scarring is noted.  There is no evidence of pleural effusion or pneumothorax.  The heart is normal in size; calcification is noted in  the aortic arch.  No acute osseous abnormalities are seen.  A likely chronic compression deformity is noted at the lower thoracic spine.  IMPRESSION:  1.  Minimal left basilar atelectasis or scarring noted; lungs otherwise clear. 2.  Likely chronic compression deformity noted  at the lower thoracic spine.   Original Report Authenticated By: Tonia Ghent, M.D.    Dg Thoracic Spine 2 View  07/23/2012  *RADIOLOGY REPORT*  Clinical Data: Right-sided chest and upper back pain for 1 week.  THORACIC SPINE - 2 VIEW  Comparison: Chest radiograph performed 06/28/2010  Findings: There is a compression deformity involving vertebral body T10, of indeterminate age.  Would correlate for associated symptoms.  There is no additional evidence for fracture.  Vertebral bodies demonstrate normal alignment.  Intervertebral disc spaces are preserved.  Sclerotic degenerative change is noted along the upper lumbar spine.  The visualized portions of both lungs are clear.  The mediastinum is unremarkable in appearance.  Scattered vascular calcifications are seen.  IMPRESSION: Compression deformity involving vertebral body T10, of indeterminate age.  Would correlate for associated symptoms.  No additional evidence to suggest fracture.  Mild degenerative change noted along the upper lumbar spine.   Original Report Authenticated By: Tonia Ghent, M.D.      No diagnosis found.    MDM  78 year old female with right mid back pain and hypoxia. I am concerned about PE given the location of her pain. She does not complain of dyspnea or pleuritic type pain, but I cannot reproduce her pain with palpation of her back. Pain did not come on immediately after pulling herself up from a kneeling position. We'll start with d-dimer and basic labs, will get chest x-ray and thoracic films.     7:09 AM Patient with significantly elevated d-dimer. Will place IV, get CT angio chest.  Care past to oncoming team  Olivia Mackie, MD 07/23/12 [phone]

## 2012-07-23 NOTE — Progress Notes (Signed)
Pt had two episodes of SVT in the 150-160s within this past hour. She was resting, asymptomatic and her BP is currently 141/65. Paging MD at this time Belinda Fisher

## 2012-07-23 NOTE — Progress Notes (Signed)
Called by Rn that patient was experiencing episodes of transient SVT. She denied any chest pain. But was concerned about her back pain.  EKG was done which revealed St depression in V3-6. Trop was 0.9.  Not entirely clear what is causing her EKG changes and elevated troponin. Could be an atypical presentation of CAD/NSTEMI.  Have discussed with [facility] cardiology and they will evaluate tonight. Start Aspirin, Metoprolol. Hold off on Heparin for now per cards.  Discussed with patient and her daughter.  Continue to monitor closely.  [name] 7:07 PM

## 2012-07-23 NOTE — ED Notes (Addendum)
Patient c/o right sided lower back pain. Patient denies falling, patient states she kneeled down to pick something up off the floor and was having difficulty with standing, she used something near by as leverage to stand herself at @ 1000 07/22/12. Patient denies hearing anything pop or feel anything pull. Patient states she took Tylenol at 2145 07/22/12, Patient family at bedside states she also put ice on it. Patient lives independently at [facility]. Patient states she is unable to take NSAIDS due to being on blood thinners. Patient states she is here because she needs pain medicine.

## 2012-07-23 NOTE — Care Management Note (Unsigned)
    Page 1 of 1   07/24/2012     11:20:00 AM   CARE MANAGEMENT NOTE 07/24/2012  Patient:  LAPORCHA, MARCHESI   Account Number:  [id number]  Date Initiated:  07/23/2012  Documentation initiated by:  Lanier Clam  Subjective/Objective Assessment:   ADMITTED W/SOB,HYPOXIA.?PE.HX:CAD     Action/Plan:   FROM [place] [facility].   Anticipated DC Date:  07/24/2012   Anticipated DC Plan:  HOME/SELF CARE      DC Planning Services  CM consult      Choice offered to / List presented to:  C-1 Patient           Status of service:  In process, will continue to follow Medicare Important Message given?   (If response is "NO", the following Medicare IM given date fields will be blank) Date Medicare IM given:   Date Additional Medicare IM given:    Discharge Disposition:    Per UR Regulation:  Reviewed for med. necessity/level of care/duration of stay  If discussed at Long Length of Stay Meetings, dates discussed:    Comments:  07/24/12 Jame Seelig RN,BSN NCM [phone] PT-24HR SUPV. PROVIDED PRIVATE SITTER LIST AS RESOURCE TO SUPPLEMENT 24HR SUPV IF PATIENT CHOOSES.  07/23/12 Jocelyn Lowery RN,BSN NCM [phone] AWAIT PT/OT CONS.

## 2012-07-23 NOTE — Progress Notes (Signed)
Pt has refused several interventions including medication, labwork, and nursing procedures. She is anxious about trying multiple types of analgesics, and getting her inhalers confused because she can't see the labels well. Attempted to explain that her medications are being verified by several multi-positional healthcare team members. She states that she is "worried" and "confused". At this time pt is refusing any pain medicine at this time and pt was reposition to the best of staff ability [place] [employer]

## 2012-07-23 NOTE — ED Notes (Signed)
Patient requested assistance to bathroom. I attempted to assist patient to a sitting/standing position and was unable to do so. Patient was pulling forcefully, causing me to be off balance. I informed patient we would not be getting her up to to go to the restroom and instead was going to place her on the bedpan. Patient insisted that she would not get on bed pan and told her daughter to help her up. I informed patient daughter that the patient was too weak to get up to walk, do not attempt in assisting her to a standing position. Patient refused to use bedpan.

## 2012-07-23 NOTE — Consult Note (Signed)
CARDIOLOGY CONSULT NOTE   Patient ID: [name] MRN: [id number] DOB/AGE: 01-03-1922 78 y.o.  Admit date: 07/23/2012  Primary Physician   Rene Paci, MD Primary Cardiologist  New Reason for Consultation   Abnl ECG and elevated enzymes  NWG:NFAO Pry is a 78 y.o. female with no history of CAD.  She developed lower back pain and RLE pain yesterday. She came to the hospital and was admitted for hypoxia and back pain. Her ECG was abnormal in a different way from 2011, so troponin was checked and was elevated. Cardiology was asked to evaluate her.  She complains only of back pain. She never gets chest pain. Her activity is limited by bilateral LE pain that is a cramping/aching pain in both legs and resolves with rest. She does not climb steps. Her major activity is walking to the dining room for meals. She has chronic DOE that has not changed much recently. She has not history of LE edema, or PND. She denies orthopnea. Currently, on oxygen, she is resting comfortably.   Past Medical History  Diagnosis Date  . ARTHRITIS   . 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 CORONARY ARTERY DISEASE   . MACULAR DEGENERATION   . PERIPHERAL VASCULAR DISEASE   . Sebaceous cyst     L chin and R neck  . Subclavian steal syndrome   . HYPERTENSION   . HYPERLIPIDEMIA   . Cyst of breast 2007    cyst on breast     Past Surgical History  Procedure Date  . Tonsillectomy   . Sabacious cyst removed from breast 2011  . Breast surgery 2011    sebacious cyst removal     Allergies  Allergen Reactions  . Codeine   . Penicillins   . Sulfonamide Derivatives     I have reviewed the patient's current medications    . aspirin  325 mg Oral Daily  . clopidogrel  75 mg Oral Q breakfast  . enoxaparin (LOVENOX) injection  40 mg Subcutaneous Q24H  . fluticasone  2 puff Inhalation BID  . Ipratropium-Albuterol  1 puff Inhalation Q6H  . metoprolol tartrate  25 mg Oral TID       . sodium chloride 75 mL/hr at 07/23/12 1551   acetaminophen, acetaminophen, hydrALAZINE, HYDROmorphone (DILAUDID) injection, loperamide, ondansetron (ZOFRAN) IV, ondansetron, oxyCODONE  Prior to Admission medications   Medication Sig Start Date End Date Taking? Authorizing Provider  acetaminophen (TYLENOL) 500 MG tablet Take 1,000 mg by mouth every 6 (six) hours as needed.   Yes Historical Provider, MD  atenolol (TENORMIN) 50 MG tablet TAKE 1 BY MOUTH ONCE DAILY 05/23/12   Newt Lukes, MD  cholecalciferol (VITAMIN D) 1000 UNITS tablet Take 2,000 Units by mouth 2 (two) times daily.     Historical Provider, MD  clopidogrel (PLAVIX) 75 MG tablet TAKE 1 TABLET BY MOUTH EVERY DAY 03/08/12   Newt Lukes, MD  pravastatin (PRAVACHOL) 20 MG tablet TAKE 1 TABLET BY MOUTH EVERY DAY 06/14/12   Newt Lukes, MD     History   Social History  . Marital Status: Widowed    Spouse Name: N/A    Number of Children: N/A  . Years of Education: N/A   Occupational History  . 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 Smoking status: Former Smoker -- 1.0 packs/day for 61 years    Quit date: 09/29/2009  . Smokeless tobacco: Never Used  Comment: Widowed, lives in [facility] living. Moved to [place] from [place] fall 2011 to be near dtr  . Alcohol Use: Yes     Comment: Occasional  . Drug Use: No  . Sexually Active: No   Other Topics Concern  . Not on file   Social History Narrative   Lives in Independent Living Facility. Moved from [place] in the fall of 2011 to be near daughter     Family History  Problem Relation Age of Onset  . Heart disease Other     Parent  . Miscarriages / Stillbirths Other   . Heart attack Father   . Cancer Mother   . Hyperlipidemia Brother   . Hypertension Brother   . Stroke Brother      ROS:  Full 14 point review of systems complete and found to be negative unless listed above.  Physical Exam: Blood pressure 156/80, pulse 77,  temperature 98.2 F (36.8 C), temperature source Oral, resp. rate 20, height 5\' 2"  (1.575 m), weight 130 lb (58.968 kg), SpO2 94.00%.  General: Well developed, well nourished, female in no acute distress Head: Eyes PERRLA, No xanthomas.   Normocephalic and atraumatic, oropharynx without edema or exudate. Dentition: poor Lungs: bilateral basilar rales, right > left, no wheeze  Heart: HRRR S1 S2, no rub/gallop, no significant murmur. pulses are 2+ upper extrem, slightly decreased bilateral lower extrem with bilateral femoral bruits noted.   Neck: Right carotid bruit noted (not new). No lymphadenopathy.  JVD not elevated. Abdomen: Bowel sounds present, abdomen soft and non-tender without masses or hernias noted. Msk:  No cva tenderness. No weakness, no joint deformities or effusions. She has lumbar back pain and some tenderness in this area, worse with movement Extremities: No clubbing or cyanosis. No edema.  Neuro: Alert and oriented X 3. No focal deficits noted. Psych:  Good affect, responds appropriately Skin: No rashes or lesions noted. Appearance on both LE is consistent with arterial insufficiency  Labs:   Lab Results  Component Value Date   WBC 12.8* 07/23/2012   HGB 11.5* 07/23/2012   HCT 35.5* 07/23/2012   MCV 81.6 07/23/2012   PLT 345 07/23/2012   No results found for this basename: INR in the last 72 hours   Lab 07/23/12 0610 07/17/12 1133  NA 139 --  K 3.7 --  CL 105 --  CO2 22 --  BUN 32* --  CREATININE 0.80 --  CALCIUM 9.1 --  PROT -- 7.5  BILITOT -- 0.6  ALKPHOS -- 82  ALT -- 13  AST -- 16  GLUCOSE 109* --   Magnesium  Date Value Range Status  07/23/2012 1.9  1.5 - 2.5 mg/dL Final    Basename 16/10/96 1708  CKTOTAL --  CKMB --  TROPONINI 0.94*    Lab Results  Component Value Date   CHOL 137 07/17/2012   HDL 49.50 07/17/2012   LDLCALC 66 07/17/2012   TRIG 106.0 07/17/2012   Lab Results  Component Value Date   DDIMER 4.36* 07/23/2012   TSH    Date/Time Value Range Status  07/17/2012 11:33 AM 2.18  0.35 - 5.50 uIU/mL Final    Echo: 2011 Study Conclusions - Left ventricle: The cavity size was at the lower limits of normal. Korde Jeppsen thickness was at the upper limits of normal. Systolic function was normal. The estimated ejection fraction was in the range of 55% to 60%. Parissa Chiao motion was normal; there were no regional Roddrick Sharron motion abnormalities. Left ventricular diastolic function parameters were  normal. - Aortic valve: Mild to moderate regurgitation. - Mitral valve: Mild regurgitation.  ECG: 23-Jul-2012 18:04:13 [facility] 4E ROUTINE RECORD Normal sinus rhythm ST depression, consider subendocardial injury Abnormal ECG 6mm/s 75mm/mV 100Hz  8.0.1 12SL 241 HD CID: 1 Referred by: Unconfirmed Vent. rate 85 BPM PR interval 160 ms QRS duration 76 ms QT/QTc 388/461 ms P-R-T axes 62 -5 23   Radiology:  Dg Chest 2 View 07/23/2012  *RADIOLOGY REPORT*  Clinical Data: Right-sided chest and upper back pain for 1 week. Hypoxia.  CHEST - 2 VIEW  Comparison: Chest radiograph performed 06/28/2010  Findings: The lungs are well-aerated.  Minimal left basilar atelectasis or scarring is noted.  There is no evidence of pleural effusion or pneumothorax.  The heart is normal in size; calcification is noted in the aortic arch.  No acute osseous abnormalities are seen.  A likely chronic compression deformity is noted at the lower thoracic spine.  IMPRESSION:  1.  Minimal left basilar atelectasis or scarring noted; lungs otherwise clear. 2.  Likely chronic compression deformity noted at the lower thoracic spine.   Original Report Authenticated By: Tonia Ghent, M.D.    Dg Thoracic Spine 2 View 07/23/2012  *RADIOLOGY REPORT*  Clinical Data: Right-sided chest and upper back pain for 1 week.  THORACIC SPINE - 2 VIEW  Comparison: Chest radiograph performed 06/28/2010  Findings: There is a compression deformity involving vertebral body T10, of  indeterminate age.  Would correlate for associated symptoms.  There is no additional evidence for fracture.  Vertebral bodies demonstrate normal alignment.  Intervertebral disc spaces are preserved.  Sclerotic degenerative change is noted along the upper lumbar spine.  The visualized portions of both lungs are clear.  The mediastinum is unremarkable in appearance.  Scattered vascular calcifications are seen.  IMPRESSION: Compression deformity involving vertebral body T10, of indeterminate age.  Would correlate for associated symptoms.  No additional evidence to suggest fracture.  Mild degenerative change noted along the upper lumbar spine.   Original Report Authenticated By: Tonia Ghent, M.D.    Ct Angio Chest W/cm &/or Wo Cm 07/23/2012  *RADIOLOGY REPORT*  Clinical Data: Hypoxia.  Elevated D-dimer.  Right posterior chest pain.  CT ANGIOGRAPHY CHEST  Technique:  Multidetector CT imaging of the chest using the standard protocol during bolus administration of intravenous contrast. Multiplanar reconstructed images including MIPs were obtained and reviewed to evaluate the vascular anatomy.  Contrast: OMNIPAQUE IOHEXOL 350 MG/ML SOLN  Comparison: Chest radiograph 07/23/2012.  Findings: In the medial left breast, there is a 2 cm subcutaneous lesion just to the left of the midline, with a small ulceration/gas centrally.  This probably represents a sebaceous cyst however clinical correlation is recommended. More inferiorly, also to the left of midline is another 13 mm subcutaneous lesion with a similar appearance.  Technically adequate study without pulmonary embolus. Aortic atherosclerosis with extensive calcified and mural plaque in the aorta and branch vessels.  No aneurysm or acute aortic abnormality identified.  Coronary artery atherosclerosis is present. If office based assessment of coronary risk factors has not been performed, it is now recommended.  No pericardial effusion.  No pleural effusion.   Tortuous and ectatic brachiocephalic artery.  Lungs demonstrate dependent atelectasis.  Underlying emphysema is present.  There is no airspace disease in the lungs.  No aggressive osseous lesions are identified.  T10 compression fractures present with 50% loss of vertebral body height and mild retropulsion.  This is probably chronic, based on sclerosis of the vertebral  body.  There is no paravertebral phlegmon.  Incidental imaging the upper abdomen demonstrates a 2 cm right adrenal adenoma.  Abdominal aortic atherosclerosis. The areas of sclerosis are present in the anterior left ribs compatible with old rib fractures.  IMPRESSION: 1.  Technically adequate study.  No pulmonary embolus. 2.  Atherosclerosis and coronary artery disease.  No acute vascular abnormality. 3.  Age indeterminate but likely chronic T10 compression fracture. No paravertebral phlegmon and sclerosis suggest chronicity. 4.  2 cm right adrenal adenoma.   Original Report Authenticated By: Andreas Newport, M.D.     ASSESSMENT AND PLAN:   The patient was seen today by Dr Daleen Squibb, the patient evaluated and the data reviewed.   Elevated Troponin - Her ECG is also different from an ECG in 2011. Dr Daleen Squibb discussed the situation with the patient and her daughter. She has diffuse vascular disease evidenced by right carotid disease seen on exam and dopplers plus symptoms and femoral bruits consistent with peripheral vascular disease. The patient and her daughter concur that she does not want any further evaluation of these issues. She is already a DNR and has not had any chest pain. She is already on a beta blocker, a statin and Plavix. We have no reason to change any of these meds and initially thought adding aspirin would be beneficial but with the increased bleeding risk, the patient wishes to avoid med changes. No further testing will be pursued.    Otherwise, per primary MD. Principal Problem:  *Hypoxia Active Problems:  HYPERTENSION  Occlusion  and stenosis of carotid artery without mention of cerebral infarction  Back pain  Dehydration   Signed: Theodore Demark 07/23/2012, 8:07 PM Co-Sign MD I have independently taken a history and examined Mrs Braatz. Her clinical finding are consistent with diffuse atherosclerosis including most likely 3 vessel CAD. I discussed with her and her daughter at length. She desires no further evaluation, and she wishes to be a DNR. She only wants relief from her back pain. Continue Plavix, Atenolol and Pravastatin. No need for Cardiology follow up.Would try to get her back to where she lives for 1 last Christmas.

## 2012-07-23 NOTE — ED Notes (Signed)
Patient placed on 2L O2 [name].  

## 2012-07-23 NOTE — Clinical Social Work Psychosocial (Signed)
     Clinical Social Work Department BRIEF PSYCHOSOCIAL ASSESSMENT 07/23/2012  Patient:  [name], [name]     Account Number:  [id number]     Admit date:  07/23/2012  Clinical Social Worker:  Jacelyn Grip  Date/Time:  07/23/2012 03:30 PM  Referred by:  Physician  Date Referred:  07/23/2012 Referred for  Other - See comment   Other Referral:   Admitted from [facility]   Interview type:  Patient Other interview type:    PSYCHOSOCIAL DATA Living Status:  ALONE Admitted from facility:   Level of care:  Independent Living Primary support name:  Lesniak,Diane/daughter Primary support relationship to patient:  CHILD, ADULT Degree of support available:   unknown at this time/no family present at bedside    CURRENT CONCERNS Current Concerns  Other - See comment   Other Concerns:   Admitted from [facility]    SOCIAL WORK ASSESSMENT / PLAN CSW met with pt at bedside re: disposition planning.    Pt confirmed that she is a resident of [facility] and hopeful to return. Per RNCM who spoke with MD, pt will likely have home O2 needs with RNCM will assist with. Per chart, PT/OT evaluations pending.    CSW to check PT/OT evaluation to ensure pt does not need higher level of care. CSW to assist as appropriate.   Assessment/plan status:  Psychosocial Support/Ongoing Assessment of Needs Other assessment/ plan:   discharge planning   Information/referral to community resources:   None at this time, pt admitted from [facility] and CSW will follow to assist as appropriate.    PATIENTS/FAMILYS RESPONSE TO PLAN OF CARE: Pt alert and oriented x 4. Pt hopeful to return to [facility] and per MD, will likely be case that pt will be able to return to ILF level of care. Will check PT/OT evals, pt aware.

## 2012-07-23 NOTE — H&P (Signed)
[facility] History and Physical  Darl Brisbin WUJ:[id number] DOB: [birth year] DOA: 07/23/2012   PCP: Rene Paci, MD  Specialists: Vascular surgeon who monitor her carotids  Chief Complaint: Back pain  HPI: [name] is a 78 y.o. female with a past medical history of hypertension, TIAs who was in her usual state of health till about 2 days ago, when she started noticing pain in the mid back area and towards the flank. Patient says that couple days ago she was picking something off the floor and she held on to something to pull herself up. The back pain started not immediately afterwards, but definitely after that incident. Denies any falls or injuries. There is no radiation of the pain apart from to the right flank. Denies any urinary difficulties. No blood in the urine. Does not have any history of kidney stones. Denies any leg weakness. While she was being evaluated for the back pain in the emergency department she was found to be hypoxic. Her sats were about 87-88% on room air. She denies any chest pain or shortness of breath. No recent illness. She also admitted to a few loose stools in the last couple days. She does admit to smoking in the past.  Home Medications: Prior to Admission medications   Medication Sig Start Date End Date Taking? Authorizing Provider  acetaminophen (TYLENOL) 500 MG tablet Take 1,000 mg by mouth every 6 (six) hours as needed.   Yes Historical Provider, MD  atenolol (TENORMIN) 50 MG tablet TAKE 1 BY MOUTH ONCE DAILY 05/23/12   Newt Lukes, MD  cholecalciferol (VITAMIN D) 1000 UNITS tablet Take 2,000 Units by mouth 2 (two) times daily.     Historical Provider, MD  clopidogrel (PLAVIX) 75 MG tablet TAKE 1 TABLET BY MOUTH EVERY DAY 03/08/12   Newt Lukes, MD  pravastatin (PRAVACHOL) 20 MG tablet TAKE 1 TABLET BY MOUTH EVERY DAY 06/14/12   Newt Lukes, MD    Allergies:  Allergies  Allergen Reactions  . Codeine   . Penicillins   .  Sulfonamide Derivatives     Past Medical History: Past Medical History  Diagnosis Date  . ARTHRITIS   . 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 CORONARY ARTERY DISEASE   . MACULAR DEGENERATION   . PERIPHERAL VASCULAR DISEASE   . Sebaceous cyst     L chin and R neck  . Subclavian steal syndrome   . HYPERTENSION   . HYPERLIPIDEMIA   . Cyst of breast 2007    cyst on breast    Past Surgical History  Procedure Date  . Tonsillectomy   . Sabacious cyst removed from breast 2011  . Breast surgery 2011    sebacious cyst removal     Social History:  reports that she quit smoking about 2 years ago. She has never used smokeless tobacco. She reports that she drinks alcohol. She reports that she does not use illicit drugs. Quit smoking 1.5 years ago. Has a 50 pack year history of smoking.  Living Situation: Lives in a retirement home Activity Level: Usually independent with her daily activities   Family History:  Family History  Problem Relation Age of Onset  . Heart disease Other     Parent  . Miscarriages / Stillbirths Other   . Heart attack Father   . Cancer Mother   . Hyperlipidemia Brother   . Hypertension Brother      Review of Systems - History obtained  from the patient General ROS: negative Psychological ROS: negative Ophthalmic ROS: negative ENT ROS: negative Allergy and Immunology ROS: negative Hematological and Lymphatic ROS: negative Endocrine ROS: negative Respiratory ROS: no cough, shortness of breath, or wheezing Cardiovascular ROS: no chest pain or dyspnea on exertion Gastrointestinal ROS: no abdominal pain, change in bowel habits, or black or bloody stools Genito-Urinary ROS: no dysuria, trouble voiding, or hematuria Musculoskeletal ROS: positive for - back pain Neurological ROS: no TIA or stroke symptoms Dermatological ROS: negative  Physical Examination  Filed Vitals:   07/23/12 0530 07/23/12 0855 07/23/12 1135 07/23/12 1451   BP: 146/66 143/85 152/71   Pulse: 71 75 75 77  Temp:  98 F (36.7 C) 97.8 F (36.6 C) 98.2 F (36.8 C)  TempSrc:  Oral Oral Oral  Resp:  22 20 20   Height:   5\' 2"  (1.575 m)   Weight:   58.968 kg (130 lb)   SpO2: 90% 97% 100% 94%    General appearance: alert, cooperative, appears stated age and no distress Head: Normocephalic, without obvious abnormality, atraumatic Eyes: conjunctivae/corneas clear. PERRL, EOM's intact. Throat: lips, mucosa, and tongue normal; teeth and gums normal Neck: no adenopathy, no carotid bruit, no JVD, supple, symmetrical, trachea midline and thyroid not enlarged, symmetric, no tenderness/mass/nodules Back: mild tenderness over right flank area posteriorly. No spinal tenderness. Resp: decreased air entry at the bases with few crackles. No wheezing. Cardio: regular rate and rhythm, S1, S2 normal, no murmur, click, rub or gallop GI: soft, non-tender; bowel sounds normal; no masses,  no organomegaly Extremities: extremities normal, atraumatic, no cyanosis or edema Pulses: 2+ and symmetric Skin: Skin color, texture, turgor normal. No rashes or lesions Lymph nodes: Cervical, supraclavicular, and axillary nodes normal. Neurologic: Alert and oriented x 3. No focal deficits. SLR was normal. Reflexes equal and normal.  Laboratory Data: Results for orders placed during the hospital encounter of 07/23/12 (from the past 48 hour(s))  D-DIMER, QUANTITATIVE     Status: Abnormal   Collection Time   07/23/12  6:10 AM      Component Value Range Comment   D-Dimer, Quant 4.36 (*) 0.00 - 0.48 ug/mL-FEU   CBC WITH DIFFERENTIAL     Status: Abnormal   Collection Time   07/23/12  6:10 AM      Component Value Range Comment   WBC 12.8 (*) 4.0 - 10.5 K/uL    RBC 4.35  3.87 - 5.11 MIL/uL    Hemoglobin 11.5 (*) 12.0 - 15.0 g/dL    HCT 40.9 (*) 81.1 - 46.0 %    MCV 81.6  78.0 - 100.0 fL    MCH 26.4  26.0 - 34.0 pg    MCHC 32.4  30.0 - 36.0 g/dL    RDW 91.4 (*) 78.2 - 15.5 %     Platelets 345  150 - 400 K/uL    Neutrophils Relative 86 (*) 43 - 77 %    Neutro Abs 11.1 (*) 1.7 - 7.7 K/uL    Lymphocytes Relative 7 (*) 12 - 46 %    Lymphs Abs 0.9  0.7 - 4.0 K/uL    Monocytes Relative 6  3 - 12 %    Monocytes Absolute 0.8  0.1 - 1.0 K/uL    Eosinophils Relative 0  0 - 5 %    Eosinophils Absolute 0.0  0.0 - 0.7 K/uL    Basophils Relative 0  0 - 1 %    Basophils Absolute 0.0  0.0 - 0.1 K/uL  BASIC METABOLIC PANEL     Status: Abnormal   Collection Time   07/23/12  6:10 AM      Component Value Range Comment   Sodium 139  135 - 145 mEq/L    Potassium 3.7  3.5 - 5.1 mEq/L    Chloride 105  96 - 112 mEq/L    CO2 22  19 - 32 mEq/L    Glucose, Bld 109 (*) 70 - 99 mg/dL    BUN 32 (*) 6 - 23 mg/dL    Creatinine, Ser 1.61  0.50 - 1.10 mg/dL    Calcium 9.1  8.4 - 09.6 mg/dL    GFR calc non Af Amer 64 (*) >90 mL/min    GFR calc Af Amer 74 (*) >90 mL/min   URINALYSIS, ROUTINE W REFLEX MICROSCOPIC     Status: Abnormal   Collection Time   07/23/12 10:25 AM      Component Value Range Comment   Color, Urine YELLOW  YELLOW    APPearance CLEAR  CLEAR    Specific Gravity, Urine >1.046 (*) 1.005 - 1.030    pH 5.0  5.0 - 8.0    Glucose, UA NEGATIVE  NEGATIVE mg/dL    Hgb urine dipstick TRACE (*) NEGATIVE    Bilirubin Urine NEGATIVE  NEGATIVE    Ketones, ur TRACE (*) NEGATIVE mg/dL    Protein, ur NEGATIVE  NEGATIVE mg/dL    Urobilinogen, UA 0.2  0.0 - 1.0 mg/dL    Nitrite NEGATIVE  NEGATIVE    Leukocytes, UA TRACE (*) NEGATIVE   URINE MICROSCOPIC-ADD ON     Status: Normal   Collection Time   07/23/12 10:25 AM      Component Value Range Comment   Squamous Epithelial / LPF RARE  RARE    WBC, UA 0-2  <3 WBC/hpf    RBC / HPF 0-2  <3 RBC/hpf    Bacteria, UA RARE  RARE     Radiology Reports: Dg Chest 2 View  07/23/2012  *RADIOLOGY REPORT*  Clinical Data: Right-sided chest and upper back pain for 1 week. Hypoxia.  CHEST - 2 VIEW  Comparison: Chest radiograph performed  06/28/2010  Findings: The lungs are well-aerated.  Minimal left basilar atelectasis or scarring is noted.  There is no evidence of pleural effusion or pneumothorax.  The heart is normal in size; calcification is noted in the aortic arch.  No acute osseous abnormalities are seen.  A likely chronic compression deformity is noted at the lower thoracic spine.  IMPRESSION:  1.  Minimal left basilar atelectasis or scarring noted; lungs otherwise clear. 2.  Likely chronic compression deformity noted at the lower thoracic spine.   Original Report Authenticated By: Tonia Ghent, M.D.    Dg Thoracic Spine 2 View  07/23/2012  *RADIOLOGY REPORT*  Clinical Data: Right-sided chest and upper back pain for 1 week.  THORACIC SPINE - 2 VIEW  Comparison: Chest radiograph performed 06/28/2010  Findings: There is a compression deformity involving vertebral body T10, of indeterminate age.  Would correlate for associated symptoms.  There is no additional evidence for fracture.  Vertebral bodies demonstrate normal alignment.  Intervertebral disc spaces are preserved.  Sclerotic degenerative change is noted along the upper lumbar spine.  The visualized portions of both lungs are clear.  The mediastinum is unremarkable in appearance.  Scattered vascular calcifications are seen.  IMPRESSION: Compression deformity involving vertebral body T10, of indeterminate age.  Would correlate for associated symptoms.  No additional evidence to suggest  fracture.  Mild degenerative change noted along the upper lumbar spine.   Original Report Authenticated By: Tonia Ghent, M.D.    Ct Angio Chest W/cm &/or Wo Cm  07/23/2012  *RADIOLOGY REPORT*  Clinical Data: Hypoxia.  Elevated D-dimer.  Right posterior chest pain.  CT ANGIOGRAPHY CHEST  Technique:  Multidetector CT imaging of the chest using the standard protocol during bolus administration of intravenous contrast. Multiplanar reconstructed images including MIPs were obtained and reviewed to  evaluate the vascular anatomy.  Contrast: OMNIPAQUE IOHEXOL 350 MG/ML SOLN  Comparison: Chest radiograph 07/23/2012.  Findings: In the medial left breast, there is a 2 cm subcutaneous lesion just to the left of the midline, with a small ulceration/gas centrally.  This probably represents a sebaceous cyst however clinical correlation is recommended. More inferiorly, also to the left of midline is another 13 mm subcutaneous lesion with a similar appearance.  Technically adequate study without pulmonary embolus. Aortic atherosclerosis with extensive calcified and mural plaque in the aorta and branch vessels.  No aneurysm or acute aortic abnormality identified.  Coronary artery atherosclerosis is present. If office based assessment of coronary risk factors has not been performed, it is now recommended.  No pericardial effusion.  No pleural effusion.  Tortuous and ectatic brachiocephalic artery.  Lungs demonstrate dependent atelectasis.  Underlying emphysema is present.  There is no airspace disease in the lungs.  No aggressive osseous lesions are identified.  T10 compression fractures present with 50% loss of vertebral body height and mild retropulsion.  This is probably chronic, based on sclerosis of the vertebral body.  There is no paravertebral phlegmon.  Incidental imaging the upper abdomen demonstrates a 2 cm right adrenal adenoma.  Abdominal aortic atherosclerosis. The areas of sclerosis are present in the anterior left ribs compatible with old rib fractures.  IMPRESSION: 1.  Technically adequate study.  No pulmonary embolus. 2.  Atherosclerosis and coronary artery disease.  No acute vascular abnormality. 3.  Age indeterminate but likely chronic T10 compression fracture. No paravertebral phlegmon and sclerosis suggest chronicity. 4.  2 cm right adrenal adenoma.   Original Report Authenticated By: Andreas Newport, M.D.     Problem List  Principal Problem:  *Hypoxia Active Problems:  HYPERTENSION   Occlusion and stenosis of carotid artery without mention of cerebral infarction  Back pain   Assessment: This is 77 year old, Caucasian female, with medical history as stated earlier, who presents with back pain, and was found to be hypoxic. Her back pain is musculoskeletal in etiology. There is no radiculopathy. It appears, that she may have pulled a muscle. Hypoxia is probably from emphysema or poor effort.  Plan: #1 hypoxia: Will get ABG on room air. She does have significant history of smoking and almost a 50-pack-year history of smoking. She probably has COPD/emphysema. Changes of the same are noted on the CT scan. No PE was seen. We will give her inhaled steroids. Combivent will be prescribed. Incentive spirometry will be utilized. We will repeat her room air saturations in the morning and if low she will require home oxygen. She will require further workup for this as an outpatient in the form of pulmonary function test. I will defer this to her PCP.  #2 back pain: Probably as a result of injury that she sustained when she was pulling herself up 2 days ago. Examination does not suggest any radiculopathy. No lower extremity weakness. No other neurological weakness. Her UA does not show any significant hematuria. We will have physical therapy see  her. Pain control will be provided.  #3 mild dehydration: Give gentle IV hydration. Her urine specific gravity is high. It's quite possible this may be due to the multiple loose stools, that she's had in the last few days. Her abdomen is completely benign at this time.  Continue with home medications. Her blood pressure is stable.  Further management decisions will depend on results of further testing and patient's response to treatment.  Code Status: She is a DO NOT RESUSCITATE. This was discussed in detail with the patient Family Communication: No family at bedside  Disposition Plan: Will likely return home in the a.m.   [facility]  [facility] Pager [phone]  If 7PM-7AM, please contact night-coverage [email or web address] Password [facility]  07/23/2012, 3:19 PM

## 2012-07-24 LAB — BASIC METABOLIC PANEL
CO2: 23 mEq/L (ref 19–32)
GFR calc non Af Amer: 79 mL/min — ABNORMAL LOW (ref >90)
Glucose, Bld: 102 mg/dL — ABNORMAL HIGH (ref 70–99)
Potassium: 3.7 mEq/L (ref 3.5–5.1)
Sodium: 135 mEq/L (ref 135–145)

## 2012-07-24 LAB — CBC
Hemoglobin: 11.1 g/dL — ABNORMAL LOW (ref 12.0–15.0)
MCH: 26.1 pg (ref 26.0–34.0)
RBC: 4.25 MIL/uL (ref 3.87–5.11)
WBC: 9.4 10*3/uL (ref 4.0–10.5)

## 2012-07-24 MED ORDER — LIDOCAINE 5 % EX PTCH
1.0000 | MEDICATED_PATCH | CUTANEOUS | Status: DC
  Administered 2012-07-24 – 2012-07-26 (×3): 1 via TRANSDERMAL
  Filled 2012-07-24 (×3): qty 1

## 2012-07-24 MED ORDER — ENSURE COMPLETE PO LIQD
237.0000 mL | Freq: Two times a day (BID) | ORAL | Status: DC
  Administered 2012-07-24 – 2012-07-25 (×3): 237 mL via ORAL

## 2012-07-24 MED ORDER — IPRATROPIUM-ALBUTEROL 20-100 MCG/ACT IN AERS
1.0000 | INHALATION_SPRAY | Freq: Four times a day (QID) | RESPIRATORY_TRACT | Status: DC | PRN
  Filled 2012-07-24: qty 4

## 2012-07-24 MED ORDER — SIMVASTATIN 10 MG PO TABS
10.0000 mg | ORAL_TABLET | Freq: Every day | ORAL | Status: DC
  Administered 2012-07-24 – 2012-07-25 (×2): 10 mg via ORAL
  Filled 2012-07-24 (×3): qty 1

## 2012-07-24 MED ORDER — LORAZEPAM 1 MG PO TABS
1.0000 mg | ORAL_TABLET | Freq: Once | ORAL | Status: DC
  Filled 2012-07-24: qty 1

## 2012-07-24 NOTE — Progress Notes (Signed)
INITIAL NUTRITION ASSESSMENT  DOCUMENTATION CODES Per approved criteria  -Not Applicable   INTERVENTION: - Recommend nursing assist pt at mealtimes - Hopefully as back pain improves, pt will eat better - Ensure Complete BID for additional nutrition  - RD to monitor plan of care   NUTRITION DIAGNOSIS: Inadequate oral intake related to back pain and difficulties seeing food as evidenced by pt statement.   Goal: Pt to consume >90% of meals/supplements  Monitor:  Weights, labs, intake   Reason for Assessment: Nutrition risk   78 y.o. female  Admitting Dx: Hypoxia  ASSESSMENT: Pt reports 3 day history of diarrhea which has improved today. Pt reports she was not eating well because of macular degeneration making it hard for her to see her food and back pain causing her difficulty to bend over to eat. Pt reports she was eating better with the nurse tech helping her yesterday. Pt reports eating well PTA, 3 meals/day, no weight loss.   Height: Ht Readings from Last 1 Encounters:  07/23/12 5\' 2"  (1.575 m)    Weight: Wt Readings from Last 1 Encounters:  07/23/12 130 lb (58.968 kg)    Ideal Body Weight: 110 lb  % Ideal Body Weight: 118  Wt Readings from Last 10 Encounters:  07/23/12 130 lb (58.968 kg)  07/17/12 129 lb (58.514 kg)  07/12/12 128 lb (58.06 kg)  01/24/12 125 lb (56.7 kg)  01/18/12 112 lb (50.803 kg)  07/22/11 123 lb (55.792 kg)  01/11/11 122 lb 1.9 oz (55.393 kg)  10/12/10 117 lb (53.071 kg)  07/13/10 113 lb (51.256 kg)    Usual Body Weight: 130 lb  % Usual Body Weight: 100  BMI:  Body mass index is 23.78 kg/(m^2).  Estimated Nutritional Needs: Kcal: 1610-9604 Protein: 60-70g Fluid: > 1.7L  Diet Order: Cardiac  EDUCATION NEEDS: -No education needs identified at this time   Intake/Output Summary (Last 24 hours) at 07/24/12 1144 Last data filed at 07/24/12 0900  Gross per 24 hour  Intake 916.25 ml  Output    460 ml  Net 456.25 ml    Last  BM: 12/23, diarrhea per pt  Labs:   Lab 07/24/12 0509 07/23/12 1708 07/23/12 0610  NA 135 -- 139  K 3.7 -- 3.7  CL 102 -- 105  CO2 23 -- 22  BUN 16 -- 32*  CREATININE 0.61 -- 0.80  CALCIUM 8.8 -- 9.1  MG -- 1.9 --  PHOS -- -- --  GLUCOSE 102* -- 109*    CBG (last 3)  No results found for this basename: GLUCAP:3 in the last 72 hours  Scheduled Meds:   . atenolol  50 mg Oral Daily  . clopidogrel  75 mg Oral Q breakfast  . enoxaparin (LOVENOX) injection  40 mg Subcutaneous Q24H  . fluticasone  2 puff Inhalation BID  . Ipratropium-Albuterol  1 puff Inhalation Q6H  . lidocaine  1 patch Transdermal Q24H  . LORazepam  1 mg Oral Once  . simvastatin  10 mg Oral q1800    Continuous Infusions:   Past Medical History  Diagnosis Date  . ARTHRITIS   . 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 CORONARY ARTERY DISEASE   . MACULAR DEGENERATION   . PERIPHERAL VASCULAR DISEASE   . Sebaceous cyst     L chin and R neck  . Subclavian steal syndrome   . HYPERTENSION   . HYPERLIPIDEMIA   . Cyst of breast 2007  cyst on breast    Past Surgical History  Procedure Date  . Tonsillectomy   . Sabacious cyst removed from breast 2011  . Breast surgery 2011    sebacious cyst removal      Levon Hedger MS, RD, LDN [phone] Pager [phone] After Hours Pager

## 2012-07-24 NOTE — Evaluation (Addendum)
Occupational Therapy Evaluation Patient Details Name: [name] MRN: [id number] DOB: [birth year] Today's Date: 07/24/2012 Time: 7829-5621 OT Time Calculation (min): 27 min  OT Assessment / Plan / Recommendation Clinical Impression  Pt admitted for hypoxemia and back pain and found to have diffuse atherosclerosis.  Pt reports pain mostly R posterior flank area. Pt requiring increased assist with ADLs mainly due to pain. Skilled OT indicated to address the below mentioned deficits in prep for safe d/c home.    OT Assessment  Patient needs continued OT Services    Follow Up Recommendations  Home health OT    Barriers to Discharge Decreased caregiver support    Equipment Recommendations  None recommended by OT    Recommendations for Other Services    Frequency  Min 2X/week    Precautions / Restrictions Precautions Precautions: Fall   Pertinent Vitals/Pain Pt reported R posterior flank pain which she did not rate. Repositioned for comfort.    ADL  Grooming: Brushing hair;Set up Where Assessed - Grooming: Unsupported sitting Upper Body Bathing: Set up Where Assessed - Upper Body Bathing: Unsupported sitting Lower Body Bathing: Minimal assistance Where Assessed - Lower Body Bathing: Supported sit to stand Upper Body Dressing: Set up Where Assessed - Upper Body Dressing: Unsupported sitting Lower Body Dressing: Moderate assistance Where Assessed - Lower Body Dressing: Supported sit to stand Toilet Transfer: Hydrographic surveyor Method: Sit to Barista: Comfort height toilet Toileting - Architect and Hygiene: Min guard Where Assessed - Engineer, mining and Hygiene: Sit to stand from 3-in-1 or toilet Equipment Used: Rolling walker ADL Comments: Pt limited by back pain. Required physical A to thread both LEs into legs of underwear. Was able to pull up around hips without difficulty. Pt unable to bend over or cross legs at  this time.    OT Diagnosis: Generalized weakness  OT Problem List: Pain;Decreased activity tolerance;Decreased safety awareness;Decreased knowledge of use of DME or AE OT Treatment Interventions: Self-care/ADL training;Therapeutic activities;DME and/or AE instruction;Patient/family education   OT Goals Acute Rehab OT Goals OT Goal Formulation: With patient Time For Goal Achievement: 08/07/12 Potential to Achieve Goals: Good ADL Goals Pt Will Perform Grooming: with supervision;Standing at sink ADL Goal: Grooming - Progress: Goal set today Pt Will Transfer to Toilet: with supervision;Ambulation;with DME ADL Goal: Toilet Transfer - Progress: Goal set today Pt Will Perform Toileting - Clothing Manipulation: with supervision;Sitting on 3-in-1 or toilet;Standing ADL Goal: Toileting - Clothing Manipulation - Progress: Goal set today Pt Will Perform Toileting - Hygiene: with supervision;Sit to stand from 3-in-1/toilet ADL Goal: Toileting - Hygiene - Progress: Goal set today Additional ADL Goal #1: Pt will complete all aspects of bathing and dressing with supervision. ADL Goal: Additional Goal #1 - Progress: Goal set today  Visit Information  Last OT Received On: 07/24/12 Assistance Needed: +1 PT/OT Co-Evaluation/Treatment: Yes    Subjective Data  Subjective: I'd really like to stay in the bed today. Patient Stated Goal: For this pain in my back to go away.   Prior Functioning     Home Living Lives With: Alone Available Help at Discharge:  (May be staying with son???) Type of Home: Independent living facility Home Access: Level entry Home Layout: One level Bathroom Shower/Tub: Health visitor: Standard Home Adaptive Equipment: Grab bars in shower;Shower chair with back Prior Function Level of Independence: Independent Driving: No Vocation: Retired Musician: No difficulties         Vision/Perception  Cognition  Overall Cognitive  Status: Appears within functional limits for tasks assessed/performed Arousal/Alertness: Awake/alert Orientation Level: Appears intact for tasks assessed Behavior During Session: [facility] for tasks performed    Extremity/Trunk Assessment Right Upper Extremity Assessment RUE ROM/Strength/Tone: [facility] for tasks assessed Left Upper Extremity Assessment LUE ROM/Strength/Tone: WFL for tasks assessed Right Lower Extremity Assessment RLE ROM/Strength/Tone: [facility] for tasks assessed Left Lower Extremity Assessment LLE ROM/Strength/Tone: [facility] for tasks assessed Trunk Assessment Trunk Assessment: Other exceptions (decreaesd rotations, flexion due to R posterior flank pain)     Mobility Bed Mobility Bed Mobility: Supine to Sit;Sit to Supine Supine to Sit: 5: Supervision Sit to Supine: 5: Supervision Details for Bed Mobility Assistance: increased time, attempted to encourage log roll however pt performed her own way Transfers Sit to Stand: 4: Min guard;With upper extremity assist;From bed;From toilet;From chair/3-in-1 Stand to Sit: 4: Min guard;With upper extremity assist;To toilet;To chair/3-in-1;To bed Details for Transfer Assistance: occasional verbal cue for hand placement     Shoulder Instructions     Exercise     Balance Dynamic Standing Balance Dynamic Standing - Balance Support: During functional activity;No upper extremity supported Dynamic Standing - Level of Assistance: 5: Stand by assistance Dynamic Standing - Comments: Pt pulling up undergarments with both hands, posterior lean of LEs on bed observed for more support   End of Session OT - End of Session Activity Tolerance: Patient limited by pain Patient left: in bed;with call bell/phone within reach;with bed alarm set  GO     Ellawyn Wogan A OTR/L [phone] 07/24/2012, 11:55 AM

## 2012-07-24 NOTE — Progress Notes (Signed)
Patient has refused combivent since her initial admission. RT treatment protocol completed. Orders changed accordingly.

## 2012-07-24 NOTE — Evaluation (Signed)
Physical Therapy Evaluation Patient Details Name: [name] MRN: [id number] DOB: 06-17-1922 Today's Date: 07/24/2012 Time: 0939-1000 PT Time Calculation (min): 21 min  PT Assessment / Plan / Recommendation Clinical Impression  Pt admitted for hypoxemia and back pain and found to have diffuse atherosclerosis.  Pt reports pain mostly R posterior flank and pt believes likely due to muscle strain.  Pt would benefit from acute PT services in order to improve independence with transfers and ambulation and assist with decreasing back pain during mobility to prepare pt for d/c.  Pt appears close to baseline in regards to mobility so will  likely not need f/u but recommend 24/7 assist due to pain causing pt need for assist with ADLs    PT Assessment  Patient needs continued PT services    Follow Up Recommendations  Supervision/Assistance - 24 hour;No PT follow up    Does the patient have the potential to tolerate intense rehabilitation      Barriers to Discharge        Equipment Recommendations  Rolling walker with 5" wheels    Recommendations for Other Services     Frequency Min 3X/week    Precautions / Restrictions Precautions Precautions: Fall   Pertinent Vitals/Pain 5/10 R posterior flank pain, premedicated, repositioned in bed (pt declined chair due to discomfort)  SaO2 room air upon return to room from ambulation 92% and HR 88 bpm     Mobility  Bed Mobility Bed Mobility: Supine to Sit;Sit to Supine Supine to Sit: 5: Supervision Sit to Supine: 5: Supervision Details for Bed Mobility Assistance: increased time, attempted to encourage log roll however pt performed her own way Transfers Transfers: Sit to Stand;Stand to Sit Sit to Stand: 4: Min guard;With upper extremity assist;From bed;From toilet;From chair/3-in-1 Stand to Sit: 4: Min guard;With upper extremity assist;To toilet;To chair/3-in-1;To bed Details for Transfer Assistance: occasional verbal cue for hand  placement Ambulation/Gait Ambulation/Gait Assistance: 4: Min guard Ambulation Distance (Feet): 100 Feet Assistive device: Rolling walker Ambulation/Gait Assistance Details: verbal cues for safe use of RW (pt reports never using before and only used today for pain control) Gait Pattern: Step-through pattern;Decreased trunk rotation Gait velocity: decreased    Shoulder Instructions     Exercises     PT Diagnosis: Difficulty walking;Acute pain  PT Problem List: Decreased activity tolerance;Decreased mobility;Pain;Decreased knowledge of use of DME PT Treatment Interventions: DME instruction;Gait training;Functional mobility training;Therapeutic activities;Therapeutic exercise;Patient/family education   PT Goals Acute Rehab PT Goals PT Goal Formulation: With patient Time For Goal Achievement: 07/31/12 Potential to Achieve Goals: Good Pt will go Supine/Side to Sit: with modified independence PT Goal: Supine/Side to Sit - Progress: Goal set today Pt will go Sit to Stand: with modified independence PT Goal: Sit to Stand - Progress: Goal set today Pt will go Stand to Sit: with modified independence PT Goal: Stand to Sit - Progress: Goal set today Pt will Ambulate: 51 - 150 feet;with supervision;with least restrictive assistive device PT Goal: Ambulate - Progress: Goal set today  Visit Information  Last PT Received On: 07/24/12 Assistance Needed: +1    Subjective Data  Subjective: I came in because of my back pain and they've ran so many tests yet I still have it.   Prior Functioning  Home Living Lives With: Alone Available Help at Discharge:  (May be staying with son???) Type of Home: Independent living facility Home Access: Level entry Home Layout: One level Bathroom Shower/Tub: Health visitor: Standard Home Adaptive Equipment: Grab bars in  shower;Shower chair with back Prior Function Level of Independence: Independent Driving: No Vocation:  Retired Musician: No difficulties    Cognition  Overall Cognitive Status: Appears within functional limits for tasks assessed/performed Arousal/Alertness: Awake/alert Orientation Level: Appears intact for tasks assessed Behavior During Session: [facility] for tasks performed    Extremity/Trunk Assessment Right Lower Extremity Assessment RLE ROM/Strength/Tone: [facility] for tasks assessed Left Lower Extremity Assessment LLE ROM/Strength/Tone: [facility] for tasks assessed Trunk Assessment Trunk Assessment: Other exceptions (decreaesd rotations, flexion due to R posterior flank pain)   Balance Dynamic Standing Balance Dynamic Standing - Balance Support: During functional activity;No upper extremity supported Dynamic Standing - Level of Assistance: 5: Stand by assistance Dynamic Standing - Comments: Pt pulling up undergarments with both hands, posterior lean of LEs on bed observed for more support  End of Session PT - End of Session Activity Tolerance: Patient limited by pain Patient left: in bed;with call bell/phone within reach;with bed alarm set  GP Functional Assessment Tool Used: clinical judgement Functional Limitation: Mobility: Walking and moving around Mobility: Walking and Moving Around Current Status ([id number]): At least 1 percent but less than 20 percent impaired, limited or restricted Mobility: Walking and Moving Around Goal Status [phone]): At least 1 percent but less than 20 percent impaired, limited or restricted   [name],[name] 07/24/2012, 10:37 AM Pager: [phone]

## 2012-07-24 NOTE — Progress Notes (Addendum)
[facility] PROGRESS NOTE  [name] ZOX:[id number] DOB: 18-Feb-1922 DOA: 07/23/2012 PCP: Rene Paci, MD  Assessment/Plan: 1. Hypoxemia: Probably hypoventilation due to pain. Sat now 95 % on Room air. Incentive spirometry. CT angio negative. Check Sat on ambulation.  2. Increase troponin, EKG changes, probably demand ischemia; In patient with CAD. Decision was to continue with current medications. No further cardiac intervention. Continue with atenolol, plavix, and statin. 3. Back pain: ? Compression fracture T 10 vs muscle strain. Start Lidoicane patch. Patient still requiring IV dilaudid. Continue with oral oxycodone. PT, OT consult. Care management  Consult.  4. Mild dehydration: NSL. Encourage fluid intake. 5.Leukocytosis: resolved.    Code Status: DNR. Family Communication: None at bedside. Care discussed with patient.  Disposition Plan: PT consult, need better pain control.    Consultants:  [facility].   Procedures:  None.  Antibiotics:  None.  HPI/Subjective: Patient still complaining of back pain 8/10. No chest pain. No dyspnea.  She is overwhelm with all test results.   Objective: Filed Vitals:   07/23/12 1545 07/23/12 2142 07/24/12 0448 07/24/12 0607  BP: 156/80 138/70 166/83 136/77  Pulse:  81 78 72  Temp:  97.9 F (36.6 C) 98.2 F (36.8 C)   TempSrc:  Oral Oral   Resp:   19   Height:      Weight:      SpO2:  96% 95%     Intake/Output Summary (Last 24 hours) at 07/24/12 1017 Last data filed at 07/24/12 0900  Gross per 24 hour  Intake 1616.25 ml  Output    660 ml  Net 956.25 ml   Filed Weights   07/23/12 1135  Weight: 58.968 kg (130 lb)    Exam:   General:  No distress.  Cardiovascular: S 1, S 2 RRR  Respiratory: CTA  Abdomen:  Bs present, soft, NT  Data Reviewed: Basic Metabolic Panel:  Lab 07/24/12 8119 07/23/12 1708 07/23/12 0610 07/17/12 1133  NA 135 -- 139 138  K 3.7 -- 3.7 4.5  CL 102 -- 105 105  CO2  23 -- 22 25  GLUCOSE 102* -- 109* 114*  BUN 16 -- 32* 20  CREATININE 0.61 -- 0.80 0.7  CALCIUM 8.8 -- 9.1 9.5  MG -- 1.9 -- --  PHOS -- -- -- --   Liver Function Tests:  Lab 07/17/12 1133  AST 16  ALT 13  ALKPHOS 82  BILITOT 0.6  PROT 7.5  ALBUMIN 4.1   No results found for this basename: LIPASE:5,AMYLASE:5 in the last 168 hours No results found for this basename: AMMONIA:5 in the last 168 hours CBC:  Lab 07/24/12 0509 07/23/12 0610 07/17/12 1133  WBC 9.4 12.8* 8.6  NEUTROABS -- 11.1* 6.3  HGB 11.1* 11.5* 11.4*  HCT 34.4* 35.5* 35.6*  MCV 80.9 81.6 82.9  PLT 330 345 394.0   Cardiac Enzymes:  Lab 07/23/12 1708  CKTOTAL --  CKMB --  CKMBINDEX --  TROPONINI 0.94*   BNP (last 3 results) No results found for this basename: PROBNP:3 in the last 8760 hours CBG: No results found for this basename: GLUCAP:5 in the last 168 hours  No results found for this or any previous visit (from the past 240 hour(s)).   Studies: Dg Chest 2 View  07/23/2012  *RADIOLOGY REPORT*  Clinical Data: Right-sided chest and upper back pain for 1 week. Hypoxia.  CHEST - 2 VIEW  Comparison: Chest radiograph performed 06/28/2010  Findings: The lungs are well-aerated.  Minimal left  basilar atelectasis or scarring is noted.  There is no evidence of pleural effusion or pneumothorax.  The heart is normal in size; calcification is noted in the aortic arch.  No acute osseous abnormalities are seen.  A likely chronic compression deformity is noted at the lower thoracic spine.  IMPRESSION:  1.  Minimal left basilar atelectasis or scarring noted; lungs otherwise clear. 2.  Likely chronic compression deformity noted at the lower thoracic spine.   Original Report Authenticated By: Tonia Ghent, M.D.    Dg Thoracic Spine 2 View  07/23/2012  *RADIOLOGY REPORT*  Clinical Data: Right-sided chest and upper back pain for 1 week.  THORACIC SPINE - 2 VIEW  Comparison: Chest radiograph performed 06/28/2010  Findings:  There is a compression deformity involving vertebral body T10, of indeterminate age.  Would correlate for associated symptoms.  There is no additional evidence for fracture.  Vertebral bodies demonstrate normal alignment.  Intervertebral disc spaces are preserved.  Sclerotic degenerative change is noted along the upper lumbar spine.  The visualized portions of both lungs are clear.  The mediastinum is unremarkable in appearance.  Scattered vascular calcifications are seen.  IMPRESSION: Compression deformity involving vertebral body T10, of indeterminate age.  Would correlate for associated symptoms.  No additional evidence to suggest fracture.  Mild degenerative change noted along the upper lumbar spine.   Original Report Authenticated By: Tonia Ghent, M.D.    Ct Angio Chest W/cm &/or Wo Cm  07/23/2012  *RADIOLOGY REPORT*  Clinical Data: Hypoxia.  Elevated D-dimer.  Right posterior chest pain.  CT ANGIOGRAPHY CHEST  Technique:  Multidetector CT imaging of the chest using the standard protocol during bolus administration of intravenous contrast. Multiplanar reconstructed images including MIPs were obtained and reviewed to evaluate the vascular anatomy.  Contrast: OMNIPAQUE IOHEXOL 350 MG/ML SOLN  Comparison: Chest radiograph 07/23/2012.  Findings: In the medial left breast, there is a 2 cm subcutaneous lesion just to the left of the midline, with a small ulceration/gas centrally.  This probably represents a sebaceous cyst however clinical correlation is recommended. More inferiorly, also to the left of midline is another 13 mm subcutaneous lesion with a similar appearance.  Technically adequate study without pulmonary embolus. Aortic atherosclerosis with extensive calcified and mural plaque in the aorta and branch vessels.  No aneurysm or acute aortic abnormality identified.  Coronary artery atherosclerosis is present. If office based assessment of coronary risk factors has not been performed, it is now  recommended.  No pericardial effusion.  No pleural effusion.  Tortuous and ectatic brachiocephalic artery.  Lungs demonstrate dependent atelectasis.  Underlying emphysema is present.  There is no airspace disease in the lungs.  No aggressive osseous lesions are identified.  T10 compression fractures present with 50% loss of vertebral body height and mild retropulsion.  This is probably chronic, based on sclerosis of the vertebral body.  There is no paravertebral phlegmon.  Incidental imaging the upper abdomen demonstrates a 2 cm right adrenal adenoma.  Abdominal aortic atherosclerosis. The areas of sclerosis are present in the anterior left ribs compatible with old rib fractures.  IMPRESSION: 1.  Technically adequate study.  No pulmonary embolus. 2.  Atherosclerosis and coronary artery disease.  No acute vascular abnormality. 3.  Age indeterminate but likely chronic T10 compression fracture. No paravertebral phlegmon and sclerosis suggest chronicity. 4.  2 cm right adrenal adenoma.   Original Report Authenticated By: Andreas Newport, M.D.     Scheduled Meds:   . atenolol  50 mg  Oral Daily  . clopidogrel  75 mg Oral Q breakfast  . enoxaparin (LOVENOX) injection  40 mg Subcutaneous Q24H  . fluticasone  2 puff Inhalation BID  . Ipratropium-Albuterol  1 puff Inhalation Q6H  . lidocaine  1 patch Transdermal Q24H  . LORazepam  1 mg Oral Once   Continuous Infusions:   Principal Problem:  *Hypoxia Active Problems:  HYPERTENSION  Occlusion and stenosis of carotid artery without mention of cerebral infarction  Back pain  Dehydration  Elevated troponin    Time spent: 35 minutes.    Khilynn Borntreger  [facility] Pager [phone]. If 8PM-8AM, please contact night-coverage at [email or web address], password [facility] 07/24/2012, 10:17 AM  LOS: 1 day

## 2012-07-25 DIAGNOSIS — I2510 Atherosclerotic heart disease of native coronary artery without angina pectoris: Secondary | ICD-10-CM

## 2012-07-25 MED ORDER — SENNOSIDES-DOCUSATE SODIUM 8.6-50 MG PO TABS
1.0000 | ORAL_TABLET | Freq: Two times a day (BID) | ORAL | Status: DC
  Administered 2012-07-26: 1 via ORAL
  Filled 2012-07-25 (×4): qty 1

## 2012-07-25 NOTE — Progress Notes (Signed)
[facility] PROGRESS NOTE  [name] ZOX:[id number] DOB: 03/12/1922 DOA: 07/23/2012 PCP: Rene Paci, MD  Assessment/Plan: 1. Hypoxemia: Probably hypoventilation due to pain. Sat now 95 % on Room air. Incentive spirometry. CT angio negative. Check Sat on ambulation.  2. Increase troponin, EKG changes, probably demand ischemia; In patient with CAD. Decision was to continue with current medications. No further cardiac intervention. Continue with atenolol, plavix, and statin. 3. Back pain: ? Compression fracture T 10 vs muscle strain.  Lidoicane patch. Continue with oral oxycodone. PT, OT consult. Needs 24 hour supervision. Pain better.        4.   Mild dehydration: NSL. Encourage fluid intake.        5.   Leukocytosis: resolved.   Code Status: DNR.  Family Communication: Care discussed with patient.  Disposition Plan: Discharge when arrange made for 24 hour supervision. CM consulted. .  Consultants:  [facility].  Procedures:  None. Antibiotics:  None.   HPI/Subjective: Mild confuse. Back pain better.   Objective: Filed Vitals:   07/24/12 1341 07/24/12 2057 07/25/12 0610 07/25/12 0954  BP: 148/70 157/74 138/55 147/60  Pulse:  71 72 67  Temp: 98.1 F (36.7 C) 98.6 F (37 C) 98.1 F (36.7 C)   TempSrc: Oral Oral Oral   Resp: 18 20 17    Height:      Weight:      SpO2: 91% 91% 95%     Intake/Output Summary (Last 24 hours) at 07/25/12 1356 Last data filed at 07/25/12 1212  Gross per 24 hour  Intake      0 ml  Output    550 ml  Net   -550 ml   Filed Weights   07/23/12 1135  Weight: 58.968 kg (130 lb)    Exam:   General:  No distress.  Cardiovascular: S 1, S 2 RRR  Respiratory: CTA  Abdomen: BS present, soft, NT  Data Reviewed: Basic Metabolic Panel:  Lab 07/24/12 8119 07/23/12 1708 07/23/12 0610  NA 135 -- 139  K 3.7 -- 3.7  CL 102 -- 105  CO2 23 -- 22  GLUCOSE 102* -- 109*  BUN 16 -- 32*  CREATININE 0.61 -- 0.80  CALCIUM 8.8 --  9.1  MG -- 1.9 --  PHOS -- -- --   CBC:  Lab 07/24/12 0509 07/23/12 0610  WBC 9.4 12.8*  NEUTROABS -- 11.1*  HGB 11.1* 11.5*  HCT 34.4* 35.5*  MCV 80.9 81.6  PLT 330 345   Cardiac Enzymes:  Lab 07/23/12 1708  CKTOTAL --  CKMB --  CKMBINDEX --  TROPONINI 0.94*   BNP (last 3 results) No results found for this basename: PROBNP:3 in the last 8760 hours CBG: No results found for this basename: GLUCAP:5 in the last 168 hours  No results found for this or any previous visit (from the past 240 hour(s)).   Studies: No results found.  Scheduled Meds:   . atenolol  50 mg Oral Daily  . clopidogrel  75 mg Oral Q breakfast  . enoxaparin (LOVENOX) injection  40 mg Subcutaneous Q24H  . feeding supplement  237 mL Oral BID BM  . fluticasone  2 puff Inhalation BID  . lidocaine  1 patch Transdermal Q24H  . LORazepam  1 mg Oral Once  . simvastatin  10 mg Oral q1800   Continuous Infusions:   Principal Problem:  *Hypoxia Active Problems:  HYPERTENSION  Occlusion and stenosis of carotid artery without mention of cerebral infarction  Back pain  Dehydration  Elevated troponin    Time spent: 25 minutes.    [name]  [facility] Pager [phone]. If 8PM-8AM, please contact night-coverage at [email or web address], password [facility] 07/25/2012, 1:56 PM  LOS: 2 days

## 2012-07-26 MED ORDER — OXYCODONE HCL 5 MG PO TABS: 5.0000 mg | ORAL_TABLET | Freq: Four times a day (QID) | ORAL | Status: DC | PRN

## 2012-07-26 MED ORDER — OXYCODONE HCL 5 MG PO TABS: 5.0000 mg | ORAL_TABLET | ORAL | Status: DC | PRN

## 2012-07-26 MED ORDER — LIDOCAINE 5 % EX PTCH: 1.0000 | MEDICATED_PATCH | CUTANEOUS | Status: DC

## 2012-07-26 NOTE — Progress Notes (Signed)
Rolling walker ordered through [facility], they are to deliver the walker to the patient's room prior to discharge. Abelino Derrick RN,BSN,MHA

## 2012-07-26 NOTE — Discharge Summary (Signed)
Physician Discharge Summary  [name] ZOX:[id number] DOB: Apr 03, 1922 DOA: 07/23/2012  PCP: Rene Paci, MD  Admit date: 07/23/2012 Discharge date: 07/26/2012  Time spent: 30 minutes  Recommendations for Outpatient Follow-up:  1. Follow with PCP to re evaluate Back Pain.   Discharge Diagnoses:  Principal Problem:  Hypoxia, Resolved.  HYPERTENSION  Occlusion and stenosis of carotid artery without mention of cerebral infarction  Back pain  Dehydration  Elevated troponin   Discharge Condition: Stable.  Diet recommendation: [employer] Healthy.  Filed Weights   07/23/12 1135  Weight: 58.968 kg (130 lb)    History of present illness:  [name] is a 78 y.o. female with a past medical history of hypertension, TIAs who was in her usual state of health till about 2 days ago, when she started noticing pain in the mid back area and towards the flank. Patient says that couple days ago she was picking something off the floor and she held on to something to pull herself up. The back pain started not immediately afterwards, but definitely after that incident. Denies any falls or injuries. There is no radiation of the pain apart from to the right flank. Denies any urinary difficulties. No blood in the urine. Does not have any history of kidney stones. Denies any leg weakness. While she was being evaluated for the back pain in the emergency department she was found to be hypoxic. Her sats were about 87-88% on room air. She denies any chest pain or shortness of breath. No recent illness. She also admitted to a few loose stools in the last couple days. She does admit to smoking in the past.   Hospital Course:  1. Hypoxemia: Probably hypoventilation due to pain. Sat now 95 % on Room air. Incentive spirometry. CT angio negative.  2. Increase troponin, EKG changes, probably demand ischemia; In patient with CAD. Decision was to continue with current medications. No further cardiac intervention.  Continue with atenolol, plavix, and statin. 3. Back pain: ? Compression fracture T 10 vs muscle strain. Lidoicane patch. Continue with oral oxycodone. PT, OT consult recommend PT home. Needs 24 hour supervision. Pain better. Patient only need oral pain medications. She was able to ambulate on the hall with the help of PT. Care management to help arrange 24 hour supervision at home.         4.   Mild dehydration: NSL. Encourage fluid intake.        5.   Leukocytosis: resolved.    Procedures:  None  Consultations:  Cardiology, [place].  Discharge Exam: Filed Vitals:   07/25/12 1538 07/25/12 2217 07/26/12 0630 07/26/12 0956  BP: 115/69 147/72 137/64 165/70  Pulse: 66 81 68 72  Temp: 97.8 F (36.6 C) 98 F (36.7 C) 99.2 F (37.3 C)   TempSrc: Oral Oral Oral   Resp: 18 18 16    Height:      Weight:      SpO2: 93% 98% 93%     General: No distress. Cardiovascular: S 1, S 2 RRR Respiratory: CTA  Discharge Instructions  Discharge Orders    Future Appointments: Provider: Department: Dept Phone: Center:   01/22/2013 10:30 AM Newt Lukes, MD [facility] [phone] [facility]   07/18/2013 11:00 AM [facility] [phone] [facility]   07/18/2013 12:00 PM Evern Bio, NP [facility] [phone] [facility]     Future Orders Please Complete By Expires   Diet - low sodium  heart healthy      Increase activity slowly          Medication List     As of 07/26/2012 10:44 AM    STOP taking these medications         acetaminophen 500 MG tablet   Commonly known as: TYLENOL      TAKE these medications         atenolol 50 MG tablet   Commonly known as: TENORMIN   TAKE 1 BY MOUTH ONCE DAILY      cholecalciferol 1000 UNITS tablet   Commonly known as: VITAMIN D   Take 2,000 Units by mouth 2 (two) times daily.      clopidogrel 75 MG tablet   Commonly known as: PLAVIX   TAKE 1 TABLET  BY MOUTH EVERY DAY      lidocaine 5 %   Commonly known as: LIDODERM   Place 1 patch onto the skin daily. Remove & Discard patch within 12 hours or as directed by MD      oxyCODONE 5 MG immediate release tablet   Commonly known as: Oxy IR/ROXICODONE   Take 1 tablet (5 mg total) by mouth every 4 (four) hours as needed.      pravastatin 20 MG tablet   Commonly known as: PRAVACHOL   TAKE 1 TABLET BY MOUTH EVERY DAY          The results of significant diagnostics from this hospitalization (including imaging, microbiology, ancillary and laboratory) are listed below for reference.    Significant Diagnostic Studies: Dg Chest 2 View  07/23/2012  *RADIOLOGY REPORT*  Clinical Data: Right-sided chest and upper back pain for 1 week. Hypoxia.  CHEST - 2 VIEW  Comparison: Chest radiograph performed 06/28/2010  Findings: The lungs are well-aerated.  Minimal left basilar atelectasis or scarring is noted.  There is no evidence of pleural effusion or pneumothorax.  The heart is normal in size; calcification is noted in the aortic arch.  No acute osseous abnormalities are seen.  A likely chronic compression deformity is noted at the lower thoracic spine.  IMPRESSION:  1.  Minimal left basilar atelectasis or scarring noted; lungs otherwise clear. 2.  Likely chronic compression deformity noted at the lower thoracic spine.   Original Report Authenticated By: Tonia Ghent, M.D.    Dg Thoracic Spine 2 View  07/23/2012  *RADIOLOGY REPORT*  Clinical Data: Right-sided chest and upper back pain for 1 week.  THORACIC SPINE - 2 VIEW  Comparison: Chest radiograph performed 06/28/2010  Findings: There is a compression deformity involving vertebral body T10, of indeterminate age.  Would correlate for associated symptoms.  There is no additional evidence for fracture.  Vertebral bodies demonstrate normal alignment.  Intervertebral disc spaces are preserved.  Sclerotic degenerative change is noted along the upper lumbar  spine.  The visualized portions of both lungs are clear.  The mediastinum is unremarkable in appearance.  Scattered vascular calcifications are seen.  IMPRESSION: Compression deformity involving vertebral body T10, of indeterminate age.  Would correlate for associated symptoms.  No additional evidence to suggest fracture.  Mild degenerative change noted along the upper lumbar spine.   Original Report Authenticated By: Tonia Ghent, M.D.    Ct Angio Chest W/cm &/or Wo Cm  07/23/2012  *RADIOLOGY REPORT*  Clinical Data: Hypoxia.  Elevated D-dimer.  Right posterior chest pain.  CT ANGIOGRAPHY CHEST  Technique:  Multidetector CT imaging of the chest using the standard protocol during bolus administration of intravenous contrast.  Multiplanar reconstructed images including MIPs were obtained and reviewed to evaluate the vascular anatomy.  Contrast: OMNIPAQUE IOHEXOL 350 MG/ML SOLN  Comparison: Chest radiograph 07/23/2012.  Findings: In the medial left breast, there is a 2 cm subcutaneous lesion just to the left of the midline, with a small ulceration/gas centrally.  This probably represents a sebaceous cyst however clinical correlation is recommended. More inferiorly, also to the left of midline is another 13 mm subcutaneous lesion with a similar appearance.  Technically adequate study without pulmonary embolus. Aortic atherosclerosis with extensive calcified and mural plaque in the aorta and branch vessels.  No aneurysm or acute aortic abnormality identified.  Coronary artery atherosclerosis is present. If office based assessment of coronary risk factors has not been performed, it is now recommended.  No pericardial effusion.  No pleural effusion.  Tortuous and ectatic brachiocephalic artery.  Lungs demonstrate dependent atelectasis.  Underlying emphysema is present.  There is no airspace disease in the lungs.  No aggressive osseous lesions are identified.  T10 compression fractures present with 50% loss of  vertebral body height and mild retropulsion.  This is probably chronic, based on sclerosis of the vertebral body.  There is no paravertebral phlegmon.  Incidental imaging the upper abdomen demonstrates a 2 cm right adrenal adenoma.  Abdominal aortic atherosclerosis. The areas of sclerosis are present in the anterior left ribs compatible with old rib fractures.  IMPRESSION: 1.  Technically adequate study.  No pulmonary embolus. 2.  Atherosclerosis and coronary artery disease.  No acute vascular abnormality. 3.  Age indeterminate but likely chronic T10 compression fracture. No paravertebral phlegmon and sclerosis suggest chronicity. 4.  2 cm right adrenal adenoma.   Original Report Authenticated By: Andreas Newport, M.D.     Microbiology: No results found for this or any previous visit (from the past 240 hour(s)).   Labs: Basic Metabolic Panel:  Lab 07/24/12 4540 07/23/12 1708 07/23/12 0610  NA 135 -- 139  K 3.7 -- 3.7  CL 102 -- 105  CO2 23 -- 22  GLUCOSE 102* -- 109*  BUN 16 -- 32*  CREATININE 0.61 -- 0.80  CALCIUM 8.8 -- 9.1  MG -- 1.9 --  PHOS -- -- --   CBC:  Lab 07/24/12 0509 07/23/12 0610  WBC 9.4 12.8*  NEUTROABS -- 11.1*  HGB 11.1* 11.5*  HCT 34.4* 35.5*  MCV 80.9 81.6  PLT 330 345   Cardiac Enzymes:  Lab 07/23/12 1708  CKTOTAL --  CKMB --  CKMBINDEX --  TROPONINI 0.94*   BNP: BNP (last 3 results) No results found for this basename: PROBNP:3 in the last 8760 hours CBG: No results found for this basename: GLUCAP:5 in the last 168 hours     Signed:  Siboney Requejo  [facility] 07/26/2012, 10:44 AM

## 2012-07-26 NOTE — Progress Notes (Signed)
Physical Therapy Treatment Patient Details Name: [name] MRN: [id number] DOB: 08-04-1921 Today's Date: 07/26/2012 Time: 4098-1191 PT Time Calculation (min): 23 min  PT Assessment / Plan / Recommendation Comments on Treatment Session  Pt doing well with mobility and requires min/guard assist for ambulation for safety with cues for maintaining position inside of RW.  Continues to have increased back pain, limiting pts distance.  Educated pt on back precautions and sleeping with pillow under knees in supine and between knees in sidelying.  Feel that pt would benefit from HHPT to maximize pts independence and function.     Follow Up Recommendations  Home health PT;Supervision/Assistance - 24 hour     Does the patient have the potential to tolerate intense rehabilitation     Barriers to Discharge        Equipment Recommendations  Rolling walker with 5" wheels    Recommendations for Other Services    Frequency Min 3X/week   Plan Discharge plan needs to be updated    Precautions / Restrictions Precautions Precautions: Fall Restrictions Weight Bearing Restrictions: No   Pertinent Vitals/Pain 6-8/10 back pain with certain movements.  RN and MD aware.      Mobility  Bed Mobility Bed Mobility: Sit to Supine Sit to Supine: 5: Supervision Details for Bed Mobility Assistance: increased time, continue to recommend that pt log roll to prevent further injury/pain, however she got into bed her own way.  Cues for pillow placement to decrease pressure on spine and for hot packs when having muscle spasms.  Transfers Transfers: Sit to Stand;Stand to Sit Sit to Stand: 5: Supervision;With upper extremity assist;From bed Stand to Sit: 5: Supervision;With upper extremity assist;With armrests;To chair/3-in-1 Details for Transfer Assistance: Cues for hand placement, safety and keeping RW with her until all the way at chair.  Ambulation/Gait Ambulation/Gait Assistance: 4: Min guard Ambulation  Distance (Feet): 50 Feet Assistive device: Rolling walker Ambulation/Gait Assistance Details: Mod cues for maintaining position inside of RW with ambulation and esp when making turns. No SOB noted with ambulation, however decreased distance due to increased back pain.  Gait Pattern: Step-through pattern;Decreased trunk rotation Gait velocity: decreased    Exercises     PT Diagnosis:    PT Problem List:   PT Treatment Interventions:     PT Goals Acute Rehab PT Goals PT Goal Formulation: With patient Time For Goal Achievement: 07/31/12 Potential to Achieve Goals: Good Pt will go Sit to Stand: with modified independence PT Goal: Sit to Stand - Progress: Progressing toward goal Pt will go Stand to Sit: with modified independence PT Goal: Stand to Sit - Progress: Progressing toward goal Pt will Ambulate: 51 - 150 feet;with supervision;with least restrictive assistive device PT Goal: Ambulate - Progress: Progressing toward goal  Visit Information  Last PT Received On: 07/26/12 Assistance Needed: +1    Subjective Data  Subjective: I'm not ready to go home yet.  I need a couple more days.    Cognition  Overall Cognitive Status: Appears within functional limits for tasks assessed/performed Arousal/Alertness: Awake/alert Orientation Level: Appears intact for tasks assessed Behavior During Session: Agitated    Balance     End of Session PT - End of Session Activity Tolerance: Patient limited by pain Patient left: in bed;with call bell/phone within reach;with bed alarm set Nurse Communication: Mobility status;Need for lift equipment   GP     Page, Meribeth Mattes 07/26/2012, 10:35 AM

## 2012-07-26 NOTE — Progress Notes (Signed)
Talked to patient about DCP/ HHC; patient resides in an Independent Living Facility and plans to return there at discharge; patient wants to stay in the hospital for another 3-4 days. CM informed the patient that once her Attending MD deems her as medically stable for discharge, she cannot stay in the hospital. Patient stated "she is not my doctor, my doctor is Dr Felicity Coyer". CM informed the patient that the hospitalist is taking care of her in the hospital and Dr Felicity Coyer will see her in the office when she is discharged. Offered HHC choices to the patient, she refused stated " I do not want anyone that I do not know come into my home". CM called the patient's daughter with her permission Graciella Belton [phone] - update given, pt daughter is to arrange for someone to stay with her that she knows and is ok with the discharge. Lots of emotional support given, patient stated " I just want to rest here for a few more days". CM offered SNF placement, patient refused. Also informed patient that if she change her mind about wanting HHC services to call her PCP and they can arrange it from the MD office- CM informed the daughter of this also.

## 2012-07-26 NOTE — Clinical Documentation Improvement (Signed)
RESPIRATORY FAILURE DOCUMENTATION CLARIFICATION QUERY   THIS DOCUMENT IS NOT A PERMANENT PART OF THE MEDICAL RECORD  TO RESPOND TO THE THIS QUERY, FOLLOW THE INSTRUCTIONS BELOW:  1. If needed, update documentation for the patient's encounter via the notes activity.  2. Access this query again and click edit on the In [employer].  3. After updating, or not, click F2 to complete all highlighted (required) fields concerning your review. Select "additional documentation in the medical record" OR "no additional documentation provided".  4. Click Sign note button.  5. The deficiency will fall out of your In Basket *Please let us know if you are not able to complete this workflow by phone or e-mail (listed below).  Please update your documentation within the medical record to reflect your response to this query.                                                                                    07/26/12  Dear Dr. Sunnie Nielsen, [facility],  In a better effort to capture your patient's severity of illness, reflect appropriate length of stay and utilization of resources, a review of the patient medical record has revealed the following indicators.    Based on your clinical judgment, please clarify and document in a progress note and/or discharge summary the clinical condition associated with the following supporting information:  In responding to this query please exercise your independent judgment.  The fact that a query is asked, does not imply that any particular answer is desired or expected.  Based on your clinical judgment can you provide a diagnosis that represents the below listed clinical indicators?   In this patient admitted with hypoxia a review of the medical record reveals the following::   left basilar atelectasis per CXR 07/23/12   per ED notes  Significant h/o smoking  SVT per pn 07/23/12  D-dimer elevated at 4.36  O2 @ 2.5 L/[place]  Treatment:  COMBIVENT ABG planned  per HP  Clarification Needed   Please clarify the underlying cause for the hypoxia  and document in the pn or d/c summary.  Thank you for all that you do for our patients!   Possible Clinical Conditions?   _______Other Condition________________ _______Cannot Clinically Determine    Supporting Information:  Risk Factors:  Signs&Symptoms: Hypoxia, CAD/? NSTEMI, SVT, HTN, demand ischemia,   Diagnostics: D-Dimer 07/23/12 Component     Latest Ref Rng 07/23/2012  D-Dimer, Quant     0.00 - 0.48 ug/mL-FEU 4.36 (H)     Radiology: CXR 07/23/12 Left basilar atelectasis Treatment: Listed above                   You may use possible, probable, or suspect with inpatient documentation. possible, probable, suspected diagnoses MUST be documented at the time of discharge  Reviewed Reviewed.  Thank You,  Enis Slipper  RN, BSN, MSN/Inf, CCDS Clinical Documentation Specialist [facility] Dept Pager: [phone] / E-mail: [email or web address] Health Information Management [place]  Hypoxemia, probably hypoventilation, from pain or narcotics. CT negative for PE. Thanks, see PN, discharge summary

## 2012-07-27 ENCOUNTER — Telehealth: Payer: Self-pay | Admitting: Internal Medicine

## 2012-07-27 NOTE — Telephone Encounter (Signed)
Called patient to f/u from hospital discharge. LMOM for patient to call back to office.

## 2012-07-27 NOTE — Telephone Encounter (Signed)
Patient Information:  Caller Name: Sedalia Muta  Phone: [phone]  Patient: [name], [name]  Gender: Female  DOB: August 07, 1921  Age: 78 Years  PCP: Rene Paci (Adults only)  Office Follow Up:  Does the office need to follow up with this patient?: Yes  Instructions For The Office: Patient was discharged from hospital 12-25 and declined rehab at that time. aughter wants names/numbers for possible rehab facilities and possible home health referral. Also has follow up appointment 1-3 due to hospitalization but wants to be seen sooner. Please call and advise if can be seen sooner and referrals that may use.  RN Note:  Patient was also discharged from hospital 12-25 due to back pain. Declined rehab and was told to follow up with PCP if decided wanted to seek rehab. Daughter is requesting referral for possible rehab and home health nursing care.  Symptoms  Reason For Call & Symptoms: Constipation due to taking Oxycodone.  Reviewed Health History In EMR: Yes  Reviewed Medications In EMR: Yes  Reviewed Allergies In EMR: Yes  Reviewed Surgeries / Procedures: Yes  Date of Onset of Symptoms: 07/25/2012  Guideline(s) Used:  Constipation  Disposition Per Guideline:   Home Care  Reason For Disposition Reached:   Mild constipation  Advice Given:  General Constipation Instructions:  Drink adequate liquids.  High Fiber Diet:  Try to eat fresh fruit and vegetables at each meal (peas, prunes, citrus, apples, beans, corn).  High Fiber Diet:  Try to eat fresh fruit and vegetables at each meal (peas, prunes, citrus, apples, beans, corn).  Eat more grain foods (bran flakes, bran muffins, graham crackers, oatmeal, brown rice, and whole wheat bread). Popcorn is a source of fiber.  Liquids:  Drink 6-8 glasses of water a day (Caution: certain medical conditions require fluid restriction).  Liquids:  Drink 6-8 glasses of water a day (Caution: certain medical conditions require fluid restriction).  Prune juice is a natural laxative.  Bulk Laxatives:  Metamucil (psyllium fiber): One teaspoon (5 cc) in a glass of water twice daily.  Bulk Laxatives:  Metamucil (psyllium fiber): One teaspoon (5 cc) in a glass of water twice daily.

## 2012-07-30 ENCOUNTER — Telehealth: Payer: Self-pay | Admitting: Internal Medicine

## 2012-07-30 MED ORDER — OXYCODONE HCL 5 MG PO TABS: 5.0000 mg | ORAL_TABLET | Freq: Four times a day (QID) | ORAL | Status: DC | PRN

## 2012-07-30 NOTE — Telephone Encounter (Signed)
Caller: /Patient; Phone: [phone]; Reason for Call: Follow up to phone call 07/27/12; wants earlier appt than 08/03/12 for follow up post hospitalization.  Appt scheduled 0900 08/02/12.  Krs/can

## 2012-07-30 NOTE — Telephone Encounter (Signed)
Daughter, Diane calling back.  She doesn't have enough Oxycodone tabs to last until her Thursday am appointment.  Asking if she can get enough medication to last until the 9a appt on 1/2.  Would need approximately 20 tabs.  Uses [facility] in [place].

## 2012-07-30 NOTE — Telephone Encounter (Signed)
Pt's daughter informed of rx-placed upfront in cabinet ready for pickup.

## 2012-07-30 NOTE — Telephone Encounter (Signed)
Please advise in VAL's absence.

## 2012-07-30 NOTE — Telephone Encounter (Signed)
done

## 2012-07-31 NOTE — Telephone Encounter (Signed)
Called daughter back she was told that md was out of office & they schedule appt for 08/02/12 will discuss about getting the referral at that visit...Raechel Chute

## 2012-08-02 ENCOUNTER — Ambulatory Visit (INDEPENDENT_AMBULATORY_CARE_PROVIDER_SITE_OTHER): Payer: Medicare Other | Admitting: Internal Medicine

## 2012-08-02 ENCOUNTER — Encounter: Payer: Self-pay | Admitting: Internal Medicine

## 2012-08-02 VITALS — BP 120/84 | HR 64 | Temp 97.5°F | Ht 62.0 in | Wt 121.8 lb

## 2012-08-02 DIAGNOSIS — R627 Adult failure to thrive: Secondary | ICD-10-CM

## 2012-08-02 DIAGNOSIS — M549 Dorsalgia, unspecified: Secondary | ICD-10-CM

## 2012-08-02 DIAGNOSIS — K5900 Constipation, unspecified: Secondary | ICD-10-CM

## 2012-08-02 DIAGNOSIS — S22070A Wedge compression fracture of T9-T10 vertebra, initial encounter for closed fracture: Secondary | ICD-10-CM

## 2012-08-02 MED ORDER — TRAMADOL HCL 50 MG PO TABS: 50.0000 mg | ORAL_TABLET | Freq: Three times a day (TID) | ORAL | Status: DC | PRN

## 2012-08-02 MED ORDER — OXYCODONE HCL 5 MG PO TABS: 5.0000 mg | ORAL_TABLET | ORAL | Status: DC | PRN

## 2012-08-02 MED ORDER — POLYETHYLENE GLYCOL 3350 17 GM/SCOOP PO POWD: 17.0000 g | Freq: Three times a day (TID) | ORAL | Status: DC | PRN

## 2012-08-02 MED ORDER — METOCLOPRAMIDE HCL 5 MG PO TABS: 5.0000 mg | ORAL_TABLET | Freq: Three times a day (TID) | ORAL | Status: DC

## 2012-08-02 NOTE — Progress Notes (Signed)
Subjective:    Patient ID: [name], female    DOB: [birth year], 79 y.o.   MRN: [id number]  HPI  Here for hospital follow up - Admit date: 07/23/2012 Discharge date: 07/26/2012  Time spent: 30 minutes  Recommendations for Outpatient Follow-up:   1. Follow with PCP to re evaluate Back Pain.   Discharge Diagnoses:   Principal Problem:  Hypoxia, Resolved.  HYPERTENSION  Occlusion and stenosis of carotid artery without mention of cerebral infarction  Back pain  Dehydration  Elevated troponin  Currently, no back pain but complains of abdominal pain and obstipation  Also reviewed chronic med issues:  hx CVA 07/2010 -  R side weakness, improved -  known hx PVD -90% L ICA stenosis and possible right subclavian steal changes on angio  s/p consult with vasc x 2 for same after 07/2010 CVA  -  vasc recommended CEA in 2011 but pt reports [place] doc prev rec against stent or cea   hypertension - prev on amlodipine and bbloc - now bbloc alone since cva  reports compliance with ongoing medical treatment and no new changes in medication dose or frequency. denies adverse side effects related to current therapy.   dyslipidemia - reports compliance with ongoing medical treatment and no changes in medication dose or frequency. denies adverse side effects related to current therapy.   Past Medical History  Diagnosis Date  . ARTHRITIS   . 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 CORONARY ARTERY DISEASE   . MACULAR DEGENERATION   . PERIPHERAL VASCULAR DISEASE   . Sebaceous cyst     L chin and R neck  . Subclavian steal syndrome   . HYPERTENSION   . HYPERLIPIDEMIA   . Cyst of breast 2007    cyst on breast  . Osteoporosis, post-menopausal     T10 compression fx     Review of Systems  Constitutional: Positive for appetite change. Negative for fever and fatigue.  Eyes: Negative for photophobia and visual disturbance.  Respiratory: Negative for shortness of  breath and wheezing.   Cardiovascular: Negative for chest pain and palpitations.  Gastrointestinal: Positive for abdominal pain, constipation and abdominal distention. Negative for nausea, vomiting and diarrhea.  Musculoskeletal: Positive for back pain.  Neurological: Negative for weakness and headaches.       Objective:   Physical Exam  BP 120/84  Pulse 64  Temp 97.5 F (36.4 C) (Oral)  Ht 5\' 2"  (1.575 m)  Wt 121 lb 12.8 oz (55.248 kg)  BMI 22.28 kg/m2  SpO2 95% Wt Readings from Last 3 Encounters:  08/02/12 121 lb 12.8 oz (55.248 kg)  07/23/12 130 lb (58.968 kg)  07/17/12 129 lb (58.514 kg)   Constitutional: She appears well-developed and well-nourished.  sitting in WC and uncomfortable but no acute distress. dtr at side Neck: Normal range of motion. Neck supple. No JVD present. No thyromegaly present. R bruit without change Cardiovascular: Normal rate, regular rhythm and normal heart sounds.  No murmur heard. No BLE edema. Pulmonary/Chest: Effort normal and breath sounds normal. No respiratory distress. She has no wheezes. Abdomen - firm with mild distension, +BS - no r/g Neurological: She is alert and oriented to person, place, and time. No cranial nerve deficit. Coordination normal. Psychiatric: She has a normal mood and affect. Her behavior is normal. Judgment and thought content normal.   Lab Results  Component Value Date   WBC 9.4 07/24/2012   HGB 11.1* 07/24/2012   HCT  34.4* 07/24/2012   PLT 330 07/24/2012   GLUCOSE 102* 07/24/2012   CHOL 137 07/17/2012   TRIG 106.0 07/17/2012   HDL 49.50 07/17/2012   LDLCALC 66 07/17/2012   ALT 13 07/17/2012   AST 16 07/17/2012   NA 135 07/24/2012   K 3.7 07/24/2012   CL 102 07/24/2012   CREATININE 0.61 07/24/2012   BUN 16 07/24/2012   CO2 23 07/24/2012   TSH 2.18 07/17/2012   INR 0.96 06/28/2010   HGBA1C  Value: 6.0 (NOTE)                                                                       According to the ADA Clinical  Practice Recommendations for 2011, when HbA1c is used as a screening test:   >=6.5%   Diagnostic of Diabetes Mellitus           (if abnormal result  is confirmed)  5.7-6.4%   Increased risk of developing Diabetes Mellitus  References:Diagnosis and Classification of Diabetes Mellitus,Diabetes Care,2011,34(Suppl 1):S62-S69 and Standards of Medical Care in         Diabetes - 2011,Diabetes Care,2011,34  (Suppl 1):S11-S61.* 06/28/2010    Dg Chest 2 View  07/23/2012  *RADIOLOGY REPORT*  Clinical Data: Right-sided chest and upper back pain for 1 week. Hypoxia.  CHEST - 2 VIEW  Comparison: Chest radiograph performed 06/28/2010  Findings: The lungs are well-aerated.  Minimal left basilar atelectasis or scarring is noted.  There is no evidence of pleural effusion or pneumothorax.  The heart is normal in size; calcification is noted in the aortic arch.  No acute osseous abnormalities are seen.  A likely chronic compression deformity is noted at the lower thoracic spine.  IMPRESSION:  1.  Minimal left basilar atelectasis or scarring noted; lungs otherwise clear. 2.  Likely chronic compression deformity noted at the lower thoracic spine.   Original Report Authenticated By: Tonia Ghent, M.D.    Dg Thoracic Spine 2 View  07/23/2012  *RADIOLOGY REPORT*  Clinical Data: Right-sided chest and upper back pain for 1 week.  THORACIC SPINE - 2 VIEW  Comparison: Chest radiograph performed 06/28/2010  Findings: There is a compression deformity involving vertebral body T10, of indeterminate age.  Would correlate for associated symptoms.  There is no additional evidence for fracture.  Vertebral bodies demonstrate normal alignment.  Intervertebral disc spaces are preserved.  Sclerotic degenerative change is noted along the upper lumbar spine.  The visualized portions of both lungs are clear.  The mediastinum is unremarkable in appearance.  Scattered vascular calcifications are seen.  IMPRESSION: Compression deformity involving  vertebral body T10, of indeterminate age.  Would correlate for associated symptoms.  No additional evidence to suggest fracture.  Mild degenerative change noted along the upper lumbar spine.   Original Report Authenticated By: Tonia Ghent, M.D.    Ct Angio Chest W/cm &/or Wo Cm  07/23/2012  *RADIOLOGY REPORT*  Clinical Data: Hypoxia.  Elevated D-dimer.  Right posterior chest pain.  CT ANGIOGRAPHY CHEST  Technique:  Multidetector CT imaging of the chest using the standard protocol during bolus administration of intravenous contrast. Multiplanar reconstructed images including MIPs were obtained and reviewed to evaluate the vascular anatomy.  Contrast: OMNIPAQUE IOHEXOL 350 MG/ML SOLN  Comparison: Chest radiograph 07/23/2012.  Findings: In the medial left breast, there is a 2 cm subcutaneous lesion just to the left of the midline, with a small ulceration/gas centrally.  This probably represents a sebaceous cyst however clinical correlation is recommended. More inferiorly, also to the left of midline is another 13 mm subcutaneous lesion with a similar appearance.  Technically adequate study without pulmonary embolus. Aortic atherosclerosis with extensive calcified and mural plaque in the aorta and branch vessels.  No aneurysm or acute aortic abnormality identified.  Coronary artery atherosclerosis is present. If office based assessment of coronary risk factors has not been performed, it is now recommended.  No pericardial effusion.  No pleural effusion.  Tortuous and ectatic brachiocephalic artery.  Lungs demonstrate dependent atelectasis.  Underlying emphysema is present.  There is no airspace disease in the lungs.  No aggressive osseous lesions are identified.  T10 compression fractures present with 50% loss of vertebral body height and mild retropulsion.  This is probably chronic, based on sclerosis of the vertebral body.  There is no paravertebral phlegmon.  Incidental imaging the upper abdomen  demonstrates a 2 cm right adrenal adenoma.  Abdominal aortic atherosclerosis. The areas of sclerosis are present in the anterior left ribs compatible with old rib fractures.  IMPRESSION: 1.  Technically adequate study.  No pulmonary embolus. 2.  Atherosclerosis and coronary artery disease.  No acute vascular abnormality. 3.  Age indeterminate but likely chronic T10 compression fracture. No paravertebral phlegmon and sclerosis suggest chronicity. 4.  2 cm right adrenal adenoma.   Original Report Authenticated By: Andreas Newport, M.D.        Assessment & Plan:   Back pain, suspect due to T10 -  Obstipation due to narcotics - primary issue today  associated with abdominal pain and anorexia, but abd exam benign Osteoporosis with T10 compression fx - ?acute - will plan MRI to further eval same after GI issues resolved  FTT due to above   Aggressive bowl regimen and minimize narcotics :include reglan and miralax- reviewed in depth with pt/dtr - Pain control: to use tramadol and tylenol in place of higher dose oxyIR HH- PT/OT, aide; family to contact private duty agency for help as needed Pursue T10 cause of back pain after GI issues resolved - follow up next week as needed

## 2012-08-02 NOTE — Patient Instructions (Addendum)
It was good to see you today. We have reviewed your prior records including labs and tests today For pain: use oxyIR only for SEVERE back/musculoskelatal pain as needed - try ultram or tylenol between Oxy IR doses - see below For constipation: drink Miralax 3x/day until bowel movement occurs - ok to use senna or ducolax suppositories as well -  we'll make referral to home health as discussed - also call private duty agency as reviewed . Our office will contact you regarding appointment(s) once made. follow up in 1 week to review pain symptoms, call sooner if worse

## 2012-08-03 ENCOUNTER — Telehealth: Payer: Self-pay | Admitting: Internal Medicine

## 2012-08-03 ENCOUNTER — Ambulatory Visit: Payer: Medicare Other | Admitting: Internal Medicine

## 2012-08-03 NOTE — Telephone Encounter (Signed)
Caller: Diane/Child; Phone: [phone]; Reason for Call: Daughter calling to see if ok to give oxycodone if it has not been 8 hours since giving tramedol.  States she did speak with the pharmacist, who reviewed her prescriptions and told her it was safe to give for breakthrough pain.  States currently tramedol is effective for 6 hours or so, but is ordered for q 8 hrs.  Advised may use oxycodone as directed in addition to tramedol, up to, but not more than, every 4 hours.  No further questions or concerns at this time.  Krs/can

## 2012-08-08 ENCOUNTER — Ambulatory Visit (INDEPENDENT_AMBULATORY_CARE_PROVIDER_SITE_OTHER): Payer: Medicare Other | Admitting: Internal Medicine

## 2012-08-08 ENCOUNTER — Encounter: Payer: Self-pay | Admitting: Internal Medicine

## 2012-08-08 VITALS — BP 130/82 | HR 56 | Temp 97.5°F | Wt 120.0 lb

## 2012-08-08 DIAGNOSIS — I10 Essential (primary) hypertension: Secondary | ICD-10-CM

## 2012-08-08 DIAGNOSIS — M8080XA Other osteoporosis with current pathological fracture, unspecified site, initial encounter for fracture: Secondary | ICD-10-CM | POA: Insufficient documentation

## 2012-08-08 DIAGNOSIS — M8440XA Pathological fracture, unspecified site, initial encounter for fracture: Secondary | ICD-10-CM

## 2012-08-08 DIAGNOSIS — M810 Age-related osteoporosis without current pathological fracture: Secondary | ICD-10-CM

## 2012-08-08 DIAGNOSIS — M549 Dorsalgia, unspecified: Secondary | ICD-10-CM

## 2012-08-08 MED ORDER — TRAMADOL HCL 50 MG PO TABS: 50.0000 mg | ORAL_TABLET | Freq: Three times a day (TID) | ORAL | Status: DC | PRN

## 2012-08-08 NOTE — Patient Instructions (Signed)
It was good to see you today. We have reviewed your prior records including labs and tests today For pain: continue tramadol or tylenol as needed Continue working with home health

## 2012-08-08 NOTE — Assessment & Plan Note (Signed)
Off amlodipine since 07/2010- on Bbloc only The current medical regimen is effective;  continue present plan and medications. BP Readings from Last 3 Encounters:  08/08/12 130/82  08/02/12 120/84  07/26/12 181/57

## 2012-08-08 NOTE — Assessment & Plan Note (Signed)
?  Mskel cause 07/2012 - rapidly improving Not requiring narcotics at this time Hold off on any imaging t this time unless symptoms recurrent or uncontrolled Cont HHPT

## 2012-08-08 NOTE — Progress Notes (Signed)
Subjective:    Patient ID: [name], female    DOB: Feb 21, 1922, 79 y.o.   MRN: [id number]  HPI  Here for follow up -Currently, back pain improved, not taking OxyIr -only prn tramadol - 1-3x/day Resolution of abdominal pain and obstipation since last week  Also reviewed chronic med issues:  hx CVA 07/2010 -  R side weakness, improved -  known hx PVD -90% L ICA stenosis and possible right subclavian steal changes on angio  s/p consult with vasc x 2 for same after 07/2010 CVA  -  vasc recommended CEA in 2011 but pt reports [place] doc prev rec against stent or cea   hypertension - prev on amlodipine and bbloc - now bbloc alone since cva  reports compliance with ongoing medical treatment and no new changes in medication dose or frequency. denies adverse side effects related to current therapy.   dyslipidemia - reports compliance with ongoing medical treatment and no changes in medication dose or frequency. denies adverse side effects related to current therapy.   Past Medical History  Diagnosis Date  . ARTHRITIS   . 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 CORONARY ARTERY DISEASE   . MACULAR DEGENERATION   . PERIPHERAL VASCULAR DISEASE   . Sebaceous cyst     L chin and R neck  . Subclavian steal syndrome   . HYPERTENSION   . HYPERLIPIDEMIA   . Cyst of breast 2007    cyst on breast  . Osteoporosis, post-menopausal     T10 compression fx     Review of Systems  Constitutional: Negative for fever, appetite change and fatigue.  Respiratory: Negative for shortness of breath and wheezing.   Cardiovascular: Negative for chest pain and palpitations.  Gastrointestinal: Negative for nausea, vomiting, abdominal pain, diarrhea, constipation and abdominal distention.  Musculoskeletal: Positive for back pain.  Neurological: Negative for weakness and headaches.       Objective:   Physical Exam  BP 130/82  Pulse 56  Temp 97.5 F (36.4 C) (Oral)  Wt 120 lb  (54.432 kg)  SpO2 94% Wt Readings from Last 3 Encounters:  08/08/12 120 lb (54.432 kg)  08/02/12 121 lb 12.8 oz (55.248 kg)  07/23/12 130 lb (58.968 kg)   Constitutional: She appears well-developed and well-nourished.  sitting in chair, relatively comfortably but no acute distress. dtr at side Neck: Normal range of motion. Neck supple. No JVD present. No thyromegaly present. R bruit without change Cardiovascular: Normal rate, regular rhythm and normal heart sounds.  No murmur heard. No BLE edema. Pulmonary/Chest: Effort normal and breath sounds normal. No respiratory distress. She has no wheezes. Abdomen - soft, no distension, +BS - no r/g Neurological: She is alert and oriented to person, place, and time. No cranial nerve deficit. Coordination normal. Psychiatric: She has a normal mood and affect. Her behavior is normal. Judgment and thought content normal.   Lab Results  Component Value Date   WBC 9.4 07/24/2012   HGB 11.1* 07/24/2012   HCT 34.4* 07/24/2012   PLT 330 07/24/2012   GLUCOSE 102* 07/24/2012   CHOL 137 07/17/2012   TRIG 106.0 07/17/2012   HDL 49.50 07/17/2012   LDLCALC 66 07/17/2012   ALT 13 07/17/2012   AST 16 07/17/2012   NA 135 07/24/2012   K 3.7 07/24/2012   CL 102 07/24/2012   CREATININE 0.61 07/24/2012   BUN 16 07/24/2012   CO2 23 07/24/2012   TSH 2.18 07/17/2012  INR 0.96 06/28/2010   HGBA1C  Value: 6.0 (NOTE)                                                                       According to the ADA Clinical Practice Recommendations for 2011, when HbA1c is used as a screening test:   >=6.5%   Diagnostic of Diabetes Mellitus           (if abnormal result  is confirmed)  5.7-6.4%   Increased risk of developing Diabetes Mellitus  References:Diagnosis and Classification of Diabetes Mellitus,Diabetes Care,2011,34(Suppl 1):S62-S69 and Standards of Medical Care in         Diabetes - 2011,Diabetes Care,2011,34  (Suppl 1):S11-S61.* 06/28/2010    Dg Chest 2  View  07/23/2012  *RADIOLOGY REPORT*  Clinical Data: Right-sided chest and upper back pain for 1 week. Hypoxia.  CHEST - 2 VIEW  Comparison: Chest radiograph performed 06/28/2010  Findings: The lungs are well-aerated.  Minimal left basilar atelectasis or scarring is noted.  There is no evidence of pleural effusion or pneumothorax.  The heart is normal in size; calcification is noted in the aortic arch.  No acute osseous abnormalities are seen.  A likely chronic compression deformity is noted at the lower thoracic spine.  IMPRESSION:  1.  Minimal left basilar atelectasis or scarring noted; lungs otherwise clear. 2.  Likely chronic compression deformity noted at the lower thoracic spine.   Original Report Authenticated By: Tonia Ghent, M.D.    Dg Thoracic Spine 2 View  07/23/2012  *RADIOLOGY REPORT*  Clinical Data: Right-sided chest and upper back pain for 1 week.  THORACIC SPINE - 2 VIEW  Comparison: Chest radiograph performed 06/28/2010  Findings: There is a compression deformity involving vertebral body T10, of indeterminate age.  Would correlate for associated symptoms.  There is no additional evidence for fracture.  Vertebral bodies demonstrate normal alignment.  Intervertebral disc spaces are preserved.  Sclerotic degenerative change is noted along the upper lumbar spine.  The visualized portions of both lungs are clear.  The mediastinum is unremarkable in appearance.  Scattered vascular calcifications are seen.  IMPRESSION: Compression deformity involving vertebral body T10, of indeterminate age.  Would correlate for associated symptoms.  No additional evidence to suggest fracture.  Mild degenerative change noted along the upper lumbar spine.   Original Report Authenticated By: Tonia Ghent, M.D.    Ct Angio Chest W/cm &/or Wo Cm  07/23/2012  *RADIOLOGY REPORT*  Clinical Data: Hypoxia.  Elevated D-dimer.  Right posterior chest pain.  CT ANGIOGRAPHY CHEST  Technique:  Multidetector CT imaging of the  chest using the standard protocol during bolus administration of intravenous contrast. Multiplanar reconstructed images including MIPs were obtained and reviewed to evaluate the vascular anatomy.  Contrast: OMNIPAQUE IOHEXOL 350 MG/ML SOLN  Comparison: Chest radiograph 07/23/2012.  Findings: In the medial left breast, there is a 2 cm subcutaneous lesion just to the left of the midline, with a small ulceration/gas centrally.  This probably represents a sebaceous cyst however clinical correlation is recommended. More inferiorly, also to the left of midline is another 13 mm subcutaneous lesion with a similar appearance.  Technically adequate study without pulmonary embolus. Aortic atherosclerosis with extensive calcified and mural plaque in the aorta  and branch vessels.  No aneurysm or acute aortic abnormality identified.  Coronary artery atherosclerosis is present. If office based assessment of coronary risk factors has not been performed, it is now recommended.  No pericardial effusion.  No pleural effusion.  Tortuous and ectatic brachiocephalic artery.  Lungs demonstrate dependent atelectasis.  Underlying emphysema is present.  There is no airspace disease in the lungs.  No aggressive osseous lesions are identified.  T10 compression fractures present with 50% loss of vertebral body height and mild retropulsion.  This is probably chronic, based on sclerosis of the vertebral body.  There is no paravertebral phlegmon.  Incidental imaging the upper abdomen demonstrates a 2 cm right adrenal adenoma.  Abdominal aortic atherosclerosis. The areas of sclerosis are present in the anterior left ribs compatible with old rib fractures.  IMPRESSION: 1.  Technically adequate study.  No pulmonary embolus. 2.  Atherosclerosis and coronary artery disease.  No acute vascular abnormality. 3.  Age indeterminate but likely chronic T10 compression fracture. No paravertebral phlegmon and sclerosis suggest chronicity. 4.  2 cm right  adrenal adenoma.   Original Report Authenticated By: Andreas Newport, M.D.        Assessment & Plan:   Back pain, suspect due to T10 - slowly improve Obstipation due to narcotics -resolved, off narcotics x 1 week  Pain control: continue to use tramadol and tylenol in place of oxyIR [facility]- PT/OT, aide; family to contact private duty agency for help if/as needed  Time spent with pt/family today 25 minutes, greater than 50% time spent counseling patient on back pain, obstipation and medication review. Also review of prior records

## 2012-08-14 DIAGNOSIS — M549 Dorsalgia, unspecified: Secondary | ICD-10-CM

## 2012-08-14 DIAGNOSIS — I10 Essential (primary) hypertension: Secondary | ICD-10-CM

## 2012-08-14 DIAGNOSIS — M6281 Muscle weakness (generalized): Secondary | ICD-10-CM

## 2012-08-14 DIAGNOSIS — I2510 Atherosclerotic heart disease of native coronary artery without angina pectoris: Secondary | ICD-10-CM

## 2012-08-25 DIAGNOSIS — K5900 Constipation, unspecified: Secondary | ICD-10-CM

## 2012-08-25 DIAGNOSIS — M549 Dorsalgia, unspecified: Secondary | ICD-10-CM

## 2012-08-25 DIAGNOSIS — R627 Adult failure to thrive: Secondary | ICD-10-CM

## 2012-09-09 ENCOUNTER — Other Ambulatory Visit: Payer: Self-pay | Admitting: Internal Medicine

## 2012-09-18 ENCOUNTER — Other Ambulatory Visit: Payer: Self-pay | Admitting: Internal Medicine

## 2012-09-30 IMAGING — CT CT ANGIO HEAD
1 of 10 series · 1 of 33 positions shown · IV contrast (APPLIED)
Comparison: 06/28/2010 MR and CT.

CTA NECK

CLINICAL DATA: Multiple acute infarcts.

CT ANGIOGRAPHY HEAD AND NECK
TECHNIQUE: Multidetector CT imaging of the head and neck was
performed using the standard protocol during bolus administration
of intravenous contrast.  Multiplanar CT image reconstructions
including MIPs were obtained to evaluate the vascular anatomy.
Carotid stenosis measurements (when applicable) are obtained
utilizing NASCET criteria, using the distal internal carotid
diameter as the denominator.
Contrast:  100 ml Omnipaque 350.

[Series 3: head/neck w/o 5.0 b40s · axial · non-contrast · 0.45mm/px · 1 of 60 slices shown]
[im 1/60  soft-tissue]
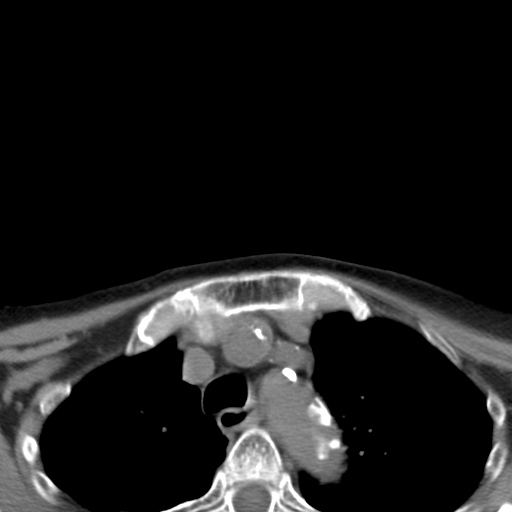

[1 of 33 positions shown; findings below may reference images not displayed]

FINDINGS: The aortic arch is not included on present examination.
Proximal left subclavian artery is occluded.  There is flow beyond
this occlusion  suggesting subclavian steal syndrome.  Marked
narrowing at the origin of the left vertebral artery.

Plaque right innominate artery with mild narrowing.  Mild narrowing
proximal right subclavian artery.  Mild narrowing proximal right
vertebral artery.  Right vertebral artery is dominant size.

Ectatic right common carotid artery.  Motion artifact distal right
common carotid artery.  Plaque right carotid bifurcation with 66%
diameter stenosis proximal right internal carotid artery.  Distal
vertical cervical segment right internal carotid artery ectasia
with mild to moderate narrowing.

Proximal aspect of the left common carotid artery not imaged.
Artifact distal left common carotid artery.  Prominent plaque
proximal left internal carotid artery with 90% diameter stenosis.

Heterogeneous thyroid gland with calcification on the left and
slight nodularity.  This can be follow with thyroid ultrasound.

 Review of the MIP images confirms the above findings.
IMPRESSION: 90% diameter stenosis proximal left internal carotid artery.

66% diameter stenosis proximal right internal carotid artery.
Distal vertical segment right internal carotid artery ectasia and
mild to moderate narrowing.

The aortic arch is not included on present examination.

Proximal left subclavian artery is occluded.  There is flow beyond
this occlusion  suggesting subclavian steal syndrome.  Marked
narrowing at the origin of the left vertebral artery.

Plaque right innominate artery with mild narrowing.  Mild narrowing
proximal right subclavian artery.  Mild narrowing proximal right
vertebral artery.  Right vertebral artery is dominant size.

Heterogeneous thyroid gland with calcification on the left and
slight nodularity.  This can be follow with thyroid ultrasound.

CTA HEAD
FINDINGS: Scattered acute infarcts infratentorial and
supratentorial region with largest  acute infarct left parietal
lobe.  Prominent small vessel disease type changes and global
atrophy.  No intracranial hemorrhage.  No intracranial enhancing
lesion.

Moderate tandem stenoses of the distal left vertebral artery.  Mild
narrowing distal basilar artery.

Prominent calcification with moderate narrowing of the cavernous
segment of the internal carotid arteries bilaterally.

Mild narrowing of the M1 segment of the middle cerebral arteries
bilaterally.

 Review of the MIP images confirms the above findings.
IMPRESSION: Scattered acute infarcts infratentorial and supratentorial region
with largest  acute infarct left parietal lobe.

Moderate tandem stenoses of the distal left vertebral artery.  Mild
narrowing distal basilar artery.

Prominent calcification with moderate narrowing of the cavernous
segment of the internal carotid arteries bilaterally.

Mild narrowing of the M1 segment of the middle cerebral arteries
bilaterally.

## 2013-01-02 ENCOUNTER — Encounter: Payer: Self-pay | Admitting: Internal Medicine

## 2013-01-02 ENCOUNTER — Ambulatory Visit (INDEPENDENT_AMBULATORY_CARE_PROVIDER_SITE_OTHER): Payer: Medicare Other | Admitting: Internal Medicine

## 2013-01-02 VITALS — BP 142/84 | HR 60 | Temp 97.6°F | Wt 121.1 lb

## 2013-01-02 DIAGNOSIS — I739 Peripheral vascular disease, unspecified: Secondary | ICD-10-CM

## 2013-01-02 DIAGNOSIS — I10 Essential (primary) hypertension: Secondary | ICD-10-CM

## 2013-01-02 DIAGNOSIS — E785 Hyperlipidemia, unspecified: Secondary | ICD-10-CM

## 2013-01-02 MED ORDER — PRAVASTATIN SODIUM 20 MG PO TABS: 20.0000 mg | ORAL_TABLET | Freq: Every day | ORAL | Status: DC

## 2013-01-02 MED ORDER — ATENOLOL 50 MG PO TABS: 50.0000 mg | ORAL_TABLET | Freq: Every day | ORAL | Status: DC

## 2013-01-02 MED ORDER — CLOPIDOGREL BISULFATE 75 MG PO TABS: 75.0000 mg | ORAL_TABLET | Freq: Every day | ORAL | Status: DC

## 2013-01-02 NOTE — Assessment & Plan Note (Signed)
On statin - check annually The current medical regimen is effective;  continue present plan and medications.  

## 2013-01-02 NOTE — Assessment & Plan Note (Signed)
Off amlodipine since 07/2010- on Bbloc only The current medical regimen is effective;  continue present plan and medications.  BP Readings from Last 3 Encounters:  01/02/13 142/84  08/08/12 130/82  08/02/12 120/84

## 2013-01-02 NOTE — Assessment & Plan Note (Signed)
90% left ICAS on angio - s/p vasc consult x 2 for this in 2011 and rec CEA, not stent -  pt remains concerned as prior eval in [place] recommended "med mgmt" per pt - - explained this prev rec was prior to CVA event 07/2010 pt will cont to consider and f/u VVS if decides to proceed - planing to continue med mgmt Doppler 07/2011, 12/2011, 07/2012 with vvs - improved to 40-59% - stable - follow up in 6 months -1 year

## 2013-01-02 NOTE — Patient Instructions (Signed)
It was good to see you today. We have reviewed your prior records including labs and tests today we'll make referral for carotid duplex . Our office will contact you regarding appointment(s) once made. Please schedule followup in 6 months for lab work, call sooner if problems.

## 2013-01-02 NOTE — Progress Notes (Signed)
Subjective:    Patient ID: [name], female    DOB: 01-Apr-1922, 79 y.o.   MRN: [id number]  HPI  Here for follow up - reviewed chronic med issues:  hx CVA 07/2010 -  R side weakness, improved -  known hx PVD -90% L ICA stenosis and possible right subclavian steal changes on angio  s/p consult with vasc x 2 for same after 07/2010 CVA  -  vasc recommended CEA in 2011 but pt reports [place] doc prev rec against stent or cea   hypertension - prev on amlodipine and bbloc - now bbloc alone since cva  reports compliance with ongoing medical treatment and no new changes in medication dose or frequency. denies adverse side effects related to current therapy.   dyslipidemia - reports compliance with ongoing medical treatment and no changes in medication dose or frequency. denies adverse side effects related to current therapy.   Past Medical History  Diagnosis Date  . ARTHRITIS   . 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 CORONARY ARTERY DISEASE   . MACULAR DEGENERATION   . PERIPHERAL VASCULAR DISEASE   . Sebaceous cyst     L chin and R neck  . Subclavian steal syndrome   . HYPERTENSION   . HYPERLIPIDEMIA   . Cyst of breast 2007    cyst on breast  . 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  Eyes: Negative for photophobia and visual disturbance.  Respiratory: Negative for shortness of breath and wheezing.   Cardiovascular: Negative for chest pain and palpitations.  Neurological: Negative for weakness and headaches.       Objective:   Physical Exam  BP 142/84  Pulse 60  Temp(Src) 97.6 F (36.4 C) (Oral)  Wt 121 lb 1.9 oz (54.94 kg)  BMI 22.15 kg/m2  SpO2 98% Wt Readings from Last 3 Encounters:  01/02/13 121 lb 1.9 oz (54.94 kg)  08/08/12 120 lb (54.432 kg)  08/02/12 121 lb 12.8 oz (55.248 kg)   Constitutional: She appears well-developed and well-nourished.  No distress.  Neck: Normal range of motion. Neck supple. No JVD present. No thyromegaly present. R bruit without change Cardiovascular: Normal rate, regular rhythm and normal heart sounds.  No murmur heard. No BLE edema. Pulmonary/Chest: Effort normal and breath sounds normal. No respiratory distress. She has no wheezes. Neurological: She is alert and oriented to person, place, and time. No cranial nerve deficit. Coordination normal. Psychiatric: She has a normal mood and affect. Her behavior is normal. Judgment and thought content normal.   Lab Results  Component Value Date   WBC 9.4 07/24/2012   HGB 11.1* 07/24/2012   HCT 34.4* 07/24/2012   PLT 330 07/24/2012   GLUCOSE 102* 07/24/2012   CHOL 137 07/17/2012   TRIG 106.0 07/17/2012   HDL 49.50 07/17/2012   LDLCALC 66 07/17/2012   ALT 13 07/17/2012   AST 16 07/17/2012   NA 135 07/24/2012   K 3.7 07/24/2012   CL 102 07/24/2012   CREATININE 0.61 07/24/2012   BUN 16 07/24/2012   CO2 23 07/24/2012   TSH 2.18 07/17/2012   INR 0.96 06/28/2010   HGBA1C  Value: 6.0 (NOTE)  According to the ADA Clinical Practice Recommendations for 2011, when HbA1c is used as a screening test:   >=6.5%   Diagnostic of Diabetes Mellitus           (if abnormal result  is confirmed)  5.7-6.4%   Increased risk of developing Diabetes Mellitus  References:Diagnosis and Classification of Diabetes Mellitus,Diabetes Care,2011,34(Suppl 1):S62-S69 and Standards of Medical Care in         Diabetes - 2011,Diabetes Care,2011,34  (Suppl 1):S11-S61.* 06/28/2010    Carotid US 12/2011 - L ICAS 40-59% (improved from 07/2011 60-79%)      Assessment & Plan:  See problem list. Medications and labs reviewed today.

## 2013-01-10 ENCOUNTER — Encounter (INDEPENDENT_AMBULATORY_CARE_PROVIDER_SITE_OTHER): Payer: Medicare Other

## 2013-01-10 DIAGNOSIS — I739 Peripheral vascular disease, unspecified: Secondary | ICD-10-CM

## 2013-01-10 DIAGNOSIS — I6529 Occlusion and stenosis of unspecified carotid artery: Secondary | ICD-10-CM

## 2013-01-22 ENCOUNTER — Ambulatory Visit: Payer: Medicare Other | Admitting: Internal Medicine

## 2013-07-10 ENCOUNTER — Encounter: Payer: Self-pay | Admitting: Internal Medicine

## 2013-07-10 ENCOUNTER — Other Ambulatory Visit: Payer: Self-pay | Admitting: Internal Medicine

## 2013-07-10 ENCOUNTER — Other Ambulatory Visit (INDEPENDENT_AMBULATORY_CARE_PROVIDER_SITE_OTHER): Payer: BC Managed Care – HMO

## 2013-07-10 ENCOUNTER — Ambulatory Visit (INDEPENDENT_AMBULATORY_CARE_PROVIDER_SITE_OTHER): Payer: BC Managed Care – HMO | Admitting: Internal Medicine

## 2013-07-10 VITALS — BP 140/82 | HR 58 | Temp 97.2°F | Wt 122.0 lb

## 2013-07-10 DIAGNOSIS — R7309 Other abnormal glucose: Secondary | ICD-10-CM

## 2013-07-10 DIAGNOSIS — E785 Hyperlipidemia, unspecified: Secondary | ICD-10-CM

## 2013-07-10 DIAGNOSIS — R5381 Other malaise: Secondary | ICD-10-CM

## 2013-07-10 DIAGNOSIS — I739 Peripheral vascular disease, unspecified: Secondary | ICD-10-CM

## 2013-07-10 DIAGNOSIS — Z0000 Encounter for general adult medical examination without abnormal findings: Secondary | ICD-10-CM

## 2013-07-10 DIAGNOSIS — R739 Hyperglycemia, unspecified: Secondary | ICD-10-CM

## 2013-07-10 DIAGNOSIS — I10 Essential (primary) hypertension: Secondary | ICD-10-CM

## 2013-07-10 LAB — CBC WITH DIFFERENTIAL/PLATELET
Basophils Absolute: 0 10*3/uL (ref 0.0–0.1)
Basophils Relative: 0.5 % (ref 0.0–3.0)
Eosinophils Absolute: 0.1 10*3/uL (ref 0.0–0.7)
Lymphocytes Relative: 20.7 % (ref 12.0–46.0)
MCHC: 32.8 g/dL (ref 30.0–36.0)
MCV: 86.7 fl (ref 78.0–100.0)
Monocytes Absolute: 0.5 10*3/uL (ref 0.1–1.0)
Monocytes Relative: 5.7 % (ref 3.0–12.0)
Neutrophils Relative %: 71.9 % (ref 43.0–77.0)
Platelets: 302 10*3/uL (ref 150.0–400.0)
RBC: 4.46 Mil/uL (ref 3.87–5.11)
RDW: 16.3 % — ABNORMAL HIGH (ref 11.5–14.6)

## 2013-07-10 LAB — BASIC METABOLIC PANEL
BUN: 21 mg/dL (ref 6–23)
CO2: 28 mEq/L (ref 19–32)
Calcium: 9.2 mg/dL (ref 8.4–10.5)
Creatinine, Ser: 0.9 mg/dL (ref 0.4–1.2)
GFR: 64.23 mL/min (ref >60.00)
Glucose, Bld: 104 mg/dL — ABNORMAL HIGH (ref 70–99)

## 2013-07-10 LAB — HEPATIC FUNCTION PANEL
AST: 17 U/L (ref 0–37)
Albumin: 4 g/dL (ref 3.5–5.2)
Alkaline Phosphatase: 79 U/L (ref 39–117)
Bilirubin, Direct: 0.1 mg/dL (ref 0.0–0.3)
Total Bilirubin: 0.6 mg/dL (ref 0.3–1.2)

## 2013-07-10 LAB — LIPID PANEL
Total CHOL/HDL Ratio: 3
Triglycerides: 142 mg/dL (ref 0.0–149.0)

## 2013-07-10 MED ORDER — ATENOLOL 50 MG PO TABS: 50.0000 mg | ORAL_TABLET | Freq: Every day | ORAL | Status: DC

## 2013-07-10 MED ORDER — PRAVASTATIN SODIUM 20 MG PO TABS: 20.0000 mg | ORAL_TABLET | Freq: Every day | ORAL | Status: DC

## 2013-07-10 NOTE — Assessment & Plan Note (Signed)
90% left ICAS on angio - s/p vasc consult x 2 for this in 2011 and rec CEA, not stent -   pt remains concerned as prior eval in [place] recommended "med mgmt" per pt - - explained this prev rec was prior to CVA event 07/2010  continue med mgmt  Doppler 07/2011, 12/2011, 07/2012 with vvs -R improved to 40-59% -  Stable 12/2012 Ok to follow up in 12 months

## 2013-07-10 NOTE — Assessment & Plan Note (Signed)
Off amlodipine since 07/2010- on Bbloc only The current medical regimen is effective;  continue present plan and medications.  BP Readings from Last 3 Encounters:  07/10/13 140/82  01/02/13 142/84  08/08/12 130/82

## 2013-07-10 NOTE — Patient Instructions (Signed)
It was good to see you today.  We have reviewed your prior records including labs and tests today  Medications reviewed and updated, no changes recommended at this time.  Test(s) ordered today. Your results will be released to MyChart (or called to you) after review, usually within 72hours after test completion. If any changes need to be made, you will be notified at that same time.  Rip Harbour to cancel carotid ultrasound this month - will reschedule in 12/2013  Please schedule followup in 6 months, call sooner if problems.

## 2013-07-10 NOTE — Progress Notes (Signed)
Pre-visit discussion using our clinic review tool. No additional management support is needed unless otherwise documented below in the visit note.  

## 2013-07-10 NOTE — Progress Notes (Signed)
Subjective:    Patient ID: [name], female    DOB: [birth year], 79 y.o.   MRN: [id number]  HPI  patient is here today for annual wellness and physical. Patient feels well and has no complaints.  Diet: heart healthy  Physical activity: sedentary Depression/mood screen: negative Hearing: intact to whispered voice Visual acuity: grossly normal, performs annual eye exam  ADLs: capable Fall risk: none Home safety: good Cognitive evaluation: intact to orientation, naming, recall and repetition EOL planning: adv directives, full code/ I agree  I have personally reviewed and have noted 1. The patient's medical and social history 2. Their use of alcohol, tobacco or illicit drugs 3. Their current medications and supplements 4. The patient's functional ability including ADL's, fall risks, home safety risks and hearing or visual impairment. 5. Diet and physical activities 6. Evidence for depression or mood disorders  Also reviewed chronic medical issues interval medical events:  hx CVA 07/2010 - initially with R side weakness, now improved -  known hx PVD -90% L ICA stenosis and possible subclavian steal changes on angio  s/p consult with vasc x 2 for same after 07/2010 CVA  -  vasc recommended CEA in 2011 but pt reports [place] doc prev rec against stent or cea   Also Hypertension, dyslipidemia -  Past Medical History  Diagnosis Date  . ARTHRITIS   . 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 CORONARY ARTERY DISEASE   . MACULAR DEGENERATION   . PERIPHERAL VASCULAR DISEASE   . Sebaceous cyst     L chin and R neck  . Subclavian steal syndrome   . HYPERTENSION   . HYPERLIPIDEMIA   . Cyst of breast 2007    cyst on breast  . 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  Eyes: Negative for photophobia and visual disturbance.  Respiratory: Negative for shortness of breath  and wheezing.   Cardiovascular: Negative for chest pain and palpitations.  Neurological: Negative for weakness and headaches.       Objective:   Physical Exam BP 140/82  Pulse 58  Temp(Src) 97.2 F (36.2 C) (Oral)  Wt 122 lb (55.339 kg)  SpO2 94% Wt Readings from Last 3 Encounters:  07/10/13 122 lb (55.339 kg)  01/02/13 121 lb 1.9 oz (54.94 kg)  08/08/12 120 lb (54.432 kg)   Constitutional: She appears well-developed and well-nourished. No distress. Dtr at side Neck: Normal range of motion. Neck supple. No JVD present. No thyromegaly present. R bruit without change Cardiovascular: Normal rate, regular rhythm and normal heart sounds.  No murmur heard. No BLE edema. Pulmonary/Chest: Effort normal and breath sounds normal. No respiratory distress. She has no wheezes. Neurological: She is alert and oriented to person, place, and time. No cranial nerve deficit. Coordination normal. Psychiatric: She has a normal mood and affect. Her behavior is normal. Judgment and thought content normal.   Lab Results  Component Value Date   WBC 9.4 07/24/2012   HGB 11.1* 07/24/2012   HCT 34.4* 07/24/2012   PLT 330 07/24/2012   GLUCOSE 102* 07/24/2012   CHOL 137 07/17/2012   TRIG 106.0 07/17/2012   HDL 49.50 07/17/2012   LDLCALC 66 07/17/2012   ALT 13 07/17/2012   AST 16 07/17/2012   NA 135 07/24/2012   K 3.7 07/24/2012   CL 102 07/24/2012   CREATININE 0.61 07/24/2012   BUN 16  07/24/2012   CO2 23 07/24/2012   TSH 2.18 07/17/2012   INR 0.96 06/28/2010   HGBA1C  Value: 6.0 (NOTE)                                                                       According to the ADA Clinical Practice Recommendations for 2011, when HbA1c is used as a screening test:   >=6.5%   Diagnostic of Diabetes Mellitus           (if abnormal result  is confirmed)  5.7-6.4%   Increased risk of developing Diabetes Mellitus  References:Diagnosis and Classification of Diabetes Mellitus,Diabetes Care,2011,34(Suppl  1):S62-S69 and Standards of Medical Care in         Diabetes - 2011,Diabetes Care,2011,34  (Suppl 1):S11-S61.* 06/28/2010    Carotid US 12/2012 - L ICAS 40-59% (stable)       Assessment & Plan:   CPX/v70.0/AWV - Today patient counseled on age appropriate routine health concerns for screening and prevention, each reviewed and up to date or declined. Immunizations reviewed and up to date or declined. Labs ordered and reviewed. Risk factors for depression reviewed and negative. Hearing function and visual acuity are intact. ADLs screened and addressed as needed. Functional ability and level of safety reviewed and appropriate. Education, counseling and referrals performed based on assessed risks today. Patient provided with a copy of personalized plan for preventive services.  Also see problem list. Medications and labs reviewed today.  Fatigue - nonspecific symptoms/exam - check screening labs  Hyperglycemia - check a1c

## 2013-07-10 NOTE — Assessment & Plan Note (Signed)
On statin - check annually The current medical regimen is effective;  continue present plan and medications.  

## 2013-07-10 NOTE — Assessment & Plan Note (Signed)
Borderline diabetes Weight trends reviewed, diet and low carb recommended for weight loss to control risk of progression Check A1c Q6 to 12 months, and as needed Lab Results  Component Value Date   HGBA1C 6.4 07/10/2013

## 2013-07-18 ENCOUNTER — Other Ambulatory Visit (HOSPITAL_COMMUNITY): Payer: Self-pay

## 2013-07-18 ENCOUNTER — Ambulatory Visit: Payer: Self-pay | Admitting: Family

## 2013-07-18 ENCOUNTER — Other Ambulatory Visit (HOSPITAL_COMMUNITY): Payer: Medicare Other

## 2014-01-03 ENCOUNTER — Other Ambulatory Visit: Payer: Self-pay | Admitting: Internal Medicine

## 2014-01-08 ENCOUNTER — Encounter: Payer: Self-pay | Admitting: Internal Medicine

## 2014-01-08 ENCOUNTER — Other Ambulatory Visit (INDEPENDENT_AMBULATORY_CARE_PROVIDER_SITE_OTHER): Payer: Medicare HMO

## 2014-01-08 ENCOUNTER — Ambulatory Visit (INDEPENDENT_AMBULATORY_CARE_PROVIDER_SITE_OTHER): Payer: Medicare HMO | Admitting: Internal Medicine

## 2014-01-08 VITALS — BP 132/90 | HR 72 | Temp 97.6°F | Wt 125.1 lb

## 2014-01-08 DIAGNOSIS — R739 Hyperglycemia, unspecified: Secondary | ICD-10-CM

## 2014-01-08 DIAGNOSIS — R7309 Other abnormal glucose: Secondary | ICD-10-CM

## 2014-01-08 LAB — HEMOGLOBIN A1C: Hgb A1c MFr Bld: 6.4 % (ref 4.6–6.5)

## 2014-01-08 NOTE — Assessment & Plan Note (Signed)
Borderline diabetes Weight trends reviewed, diet and low carb recommended Unless a1c > 7.5, will plan to avoid med tx given advanced age Check A1c Q6 to 12 months, and as needed Lab Results  Component Value Date   HGBA1C 6.4 07/10/2013

## 2014-01-08 NOTE — Progress Notes (Signed)
Pre visit review using our clinic review tool, if applicable. No additional management support is needed unless otherwise documented below in the visit note. 

## 2014-01-08 NOTE — Patient Instructions (Signed)
It was good to see you today.  We have reviewed your prior records including labs and tests today  Test(s) ordered today. Your results will be released to MyChart (or called to you) after review, usually within 72hours after test completion. If any changes need to be made, you will be notified at that same time.  Medications reviewed and updated, no changes recommended at this time.  Please schedule followup in 6 months, call sooner if problems.  

## 2014-01-08 NOTE — Progress Notes (Signed)
Subjective:    Patient ID: [name], female    DOB: September 16, 1921, 80 y.o.   MRN: [id number]  HPI  Patient is here for follow up - borderline high DM 07/2013 Reviewed chronic medical issues and interval medical events  Past Medical History  Diagnosis Date  . ARTHRITIS   . 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 CORONARY ARTERY DISEASE   . MACULAR DEGENERATION   . PERIPHERAL VASCULAR DISEASE   . Sebaceous cyst     L chin and R neck  . Subclavian steal syndrome   . HYPERTENSION   . HYPERLIPIDEMIA   . Cyst of breast 2007    cyst on breast  . 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  Respiratory: Negative for cough and shortness of breath.   Cardiovascular: Negative for chest pain and leg swelling.       Objective:   Physical Exam  BP 132/90  Pulse 72  Temp(Src) 97.6 F (36.4 C) (Oral)  Wt 125 lb 1.9 oz (56.754 kg)  SpO2 95% Wt Readings from Last 3 Encounters:  01/08/14 125 lb 1.9 oz (56.754 kg)  07/10/13 122 lb (55.339 kg)  01/02/13 121 lb 1.9 oz (54.94 kg)   Constitutional: She appears well-developed and well-nourished. No distress.  Neck: Normal range of motion. Neck supple. No JVD present. No thyromegaly present.  Cardiovascular: Normal rate, regular rhythm and normal heart sounds.  No murmur heard. No BLE edema. Pulmonary/Chest: Effort normal and breath sounds normal. No respiratory distress. She has no wheezes.  Psychiatric: She has a normal mood and affect. Her behavior is normal. Judgment and thought content normal.   Lab Results  Component Value Date   WBC 9.2 07/10/2013   HGB 12.7 07/10/2013   HCT 38.7 07/10/2013   PLT 302.0 07/10/2013   GLUCOSE 104* 07/10/2013   CHOL 173 07/10/2013   TRIG 142.0 07/10/2013   HDL 52.70 07/10/2013   LDLCALC 92 07/10/2013   ALT 12 07/10/2013   AST 17 07/10/2013   NA 142  07/10/2013   K 4.1 07/10/2013   CL 105 07/10/2013   CREATININE 0.9 07/10/2013   BUN 21 07/10/2013   CO2 28 07/10/2013   TSH 3.17 07/10/2013   INR 0.96 06/28/2010   HGBA1C 6.4 07/10/2013    Dg Chest 2 View  07/23/2012   *RADIOLOGY REPORT*  Clinical Data: Right-sided chest and upper back pain for 1 week. Hypoxia.  CHEST - 2 VIEW  Comparison: Chest radiograph performed 06/28/2010  Findings: The lungs are well-aerated.  Minimal left basilar atelectasis or scarring is noted.  There is no evidence of pleural effusion or pneumothorax.  The heart is normal in size; calcification is noted in the aortic arch.  No acute osseous abnormalities are seen.  A likely chronic compression deformity is noted at the lower thoracic spine.  IMPRESSION:  1.  Minimal left basilar atelectasis or scarring noted; lungs otherwise clear. 2.  Likely chronic compression deformity noted at the lower thoracic spine.   Original Report Authenticated By: Tonia Ghent, M.D.   Dg Thoracic Spine 2 View  07/23/2012   *RADIOLOGY REPORT*  Clinical Data: Right-sided chest and upper back pain for 1 week.  THORACIC SPINE - 2 VIEW  Comparison: Chest radiograph performed 06/28/2010  Findings: There is a compression deformity involving vertebral body T10, of indeterminate age.  Would correlate  for associated symptoms.  There is no additional evidence for fracture.  Vertebral bodies demonstrate normal alignment.  Intervertebral disc spaces are preserved.  Sclerotic degenerative change is noted along the upper lumbar spine.  The visualized portions of both lungs are clear.  The mediastinum is unremarkable in appearance.  Scattered vascular calcifications are seen.  IMPRESSION: Compression deformity involving vertebral body T10, of indeterminate age.  Would correlate for associated symptoms.  No additional evidence to suggest fracture.  Mild degenerative change noted along the upper lumbar spine.   Original Report Authenticated By: Tonia GhentJeffrey Chang,  M.D.   Ct Angio Chest W/cm &/or Wo Cm  07/23/2012   *RADIOLOGY REPORT*  Clinical Data: Hypoxia.  Elevated D-dimer.  Right posterior chest pain.  CT ANGIOGRAPHY CHEST  Technique:  Multidetector CT imaging of the chest using the standard protocol during bolus administration of intravenous contrast. Multiplanar reconstructed images including MIPs were obtained and reviewed to evaluate the vascular anatomy.  Contrast: 100mL OMNIPAQUE IOHEXOL 350 MG/ML SOLN  Comparison: Chest radiograph 07/23/2012.  Findings: In the medial left breast, there is a 2 cm subcutaneous lesion just to the left of the midline, with a small ulceration/gas centrally.  This probably represents a sebaceous cyst however clinical correlation is recommended. More inferiorly, also to the left of midline is another 13 mm subcutaneous lesion with a similar appearance.  Technically adequate study without pulmonary embolus. Aortic atherosclerosis with extensive calcified and mural plaque in the aorta and branch vessels.  No aneurysm or acute aortic abnormality identified.  Coronary artery atherosclerosis is present. If office based assessment of coronary risk factors has not been performed, it is now recommended.  No pericardial effusion.  No pleural effusion.  Tortuous and ectatic brachiocephalic artery.  Lungs demonstrate dependent atelectasis.  Underlying emphysema is present.  There is no airspace disease in the lungs.  No aggressive osseous lesions are identified.  T10 compression fractures present with 50% loss of vertebral body height and mild retropulsion.  This is probably chronic, based on sclerosis of the vertebral body.  There is no paravertebral phlegmon.  Incidental imaging the upper abdomen demonstrates a 2 cm right adrenal adenoma.  Abdominal aortic atherosclerosis. The areas of sclerosis are present in the anterior left ribs compatible with old rib fractures.  IMPRESSION: 1.  Technically adequate study.  No pulmonary embolus. 2.   Atherosclerosis and coronary artery disease.  No acute vascular abnormality. 3.  Age indeterminate but likely chronic T10 compression fracture. No paravertebral phlegmon and sclerosis suggest chronicity. 4.  2 cm right adrenal adenoma.   Original Report Authenticated By: Andreas NewportGeoffrey Lamke, M.D.       Assessment & Plan:   Problem List Items Addressed This Visit   Hyperglycemia - Primary      Borderline diabetes Weight trends reviewed, diet and low carb recommended Unless a1c > 7.5, will plan to avoid med tx given advanced age Check A1c Q6 to 12 months, and as needed Lab Results  Component Value Date   HGBA1C 6.4 07/10/2013      Relevant Orders      Hemoglobin A1c

## 2014-01-09 ENCOUNTER — Telehealth: Payer: Self-pay

## 2014-01-09 NOTE — Telephone Encounter (Signed)
Pt called to get lab results. Left message on machine

## 2014-01-14 ENCOUNTER — Other Ambulatory Visit: Payer: Self-pay | Admitting: Internal Medicine

## 2014-03-03 ENCOUNTER — Other Ambulatory Visit: Payer: Self-pay

## 2014-03-03 ENCOUNTER — Telehealth: Payer: Self-pay | Admitting: Internal Medicine

## 2014-03-03 MED ORDER — PRAVASTATIN SODIUM 20 MG PO TABS: ORAL_TABLET | ORAL | Status: DC

## 2014-03-03 NOTE — Telephone Encounter (Signed)
Case Manager for [employer] has been trying to reach [name] in regards to filling her provastatin but has not been able to reach her.  She wanted to let Dr. Felicity CoyerLeschber know that she has not filled this med.

## 2014-04-01 HISTORY — PX: HIP PINNING: SHX1757

## 2014-06-17 ENCOUNTER — Telehealth: Payer: Self-pay

## 2014-06-17 NOTE — Telephone Encounter (Signed)
Radovan (Systems developerccupational  Therapist) called for verbal orders to continue OT.   Verbal okay given to OT

## 2014-06-19 ENCOUNTER — Telehealth: Payer: Self-pay

## 2014-06-19 NOTE — Telephone Encounter (Signed)
Case management called. VM was very fast and unclear as to the assistance that they are requesting or needing.  [phone] xt: [phone]  Called Case Management back. The number that left was a fax number.

## 2014-07-08 DIAGNOSIS — I10 Essential (primary) hypertension: Secondary | ICD-10-CM | POA: Diagnosis not present

## 2014-07-08 DIAGNOSIS — Z471 Aftercare following joint replacement surgery: Secondary | ICD-10-CM | POA: Diagnosis not present

## 2014-07-08 DIAGNOSIS — M25551 Pain in right hip: Secondary | ICD-10-CM | POA: Diagnosis not present

## 2014-07-08 DIAGNOSIS — H9190 Unspecified hearing loss, unspecified ear: Secondary | ICD-10-CM | POA: Diagnosis not present

## 2014-07-08 DIAGNOSIS — H3530 Unspecified macular degeneration: Secondary | ICD-10-CM

## 2014-07-10 ENCOUNTER — Encounter: Payer: Self-pay | Admitting: Internal Medicine

## 2014-07-10 ENCOUNTER — Ambulatory Visit (INDEPENDENT_AMBULATORY_CARE_PROVIDER_SITE_OTHER): Payer: Medicare HMO | Admitting: Internal Medicine

## 2014-07-10 VITALS — BP 128/70 | HR 60 | Temp 97.4°F | Wt 112.5 lb

## 2014-07-10 DIAGNOSIS — I10 Essential (primary) hypertension: Secondary | ICD-10-CM

## 2014-07-10 DIAGNOSIS — M8080XS Other osteoporosis with current pathological fracture, unspecified site, sequela: Secondary | ICD-10-CM

## 2014-07-10 MED ORDER — ATENOLOL 50 MG PO TABS: 50.0000 mg | ORAL_TABLET | Freq: Every day | ORAL | Status: DC

## 2014-07-10 MED ORDER — ATENOLOL 25 MG PO TABS: 25.0000 mg | ORAL_TABLET | Freq: Every day | ORAL | Status: DC

## 2014-07-10 MED ORDER — PREGABALIN 25 MG PO CAPS
25.0000 mg | ORAL_CAPSULE | Freq: Three times a day (TID) | ORAL | Status: DC
Start: 2014-07-10 — End: 2014-07-15

## 2014-07-10 NOTE — Progress Notes (Signed)
Pre visit review using our clinic review tool, if applicable. No additional management support is needed unless otherwise documented below in the visit note. 

## 2014-07-10 NOTE — Assessment & Plan Note (Signed)
R hip fx s/p fall 04/2014 - s/p pin repair 04/2104 in CT Continued pain despite oxy 5-10mg  q8h Add lyrica 25 tid - titrate as needed Continue HH PT and /fu local ortho as ongoing

## 2014-07-10 NOTE — Patient Instructions (Signed)
It was good to see you today.  We have reviewed your prior records including labs and tests today  Medications reviewed and updated In addition to oxycodone 3 times daily, begin Lyrica 25 mg 3 times daily Okay to continue atenolol 75 mg daily -50 mg +25 mg daily No other changes recommended at this time.  Your prescription(s) have been submitted to your pharmacy. Please take as directed and contact our office if you believe you are having problem(s) with the medication(s).  Please schedule followup in 3-4 weeks, call sooner if problems.

## 2014-07-10 NOTE — Progress Notes (Signed)
Subjective:    Patient ID: Margaret StadeRuth Melgoza, female    DOB: 06/09/1922, 80 y.o.   MRN: [id number]  HPI  Patient is here for follow up (annual visit to be rescheduled because of need to address acute issues) Reviewed chronic medical issues and interval medical events  Past Medical History  Diagnosis Date  . ARTHRITIS   . 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 CORONARY ARTERY DISEASE   . MACULAR DEGENERATION   . PERIPHERAL VASCULAR DISEASE   . Sebaceous cyst     L chin and R neck  . Subclavian steal syndrome   . HYPERTENSION   . HYPERLIPIDEMIA   . Cyst of breast 2007    cyst on breast  . Osteoporosis, post-menopausal 07/2012    T10 compression fx    Review of Systems  Constitutional: Positive for fatigue. Negative for fever and unexpected weight change.  Respiratory: Negative for cough and shortness of breath.   Cardiovascular: Negative for chest pain and leg swelling.  Musculoskeletal: Positive for gait problem (because of pain). Negative for joint swelling.       Ongoing right hip pain since fracture and repair September 2015, working with local orthopedic for same, unrelieved with oxycodone       Objective:   Physical Exam  BP 128/70 mmHg  Pulse 60  Temp(Src) 97.4 F (36.3 C) (Oral)  Wt 112 lb 8 oz (51.03 kg)  SpO2 94% Wt Readings from Last 3 Encounters:  07/10/14 112 lb 8 oz (51.03 kg)  01/08/14 125 lb 1.9 oz (56.754 kg)  07/10/13 122 lb (55.339 kg)   Constitutional: She appears fatigued, but well-developed and well-nourished. No distress.  using rolling walker. Daughter at side Neck: Normal range of motion. Neck supple. No JVD present. No thyromegaly present.  Cardiovascular: Normal rate, regular rhythm and normal heart sounds.  No murmur heard. No BLE edema. Pulmonary/Chest: Effort normal and breath sounds normal. No respiratory distress. She has no wheezes.  Psychiatric: She has a dysthymic mood and affect. Her behavior is normal. Judgment  and thought content normal.   Lab Results  Component Value Date   WBC 9.2 07/10/2013   HGB 12.7 07/10/2013   HCT 38.7 07/10/2013   PLT 302.0 07/10/2013   GLUCOSE 104* 07/10/2013   CHOL 173 07/10/2013   TRIG 142.0 07/10/2013   HDL 52.70 07/10/2013   LDLCALC 92 07/10/2013   ALT 12 07/10/2013   AST 17 07/10/2013   NA 142 07/10/2013   K 4.1 07/10/2013   CL 105 07/10/2013   CREATININE 0.9 07/10/2013   BUN 21 07/10/2013   CO2 28 07/10/2013   TSH 3.17 07/10/2013   INR 0.96 06/28/2010   HGBA1C 6.4 01/08/2014    Dg Chest 2 View  07/23/2012   *RADIOLOGY REPORT*  Clinical Data: Right-sided chest and upper back pain for 1 week. Hypoxia.  CHEST - 2 VIEW  Comparison: Chest radiograph performed 06/28/2010  Findings: The lungs are well-aerated.  Minimal left basilar atelectasis or scarring is noted.  There is no evidence of pleural effusion or pneumothorax.  The heart is normal in size; calcification is noted in the aortic arch.  No acute osseous abnormalities are seen.  A likely chronic compression deformity is noted at the lower thoracic spine.  IMPRESSION:  1.  Minimal left basilar atelectasis or scarring noted; lungs otherwise clear. 2.  Likely chronic compression deformity noted at the lower thoracic spine.   Original Report Authenticated By: Tinnie GensJeffrey  Cherly Hensen, M.D.   Dg Thoracic Spine 2 View  07/23/2012   *RADIOLOGY REPORT*  Clinical Data: Right-sided chest and upper back pain for 1 week.  THORACIC SPINE - 2 VIEW  Comparison: Chest radiograph performed 06/28/2010  Findings: There is a compression deformity involving vertebral body T10, of indeterminate age.  Would correlate for associated symptoms.  There is no additional evidence for fracture.  Vertebral bodies demonstrate normal alignment.  Intervertebral disc spaces are preserved.  Sclerotic degenerative change is noted along the upper lumbar spine.  The visualized portions of both lungs are clear.  The mediastinum is unremarkable in appearance.   Scattered vascular calcifications are seen.  IMPRESSION: Compression deformity involving vertebral body T10, of indeterminate age.  Would correlate for associated symptoms.  No additional evidence to suggest fracture.  Mild degenerative change noted along the upper lumbar spine.   Original Report Authenticated By: Tonia Ghent, M.D.   Ct Angio Chest W/cm &/or Wo Cm  07/23/2012   *RADIOLOGY REPORT*  Clinical Data: Hypoxia.  Elevated D-dimer.  Right posterior chest pain.  CT ANGIOGRAPHY CHEST  Technique:  Multidetector CT imaging of the chest using the standard protocol during bolus administration of intravenous contrast. Multiplanar reconstructed images including MIPs were obtained and reviewed to evaluate the vascular anatomy.  Contrast: OMNIPAQUE IOHEXOL 350 MG/ML SOLN  Comparison: Chest radiograph 07/23/2012.  Findings: In the medial left breast, there is a 2 cm subcutaneous lesion just to the left of the midline, with a small ulceration/gas centrally.  This probably represents a sebaceous cyst however clinical correlation is recommended. More inferiorly, also to the left of midline is another 13 mm subcutaneous lesion with a similar appearance.  Technically adequate study without pulmonary embolus. Aortic atherosclerosis with extensive calcified and mural plaque in the aorta and branch vessels.  No aneurysm or acute aortic abnormality identified.  Coronary artery atherosclerosis is present. If office based assessment of coronary risk factors has not been performed, it is now recommended.  No pericardial effusion.  No pleural effusion.  Tortuous and ectatic brachiocephalic artery.  Lungs demonstrate dependent atelectasis.  Underlying emphysema is present.  There is no airspace disease in the lungs.  No aggressive osseous lesions are identified.  T10 compression fractures present with 50% loss of vertebral body height and mild retropulsion.  This is probably chronic, based on sclerosis of the vertebral  body.  There is no paravertebral phlegmon.  Incidental imaging the upper abdomen demonstrates a 2 cm right adrenal adenoma.  Abdominal aortic atherosclerosis. The areas of sclerosis are present in the anterior left ribs compatible with old rib fractures.  IMPRESSION: 1.  Technically adequate study.  No pulmonary embolus. 2.  Atherosclerosis and coronary artery disease.  No acute vascular abnormality. 3.  Age indeterminate but likely chronic T10 compression fracture. No paravertebral phlegmon and sclerosis suggest chronicity. 4.  2 cm right adrenal adenoma.   Original Report Authenticated By: Andreas Newport, M.D.       Assessment & Plan:    Problem List Items Addressed This Visit    Essential hypertension    Off amlodipine since 07/2010- on Bbloc only The current medical regimen is effective;  continue present plan and medications.  BP Readings from Last 3 Encounters:  07/10/14 128/70  01/08/14 132/90  07/10/13 140/82      Relevant Medications      atenolol (TENORMIN) tablet      atenolol (TENORMIN) tablet   Osteoporosis with fracture - Primary    R hip fx  s/p fall 04/2014 - s/p pin repair 04/2104 in CT Continued pain despite oxy 5-10mg  q8h Add lyrica 25 tid - titrate as needed Continue HH PT and /fu local ortho as ongoing

## 2014-07-10 NOTE — Assessment & Plan Note (Signed)
Off amlodipine since 07/2010- on Bbloc only The current medical regimen is effective;  continue present plan and medications.  BP Readings from Last 3 Encounters:  07/10/14 128/70  01/08/14 132/90  07/10/13 140/82

## 2014-07-11 ENCOUNTER — Telehealth: Payer: Self-pay | Admitting: Internal Medicine

## 2014-07-11 NOTE — Telephone Encounter (Signed)
emmi mailed  °

## 2014-07-15 ENCOUNTER — Other Ambulatory Visit: Payer: Self-pay

## 2014-07-15 ENCOUNTER — Telehealth: Payer: Self-pay

## 2014-07-15 NOTE — Telephone Encounter (Signed)
Spoke to pt, informed that PA for Lyrica was denied.  MD gave orders for gabapentin 100 mg TID.  Pt wants to wait and speak to daughter before deciding to do it.

## 2014-07-17 ENCOUNTER — Telehealth: Payer: Self-pay | Admitting: Internal Medicine

## 2014-07-17 NOTE — Telephone Encounter (Signed)
Radovan/Occ Therapist from [facility] calling to get orders for OT- 2 X's a week for 3 weeks. [phone]

## 2014-07-17 NOTE — Telephone Encounter (Signed)
Per MD verbal OT to extend it to 3 more weeks at 2 x week.

## 2014-07-30 ENCOUNTER — Telehealth: Payer: Self-pay | Admitting: Internal Medicine

## 2014-07-30 NOTE — Telephone Encounter (Signed)
Pt will be discharged from OT today pt has reached all of her goals.   Radovan/ [facility]  [phone]

## 2014-07-31 NOTE — Telephone Encounter (Signed)
OT stated that pt is still with PT.   He just wanted to make sure that you (PCP) were aware that she was discharged.

## 2014-08-26 DIAGNOSIS — Z471 Aftercare following joint replacement surgery: Secondary | ICD-10-CM | POA: Diagnosis not present

## 2014-09-17 ENCOUNTER — Ambulatory Visit: Payer: Medicare HMO | Admitting: Internal Medicine

## 2014-11-13 ENCOUNTER — Other Ambulatory Visit: Payer: Self-pay | Admitting: Internal Medicine

## 2014-11-21 ENCOUNTER — Other Ambulatory Visit: Payer: Self-pay | Admitting: Internal Medicine

## 2014-11-27 ENCOUNTER — Emergency Department (HOSPITAL_COMMUNITY): Payer: Medicare HMO

## 2014-11-27 ENCOUNTER — Encounter (HOSPITAL_COMMUNITY): Payer: Self-pay | Admitting: Emergency Medicine

## 2014-11-27 ENCOUNTER — Emergency Department (HOSPITAL_COMMUNITY)
Admission: EM | Admit: 2014-11-27 | Discharge: 2014-11-28 | Disposition: A | Discharge status: Home / Self Care | Payer: Medicare HMO | Source: Home / Self Care | Attending: Emergency Medicine | Admitting: Emergency Medicine

## 2014-11-27 DIAGNOSIS — G8929 Other chronic pain: Secondary | ICD-10-CM | POA: Insufficient documentation

## 2014-11-27 DIAGNOSIS — I739 Peripheral vascular disease, unspecified: Secondary | ICD-10-CM | POA: Diagnosis present

## 2014-11-27 DIAGNOSIS — Z8739 Personal history of other diseases of the musculoskeletal system and connective tissue: Secondary | ICD-10-CM

## 2014-11-27 DIAGNOSIS — Z79899 Other long term (current) drug therapy: Secondary | ICD-10-CM | POA: Insufficient documentation

## 2014-11-27 DIAGNOSIS — Z87891 Personal history of nicotine dependence: Secondary | ICD-10-CM

## 2014-11-27 DIAGNOSIS — E785 Hyperlipidemia, unspecified: Secondary | ICD-10-CM | POA: Insufficient documentation

## 2014-11-27 DIAGNOSIS — Z872 Personal history of diseases of the skin and subcutaneous tissue: Secondary | ICD-10-CM | POA: Insufficient documentation

## 2014-11-27 DIAGNOSIS — M546 Pain in thoracic spine: Secondary | ICD-10-CM

## 2014-11-27 DIAGNOSIS — N390 Urinary tract infection, site not specified: Secondary | ICD-10-CM | POA: Diagnosis present

## 2014-11-27 DIAGNOSIS — Z7902 Long term (current) use of antithrombotics/antiplatelets: Secondary | ICD-10-CM

## 2014-11-27 DIAGNOSIS — Z880 Allergy status to penicillin: Secondary | ICD-10-CM

## 2014-11-27 DIAGNOSIS — E876 Hypokalemia: Secondary | ICD-10-CM | POA: Diagnosis present

## 2014-11-27 DIAGNOSIS — Y929 Unspecified place or not applicable: Secondary | ICD-10-CM | POA: Insufficient documentation

## 2014-11-27 DIAGNOSIS — I2510 Atherosclerotic heart disease of native coronary artery without angina pectoris: Secondary | ICD-10-CM | POA: Insufficient documentation

## 2014-11-27 DIAGNOSIS — I10 Essential (primary) hypertension: Secondary | ICD-10-CM | POA: Diagnosis present

## 2014-11-27 DIAGNOSIS — S24109A Unspecified injury at unspecified level of thoracic spinal cord, initial encounter: Secondary | ICD-10-CM | POA: Insufficient documentation

## 2014-11-27 DIAGNOSIS — Z8742 Personal history of other diseases of the female genital tract: Secondary | ICD-10-CM

## 2014-11-27 DIAGNOSIS — Z8669 Personal history of other diseases of the nervous system and sense organs: Secondary | ICD-10-CM

## 2014-11-27 DIAGNOSIS — Y998 Other external cause status: Secondary | ICD-10-CM | POA: Insufficient documentation

## 2014-11-27 DIAGNOSIS — Y939 Activity, unspecified: Secondary | ICD-10-CM

## 2014-11-27 DIAGNOSIS — Z882 Allergy status to sulfonamides status: Secondary | ICD-10-CM

## 2014-11-27 DIAGNOSIS — M1990 Unspecified osteoarthritis, unspecified site: Secondary | ICD-10-CM | POA: Diagnosis present

## 2014-11-27 DIAGNOSIS — X58XXXA Exposure to other specified factors, initial encounter: Secondary | ICD-10-CM | POA: Insufficient documentation

## 2014-11-27 DIAGNOSIS — M549 Dorsalgia, unspecified: Secondary | ICD-10-CM | POA: Diagnosis not present

## 2014-11-27 DIAGNOSIS — Z886 Allergy status to analgesic agent status: Secondary | ICD-10-CM

## 2014-11-27 DIAGNOSIS — M4854XA Collapsed vertebra, not elsewhere classified, thoracic region, initial encounter for fracture: Secondary | ICD-10-CM | POA: Diagnosis not present

## 2014-11-27 MED ORDER — OXYCODONE HCL 5 MG PO TABS
5.0000 mg | ORAL_TABLET | Freq: Once | ORAL | Status: AC
  Administered 2014-11-27: 5 mg via ORAL
  Filled 2014-11-27: qty 1

## 2014-11-27 NOTE — ED Notes (Signed)
Pt reports that she strained a muscle in her back twisting x3 days ago. Pt ran out of her Oxycodone, requesting refill. Denies ShOB, CP, nausea, difficulty urinating, or any other concerns.

## 2014-11-27 NOTE — ED Notes (Signed)
Patient returned from XR. 

## 2014-11-27 NOTE — ED Notes (Signed)
Pt reports she is not currently in pain. Pain mainly occurs with movement and is in the upper back

## 2014-11-27 NOTE — ED Notes (Signed)
Bed: [id number] Expected date:  Expected time:  Means of arrival:  Comments: EMS [id number] yo female left flank pain x 3 days/twisted in bed/rib pain

## 2014-11-28 MED ORDER — OXYCODONE HCL 5 MG PO TABS: 5.0000 mg | ORAL_TABLET | Freq: Three times a day (TID) | ORAL | Status: DC | PRN

## 2014-11-28 NOTE — ED Notes (Signed)
PA at bedside.

## 2014-11-28 NOTE — ED Provider Notes (Signed)
CSN: [id number]     Arrival date & time 11/27/14  2137 History   First MD Initiated Contact with Patient 11/27/14 2154     Chief Complaint  Patient presents with  . Back Pain   [name] is a 81 y.o. female with a history of arthritis, coronary artery disease, hypertension, and osteoporosis who presents to the emergency department complaining of left-sided thoracic back pain with onset 3 days ago. Patient reports she has some chronic back pain but this is worsened recently. She reports taking oxycodone 5 mg 3 times a day for her back pain but recently ran out. The patient reports 3 days ago she was twisting trying to get out of bed and pulled her back. She reports this is when her back pain started. She is complaining of 10 out of 10 left-sided thoracic back pain that is worse with movement. She reports her pain is worse with palpation of her chest and moving. The patient lives at [facility] independent living. She denies personal or family history of DVTs or PEs. She denies personal or family history of blood clotting disorder such as factor V Leiden, protein C or protein S deficiency. She denies any recent surgery, long travel or smoking. Patient denies leg swelling or leg pain. She denies any recent hospital admissions. The patient denies fevers, chills, shortness of breath, abdominal pain, nausea, vomiting, chest pain, shortness of breath, urinary symptoms, hematuria, loss of bladder control, loss of bowel control, numbness, rashes, tingling or weakness.  (Consider location/radiation/quality/duration/timing/severity/associated sxs/prior Treatment) HPI  Past Medical History  Diagnosis Date  . ARTHRITIS   . 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 CORONARY ARTERY DISEASE   . MACULAR DEGENERATION   . PERIPHERAL VASCULAR DISEASE   . Sebaceous cyst     L chin and R neck  . Subclavian steal syndrome   . HYPERTENSION   . HYPERLIPIDEMIA   . Cyst of breast 2007    cyst  on breast  . 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 Tonsillectomy    . Sabacious cyst removed from breast  2011  . Breast surgery  2011    sebacious cyst removal   . Hip pinning Right 04/2014   Family History  Problem Relation Age of Onset  . Heart disease Other     Parent  . Miscarriages / Stillbirths Other   . Heart attack Father   . Cancer Mother   . Hyperlipidemia Brother   . Hypertension Brother   . Stroke Brother    History  Substance Use Topics  . Smoking status: Former Smoker -- 1.00 packs/day for 61 years    Quit date: 09/29/2009  . Smokeless tobacco: Never Used     Comment: Widowed, lives in [facility] living. Moved to [place] from [place] fall 2011 to be near dtr  . Alcohol Use: Yes     Comment: Occasional   OB History    No data available     Review of Systems  Constitutional: Negative for fever and chills.  HENT: Negative for congestion and sore throat.   Eyes: Negative for visual disturbance.  Respiratory: Negative for cough, chest tightness, shortness of breath and wheezing.   Cardiovascular: Negative for chest pain, palpitations and leg swelling.  Gastrointestinal: Negative for nausea, vomiting, abdominal pain and diarrhea.  Genitourinary: Negative for dysuria, urgency, frequency, hematuria,  difficulty urinating and genital sores.  Musculoskeletal: Positive for back pain. Negative for neck pain.  Skin: Negative for rash and wound.  Neurological: Negative for dizziness, weakness, light-headedness, numbness and headaches.      Allergies  Codeine; Penicillins; and Sulfonamide derivatives  Home Medications   Prior to Admission medications   Medication Sig Start Date End Date Taking? Authorizing Provider  atenolol (TENORMIN) 25 MG tablet Take 1 tablet (25 mg total) by mouth daily. Take with 50mg  tab for total 75mg  daily 07/10/14  Yes Newt Lukes, MD    atenolol (TENORMIN) 50 MG tablet Take 1 tablet (50 mg total) by mouth daily. Take with 25mg  tab for total 75mg  daily 07/10/14  Yes Newt Lukes, MD  Cholecalciferol (VITAMIN D3) 2000 UNITS TABS Take by mouth daily.   Yes Historical Provider, MD  clopidogrel (PLAVIX) 75 MG tablet TAKE 1 TABLET (75 MG TOTAL) BY MOUTH DAILY. Patient taking differently: Take 1 tablet by mouth daily. 11/21/14  Yes Newt Lukes, MD  pravastatin (PRAVACHOL) 20 MG tablet TAKE 1 TABLET (20 MG TOTAL) BY MOUTH DAILY. Patient taking differently: Take 20 mg by mouth daily. TAKE 1 TABLET (20 MG TOTAL) BY MOUTH DAILY. 03/03/14  Yes Newt Lukes, MD  oxyCODONE (OXY IR/ROXICODONE) 5 MG immediate release tablet Take 1 tablet (5 mg total) by mouth every 8 (eight) hours as needed for severe pain. 11/28/14   Everlene Farrier, PA-C   BP 153/98 mmHg  Pulse 73  Temp(Src) 98.4 F (36.9 C) (Oral)  Resp 23  SpO2 95% Physical Exam  Constitutional: She is oriented to person, place, and time. She appears well-developed and well-nourished. No distress.  Nontoxic appearing.  HENT:  Head: Normocephalic and atraumatic.  Mouth/Throat: Oropharynx is clear and moist. No oropharyngeal exudate.  Eyes: Conjunctivae are normal. Pupils are equal, round, and reactive to light. Right eye exhibits no discharge. Left eye exhibits no discharge.  Neck: Normal range of motion. Neck supple. No JVD present. No tracheal deviation present.  Cardiovascular: Normal rate, regular rhythm, normal heart sounds and intact distal pulses.  Exam reveals no gallop and no friction rub.   No murmur heard. Bilateral radial, posterior tibialis and dorsalis pedis pulses are intact.   Pulmonary/Chest: Effort normal and breath sounds normal. No respiratory distress. She has no wheezes. She has no rales.    Lungs are clear to auscultation bilaterally  Abdominal: Soft. She exhibits no distension. There is no tenderness. There is no rebound and no guarding.   Abdomen is soft and nontender to palpation.  Musculoskeletal: She exhibits no edema or tenderness.  Patient's pain is reproducible with palpation of her left lateral chest into her thoracic mid back. No back edema, erythema or deformity. Patient is spontaneously moving all extremities in a coordinated fashion exhibiting good strength. No lower extremity edema or tenderness.   Lymphadenopathy:    She has no cervical adenopathy.  Neurological: She is alert and oriented to person, place, and time. Coordination normal.  Sensation is intact to her bilateral upper and lower extremities.   Skin: Skin is warm and dry. No rash noted. She is not diaphoretic. No erythema. No pallor.  Psychiatric: She has a normal mood and affect. Her behavior is normal.  Nursing note and vitals reviewed.   ED Course  Procedures (including critical care time) Labs Review Labs Reviewed - No data to display  Imaging Review Dg Thoracic Spine W/swimmers  11/27/2014   CLINICAL DATA:  Strained muscle at upper back while  twisting. Upper back pain. Initial encounter.  EXAM: THORACIC SPINE - 2 VIEW + SWIMMERS  COMPARISON:  CTA of the chest performed 07/23/2012  FINDINGS: There is no evidence of fracture or subluxation. Vertebral bodies demonstrate normal height and alignment. Mild vacuum phenomenon and endplate sclerotic change are noted along the upper lumbar spine. Mild degenerative change is noted at the lower cervical spine. Remaining intervertebral disc spaces are preserved.  The visualized portions of both lungs are clear. The mediastinum is unremarkable in appearance.  IMPRESSION: No evidence of fracture or subluxation along the thoracic spine.   Electronically Signed   By: Roanna Raider M.D.   On: 11/27/2014 23:51     EKG Interpretation   Date/Time:  Thursday November 27 2014 22:51:37 EDT Ventricular Rate:  74 PR Interval:  186 QRS Duration: 79 QT Interval:  410 QTC Calculation: 455 R Axis:   13 Text  Interpretation:  Sinus rhythm Probable anteroseptal infarct, old  Borderline ST depression, anterolateral leads Baseline wander in lead(s) I  III aVL No significant change since last tracing Confirmed by [facility]   MD, WHITNEY ([id number]) on 11/27/2014 10:57:59 PM      Filed Vitals:   11/27/14 2138 11/27/14 2339 11/28/14 0000  BP: 178/83 185/113 153/98  Pulse: 67 80 73  Temp: 98.4 F (36.9 C)    TempSrc: Oral    Resp: SpO2: 96% 95% 95%     MDM   Meds given in ED:  Medications  oxyCODONE (Oxy IR/ROXICODONE) immediate release tablet 5 mg (5 mg Oral Given 11/27/14 2330)    New Prescriptions   No medications on file    Final diagnoses:  Thoracic back pain   This is a 81 y.o. female with a history of arthritis, coronary artery disease, hypertension, and osteoporosis who presents to the emergency department complaining of left-sided thoracic back pain with onset 3 days ago. Patient reports she has some chronic back pain but this is worsened recently. She reports taking oxycodone 5 mg 3 times a day for her back pain but recently ran out. The patient reports 3 days ago she was twisting trying to get out of bed and pulled her back. On exam the patient is afebrile and nontoxic appearing. She has pain in her thoracic mid back to her left side that is reproducible on palpation. She has no back pain red flags. She has no neuro deficits on exam. Will obtain screening EKG due to the patient's area of her pain. We'll also obtain plain films of her thoracic spine. Will also give her pain medication.  The patient's EKG shows no significant change from her last tracing. No PE risk factors. Low suspicion for ACS or PE. She is not tacypneic, tachycardic or hypoxic.  Plain films of her thoracic spine show no evidence of fracture or subluxation in the thoracic spine. At reevaluation the patient reports feeling better after receiving pain medicine in the ED. I discussed her results. Will discharge her  with a small amount of oxycodone 5 mg until she can follow up with her PCP.  I advised the patient to follow-up with their primary care provider this week. I advised the patient to return to the emergency department with new or worsening symptoms or new concerns. The patient verbalized understanding and agreement with plan.   This patient was discussed with and evaluated by Dr. Anitra Lauth who agrees with assessment and plan.      Everlene Farrier, PA-C 11/28/14 0057  Gwyneth Sprout,  MD 12/01/14 1516

## 2014-11-28 NOTE — Discharge Instructions (Signed)
Back Pain, Adult °Low back pain is very common. About 1 in 5 people have back pain. The cause of low back pain is rarely dangerous. The pain often gets better over time. About half of people with a sudden onset of back pain feel better in just 2 weeks. About 8 in 10 people feel better by 6 weeks.  °CAUSES °Some common causes of back pain include: °· Strain of the muscles or ligaments supporting the spine. °· Wear and tear (degeneration) of the spinal discs. °· Arthritis. °· Direct injury to the back. °DIAGNOSIS °Most of the time, the direct cause of low back pain is not known. However, back pain can be treated effectively even when the exact cause of the pain is unknown. Answering your caregiver's questions about your overall health and symptoms is one of the most accurate ways to make sure the cause of your pain is not dangerous. If your caregiver needs more information, he or she may order lab work or imaging tests (X-rays or MRIs). However, even if imaging tests show changes in your back, this usually does not require surgery. °HOME CARE INSTRUCTIONS °For many people, back pain returns. Since low back pain is rarely dangerous, it is often a condition that people can learn to manage on their own.  °· Remain active. It is stressful on the back to sit or stand in one place. Do not sit, drive, or stand in one place for more than 30 minutes at a time. Take short walks on level surfaces as soon as pain allows. Try to increase the length of time you walk each day. °· Do not stay in bed. Resting more than 1 or 2 days can delay your recovery. °· Do not avoid exercise or work. Your body is made to move. It is not dangerous to be active, even though your back may hurt. Your back will likely heal faster if you return to being active before your pain is gone. °· Pay attention to your body when you  bend and lift. Many people have less discomfort when lifting if they bend their knees, keep the load close to their bodies, and  avoid twisting. Often, the most comfortable positions are those that put less stress on your recovering back. °· Find a comfortable position to sleep. Use a firm mattress and lie on your side with your knees slightly bent. If you lie on your back, put a pillow under your knees. °· Only take over-the-counter or prescription medicines as directed by your caregiver. Over-the-counter medicines to reduce pain and inflammation are often the most helpful. Your caregiver may prescribe muscle relaxant drugs. These medicines help dull your pain so you can more quickly return to your normal activities and healthy exercise. °· Put ice on the injured area. °¨ Put ice in a plastic bag. °¨ Place a towel between your skin and the bag. °¨ Leave the ice on for 15-20 minutes, 03-04 times a day for the first 2 to 3 days. After that, ice and heat may be alternated to reduce pain and spasms. °· Ask your caregiver about trying back exercises and gentle massage. This may be of some benefit. °· Avoid feeling anxious or stressed. Stress increases muscle tension and can worsen back pain. It is important to recognize when you are anxious or stressed and learn ways to manage it. Exercise is a great option. °SEEK MEDICAL CARE IF: °· You have pain that is not relieved with rest or medicine. °· You have pain that does not improve in 1 week. °· You have new symptoms. °· You are generally not feeling well. °SEEK   IMMEDIATE MEDICAL CARE IF:  °· You have pain that radiates from your back into your legs. °· You develop new bowel or bladder control problems. °· You have unusual weakness or numbness in your arms or legs. °· You develop nausea or vomiting. °· You develop abdominal pain. °· You feel faint. °Document Released: 07/18/2005 Document Revised: 01/17/2012 Document Reviewed: 11/19/2013 °[employer]® Patient Information ©2015 [employer]. This information is not intended to replace advice given to you by your health care provider. Make sure you  discuss any questions you have with your health care provider. ° °Back Exercises °Back exercises help treat and prevent back injuries. The goal of back exercises is to increase the strength of your abdominal and back muscles and the flexibility of your back. These exercises should be started when you no longer have back pain. Back exercises include: °· Pelvic Tilt. Lie on your back with your knees bent. Tilt your pelvis until the lower part of your back is against the floor. Hold this position 5 to 10 sec and repeat 5 to 10 times. °· Knee to Chest. Pull first 1 knee up against your chest and hold for 20 to 30 seconds, repeat this with the other knee, and then both knees. This may be done with the other leg straight or bent, whichever feels better. °· Sit-Ups or Curl-Ups. Bend your knees 90 degrees. Start with tilting your pelvis, and do a partial, slow sit-up, lifting your trunk only 30 to 45 degrees off the floor. Take at least 2 to 3 seconds for each sit-up. Do not do sit-ups with your knees out straight. If partial sit-ups are difficult, simply do the above but with only tightening your abdominal muscles and holding it as directed. °· Hip-Lift. Lie on your back with your knees flexed 90 degrees. Push down with your feet and shoulders as you raise your hips a couple inches off the floor; hold for 10 seconds, repeat 5 to 10 times. °· Back arches. Lie on your stomach, propping yourself up on bent elbows. Slowly press on your hands, causing an arch in your low back. Repeat 3 to 5 times. Any initial stiffness and discomfort should lessen with repetition over time. °· Shoulder-Lifts. Lie face down with arms beside your body. Keep hips and torso pressed to floor as you slowly lift your head and shoulders off the floor. °Do not overdo your exercises, especially in the beginning. Exercises may cause you some mild back discomfort which lasts for a few minutes; however, if the pain is more severe, or lasts for more than 15  minutes, do not continue exercises until you see your caregiver. Improvement with exercise therapy for back problems is slow.  °See your caregivers for assistance with developing a proper back exercise program. °Document Released: 08/25/2004 Document Revised: 10/10/2011 Document Reviewed: 05/19/2011 °[employer]® Patient Information ©2015 [employer]. This information is not intended to replace advice given to you by your health care provider. Make sure you discuss any questions you have with your health care provider. ° °

## 2014-11-30 ENCOUNTER — Emergency Department (HOSPITAL_COMMUNITY): Payer: Medicare HMO

## 2014-11-30 ENCOUNTER — Inpatient Hospital Stay (HOSPITAL_COMMUNITY)
Admission: EM | Admit: 2014-11-30 | Discharge: 2014-12-04 | DRG: 543 | Disposition: A | Discharge status: Skilled Nursing Facility | Payer: Medicare HMO | Attending: Internal Medicine | Admitting: Internal Medicine

## 2014-11-30 DIAGNOSIS — N390 Urinary tract infection, site not specified: Secondary | ICD-10-CM

## 2014-11-30 DIAGNOSIS — R079 Chest pain, unspecified: Secondary | ICD-10-CM

## 2014-11-30 DIAGNOSIS — M549 Dorsalgia, unspecified: Secondary | ICD-10-CM | POA: Diagnosis not present

## 2014-11-30 DIAGNOSIS — I10 Essential (primary) hypertension: Secondary | ICD-10-CM | POA: Diagnosis not present

## 2014-11-30 DIAGNOSIS — R8271 Bacteriuria: Secondary | ICD-10-CM | POA: Diagnosis present

## 2014-11-30 DIAGNOSIS — S22000A Wedge compression fracture of unspecified thoracic vertebra, initial encounter for closed fracture: Secondary | ICD-10-CM | POA: Diagnosis present

## 2014-11-30 DIAGNOSIS — R109 Unspecified abdominal pain: Secondary | ICD-10-CM

## 2014-11-30 LAB — COMPREHENSIVE METABOLIC PANEL
ALBUMIN: 4.6 g/dL (ref 3.5–5.0)
ALK PHOS: 104 U/L (ref 38–126)
ALT: 11 U/L — AB (ref 14–54)
AST: 17 U/L (ref 15–41)
Anion gap: 10 (ref 5–15)
BILIRUBIN TOTAL: 0.8 mg/dL (ref 0.3–1.2)
BUN: 30 mg/dL — ABNORMAL HIGH (ref 6–20)
CHLORIDE: 104 mmol/L (ref 101–111)
CO2: 26 mmol/L (ref 22–32)
Calcium: 9.4 mg/dL (ref 8.9–10.3)
Creatinine, Ser: 0.76 mg/dL (ref 0.44–1.00)
GFR calc Af Amer: >60 mL/min (ref >60)
GFR calc non Af Amer: >60 mL/min (ref >60)
Glucose, Bld: 120 mg/dL — ABNORMAL HIGH (ref 70–99)
POTASSIUM: 4.2 mmol/L (ref 3.5–5.1)
Sodium: 140 mmol/L (ref 135–145)
Total Protein: 8.1 g/dL (ref 6.5–8.1)

## 2014-11-30 LAB — URINALYSIS, ROUTINE W REFLEX MICROSCOPIC
BILIRUBIN URINE: NEGATIVE
Glucose, UA: NEGATIVE mg/dL
Ketones, ur: 15 mg/dL — AB
Nitrite: NEGATIVE
Protein, ur: NEGATIVE mg/dL
SPECIFIC GRAVITY, URINE: 1.015 (ref 1.005–1.030)
Urobilinogen, UA: 0.2 mg/dL (ref 0.0–1.0)
pH: 7 (ref 5.0–8.0)

## 2014-11-30 LAB — CBC WITH DIFFERENTIAL/PLATELET
BASOS ABS: 0 10*3/uL (ref 0.0–0.1)
Basophils Relative: 0 % (ref 0–1)
EOS ABS: 0.1 10*3/uL (ref 0.0–0.7)
EOS PCT: 1 % (ref 0–5)
HEMATOCRIT: 42.7 % (ref 36.0–46.0)
Hemoglobin: 13.7 g/dL (ref 12.0–15.0)
LYMPHS ABS: 1.8 10*3/uL (ref 0.7–4.0)
Lymphocytes Relative: 18 % (ref 12–46)
MCH: 27.7 pg (ref 26.0–34.0)
MCHC: 32.1 g/dL (ref 30.0–36.0)
MCV: 86.3 fL (ref 78.0–100.0)
Monocytes Absolute: 0.7 10*3/uL (ref 0.1–1.0)
Monocytes Relative: 7 % (ref 3–12)
Neutro Abs: 7.5 10*3/uL (ref 1.7–7.7)
Neutrophils Relative %: 74 % (ref 43–77)
Platelets: 317 10*3/uL (ref 150–400)
RBC: 4.95 MIL/uL (ref 3.87–5.11)
RDW: 15.4 % (ref 11.5–15.5)
WBC: 10.1 10*3/uL (ref 4.0–10.5)

## 2014-11-30 LAB — URINE MICROSCOPIC-ADD ON

## 2014-11-30 LAB — D-DIMER, QUANTITATIVE (NOT AT ARMC): D DIMER QUANT: 3.5 ug{FEU}/mL — AB (ref 0.00–0.48)

## 2014-11-30 LAB — I-STAT TROPONIN, ED: TROPONIN I, POC: 0 ng/mL (ref 0.00–0.08)

## 2014-11-30 LAB — LIPASE, BLOOD: Lipase: 23 U/L (ref 22–51)

## 2014-11-30 MED ORDER — MORPHINE SULFATE 2 MG/ML IJ SOLN
2.0000 mg | INTRAMUSCULAR | Status: AC | PRN
  Administered 2014-11-30 – 2014-12-01 (×2): 2 mg via INTRAVENOUS
  Filled 2014-11-30 (×2): qty 1

## 2014-11-30 MED ORDER — ACETAMINOPHEN 325 MG PO TABS
650.0000 mg | ORAL_TABLET | Freq: Four times a day (QID) | ORAL | Status: DC | PRN
Start: 2014-11-30 — End: 2014-12-04
  Filled 2014-11-30: qty 2

## 2014-11-30 MED ORDER — DIAZEPAM 5 MG/ML IJ SOLN
2.5000 mg | Freq: Once | INTRAMUSCULAR | Status: AC
  Administered 2014-11-30: 2.5 mg via INTRAVENOUS
  Filled 2014-11-30: qty 2

## 2014-11-30 MED ORDER — CLOPIDOGREL BISULFATE 75 MG PO TABS
75.0000 mg | ORAL_TABLET | Freq: Every day | ORAL | Status: DC
  Administered 2014-12-01 – 2014-12-04 (×4): 75 mg via ORAL
  Filled 2014-11-30 (×5): qty 1

## 2014-11-30 MED ORDER — ALUM & MAG HYDROXIDE-SIMETH 200-200-20 MG/5ML PO SUSP: 30.0000 mL | Freq: Four times a day (QID) | ORAL | Status: DC | PRN

## 2014-11-30 MED ORDER — IOHEXOL 350 MG/ML SOLN
100.0000 mL | Freq: Once | INTRAVENOUS | Status: AC | PRN
  Administered 2014-11-30: 100 mL via INTRAVENOUS

## 2014-11-30 MED ORDER — CIPROFLOXACIN IN D5W 400 MG/200ML IV SOLN
400.0000 mg | Freq: Two times a day (BID) | INTRAVENOUS | Status: DC
  Administered 2014-12-01 – 2014-12-02 (×4): 400 mg via INTRAVENOUS
  Filled 2014-11-30 (×6): qty 200

## 2014-11-30 MED ORDER — ATENOLOL 50 MG PO TABS: 50.0000 mg | ORAL_TABLET | Freq: Every day | ORAL | Status: DC

## 2014-11-30 MED ORDER — PRAVASTATIN SODIUM 20 MG PO TABS
20.0000 mg | ORAL_TABLET | Freq: Every day | ORAL | Status: DC
  Administered 2014-12-01 – 2014-12-02 (×2): 20 mg via ORAL
  Filled 2014-11-30 (×6): qty 1

## 2014-11-30 MED ORDER — MORPHINE SULFATE 2 MG/ML IJ SOLN
2.0000 mg | INTRAMUSCULAR | Status: AC | PRN
  Administered 2014-11-30 (×2): 2 mg via INTRAVENOUS
  Filled 2014-11-30 (×2): qty 1

## 2014-11-30 MED ORDER — HYDROMORPHONE HCL 1 MG/ML IJ SOLN
0.5000 mg | INTRAMUSCULAR | Status: DC | PRN
  Administered 2014-12-01 – 2014-12-03 (×9): 1 mg via INTRAVENOUS
  Filled 2014-11-30 (×11): qty 1

## 2014-11-30 MED ORDER — SODIUM CHLORIDE 0.9 % IJ SOLN
3.0000 mL | Freq: Two times a day (BID) | INTRAMUSCULAR | Status: DC
  Administered 2014-11-30 – 2014-12-01 (×2): 3 mL via INTRAVENOUS
  Administered 2014-12-01: 11:00:00 via INTRAVENOUS
  Administered 2014-12-02 – 2014-12-04 (×4): 3 mL via INTRAVENOUS

## 2014-11-30 MED ORDER — ACETAMINOPHEN 650 MG RE SUPP
650.0000 mg | Freq: Four times a day (QID) | RECTAL | Status: DC | PRN
Start: 2014-11-30 — End: 2014-12-04

## 2014-11-30 MED ORDER — ONDANSETRON HCL 4 MG PO TABS: 4.0000 mg | ORAL_TABLET | Freq: Four times a day (QID) | ORAL | Status: DC | PRN

## 2014-11-30 MED ORDER — ATENOLOL 25 MG PO TABS
75.0000 mg | ORAL_TABLET | Freq: Every day | ORAL | Status: DC
  Administered 2014-12-01 – 2014-12-04 (×4): 75 mg via ORAL
  Filled 2014-11-30 (×4): qty 3

## 2014-11-30 MED ORDER — ENOXAPARIN SODIUM 30 MG/0.3ML ~~LOC~~ SOLN
30.0000 mg | Freq: Every day | SUBCUTANEOUS | Status: DC
  Filled 2014-11-30 (×5): qty 0.3

## 2014-11-30 MED ORDER — SODIUM CHLORIDE 0.9 % IJ SOLN: 3.0000 mL | INTRAMUSCULAR | Status: DC | PRN

## 2014-11-30 MED ORDER — ONDANSETRON HCL 4 MG/2ML IJ SOLN: 4.0000 mg | Freq: Four times a day (QID) | INTRAMUSCULAR | Status: DC | PRN

## 2014-11-30 MED ORDER — OXYCODONE HCL 5 MG PO TABS
5.0000 mg | ORAL_TABLET | ORAL | Status: DC | PRN
  Administered 2014-12-01 – 2014-12-03 (×5): 5 mg via ORAL
  Filled 2014-11-30 (×7): qty 1

## 2014-11-30 MED ORDER — SODIUM CHLORIDE 0.9 % IV SOLN: 250.0000 mL | INTRAVENOUS | Status: DC | PRN

## 2014-11-30 NOTE — H&P (Signed)
[facility] Admission History and Physical       Margaret StadeRuth Brooks ZOX:[id number] DOB: 11/12/1921 DOA: 11/30/2014  Referring physician: EDP PCP: Rene PaciValerie Leschber, MD  Specialists:   Chief Complaint: Severe Back Pain  HPI: [name] is a 81 y.o. female with a history of CAD, HTN, Hyperlipidemia who presents to the ED with complaints of  10/10 constant Right upper Thoracic area Back pain x 3 days.  She reports that she twisted her back while trying to get up from her bed.   She reports having increased pain with moving and changing positions since her hip surgery.     She denies any falls or trauma.    She has been using her home pain Rx of Oxycodone without relief.    She has not been able to walk due to her increased pain.  Imaging studies have been negative for fractures or acute pathology.    A CTA of the Chest was performed due to an elevated D-Dimer and was negative for a Pulmonary Embolism.     She also has complaints of dysuria but denies any fever or chills.    She was found to have a UTI in the ED and a Urine culture was ordered and she was placed on IV Cipro .    Review of Systems:  Constitutional: No Weight Loss, No Weight Gain, Night Sweats, Fevers, Chills, Dizziness, Light Headedness, Fatigue, or Generalized Weakness HEENT: No Headaches, Difficulty Swallowing,Tooth/Dental Problems,Sore Throat,  No Sneezing, Rhinitis, Ear Ache, Nasal Congestion, or Post Nasal Drip,  Cardio-vascular:  No Chest pain, Orthopnea, PND, Edema in Lower Extremities, Anasarca, Dizziness, Palpitations  Resp: No Dyspnea, No DOE, No Productive Cough, No Non-Productive Cough, No Hemoptysis, No Wheezing.    GI: No Heartburn, Indigestion, Abdominal Pain, Nausea, Vomiting, Diarrhea, Constipation, Hematemesis, Hematochezia, Melena, Change in Bowel Habits,  Loss of Appetite  GU: No Dysuria, No Change in Color of Urine, +Urgency,+Urinary Frequency, +Flank pain.  Musculoskeletal: No Joint Pain or Swelling, No  Decreased Range of Motion, +Back Pain.  Neurologic: No Syncope, No Seizures, Muscle Weakness, Paresthesia, Vision Disturbance or Loss, No Diplopia, No Vertigo, No Difficulty Walking,  Skin: No Rash or Lesions. Psych: No Change in Mood or Affect, No Depression or Anxiety, No Memory loss, No Confusion, or Hallucinations   Past Medical History  Diagnosis Date  . ARTHRITIS   . 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 CORONARY ARTERY DISEASE   . MACULAR DEGENERATION   . PERIPHERAL VASCULAR DISEASE   . Sebaceous cyst     L chin and R neck  . Subclavian steal syndrome   . HYPERTENSION   . HYPERLIPIDEMIA   . Cyst of breast 2007    cyst on breast  . Osteoporosis, post-menopausal 07/2012    T10 compression fx, R hip fx 04/02/14 after fall     Past Surgical History  Procedure Laterality Date  . Tonsillectomy    . Sabacious cyst removed from breast  2011  . Breast surgery  2011    sebacious cyst removal   . Hip pinning Right 04/2014      Prior to Admission medications   Medication Sig Start Date End Date Taking? Authorizing Provider  atenolol (TENORMIN) 25 MG tablet Take 1 tablet (25 mg total) by mouth daily. Take with 50mg  tab for total 75mg  daily 07/10/14  Yes Newt LukesValerie A Leschber, MD  atenolol (TENORMIN) 50 MG tablet Take 1 tablet (50 mg total) by mouth daily. Take  with  tab for total  daily 07/10/14  Yes Newt Lukes, MD  clopidogrel (PLAVIX) 75 MG tablet TAKE 1 TABLET (75 MG TOTAL) BY MOUTH DAILY. Patient taking differently: Take 1 tablet by mouth daily. 11/21/14  Yes Newt Lukes, MD  oxyCODONE (OXY IR/ROXICODONE) 5 MG immediate release tablet Take 1 tablet (5 mg total) by mouth every 8 (eight) hours as needed for severe pain. 11/28/14  Yes Everlene Farrier, PA-C  pravastatin (PRAVACHOL) 20 MG tablet TAKE 1 TABLET (20 MG TOTAL) BY MOUTH DAILY. Patient taking differently: Take 20 mg by mouth daily. TAKE 1 TABLET (20 MG TOTAL) BY MOUTH DAILY. 03/03/14   Yes Newt Lukes, MD     Allergies  Allergen Reactions  . Codeine     Unknown  . Penicillins   . Sulfonamide Derivatives     Unknown.     Social History:  reports that she quit smoking about 5 years ago. She has never used smokeless tobacco. She reports that she drinks alcohol. She reports that she does not use illicit drugs.    Family History  Problem Relation Age of Onset  . Heart disease Other     Parent  . Miscarriages / Stillbirths Other   . Heart attack Father   . Cancer Mother   . Hyperlipidemia Brother   . Hypertension Brother   . Stroke Brother        Physical Exam:  GEN:  Pleasant Thin Elderly  81 y.o. Caucaisan female examined and in no acute distress; cooperative with exam Filed Vitals:   11/30/14 1548 11/30/14 1644 11/30/14 2029  BP: 181/84 161/75 185/91  Pulse: 69 69 83  Temp: 97.7 F (36.5 C)    TempSrc: Oral    Resp: 18  14  SpO2: 96%  92%   Blood pressure 185/91, pulse 83, temperature 97.7 F (36.5 C), temperature source Oral, resp. rate 14, SpO2 92 %. PSYCH: She is alert and oriented x4; does not appear anxious does not appear depressed; affect is normal HEENT: Normocephalic and Atraumatic, Mucous membranes pink; PERRLA; EOM intact; Fundi:  Benign;  No scleral icterus, Nares: Patent, Oropharynx: Clear, Poor Dentition,    Neck:  FROM, No Cervical Lymphadenopathy nor Thyromegaly or Carotid Bruit; No JVD; Breasts:: Not examined CHEST WALL: No tenderness CHEST: Normal respiration, clear to auscultation bilaterally HEART: Regular rate and rhythm; no murmurs rubs or gallops BACK: No kyphosis or scoliosis; No CVA tenderness ABDOMEN: Positive Bowel Sounds, Scaphoid, Soft Non-Tender, No Rebound or Guarding; No Masses, No Organomegaly  Rectal Exam: Not done EXTREMITIES: No  Cyanosis, Clubbing, or Edema; No Ulcerations. Genitalia: not examined PULSES: 2+ and symmetric SKIN: Normal hydration no rash or ulceration CNS:  Alert and Oriented x 4, No  Focal Deficits  Labs on Admission:  Basic Metabolic Panel:  Recent Labs Lab 11/30/14 1641  NA 140  K 4.2  CL 104  CO2 26  GLUCOSE 120*  BUN 30*  CREATININE 0.76  CALCIUM 9.4   Liver Function Tests:  Recent Labs Lab 11/30/14 1641  AST 17  ALT 11*  ALKPHOS 104  BILITOT 0.8  PROT 8.1  ALBUMIN 4.6    Recent Labs Lab 11/30/14 1641  LIPASE 23   No results for input(s): AMMONIA in the last 168 hours. CBC:  Recent Labs Lab 11/30/14 1641  WBC 10.1  NEUTROABS 7.5  HGB 13.7  HCT 42.7  MCV 86.3  PLT 317   Cardiac Enzymes: No results for input(s): CKTOTAL, CKMB, CKMBINDEX, TROPONINI  in the last 168 hours.  BNP (last 3 results) No results for input(s): BNP in the last 8760 hours.  ProBNP (last 3 results) No results for input(s): PROBNP in the last 8760 hours.  CBG: No results for input(s): GLUCAP in the last 168 hours.  Radiological Exams on Admission: Dg Ribs Unilateral W/chest Right  11/30/2014   CLINICAL DATA:  Chronic right upper back pain no trauma  EXAM: RIGHT RIBS AND CHEST - 3+ VIEW  COMPARISON:  Numerous prior studies including 07/23/2012  FINDINGS: Mild cardiac enlargement. Pronounced uncoiling of the aorta. Lungs are clear except for mild scarring or atelectasis at the bases. Bony thorax is intact.  IMPRESSION: No acute finding   Electronically Signed   By: Esperanza Heir M.D.   On: 11/30/2014 17:41   Ct Angio Chest Pe W/cm &/or Wo Cm  11/30/2014   CLINICAL DATA:  Right mid back pain. Concern pulmonary embolism. Urinary symptoms.  EXAM: CT ANGIOGRAPHY CHEST  CT ABDOMEN AND PELVIS WITH CONTRAST  TECHNIQUE: Multidetector CT imaging of the chest was performed using the standard protocol during bolus administration of intravenous contrast. Multiplanar CT image reconstructions and MIPs were obtained to evaluate the vascular anatomy. Multidetector CT imaging of the abdomen and pelvis was performed using the standard protocol during bolus administration of  intravenous contrast.  CONTRAST:  OMNIPAQUE IOHEXOL 350 MG/ML SOLN  COMPARISON:  CT 07/23/2012  FINDINGS: CTA CHEST FINDINGS  Mediastinum/Nodes: No filling defects within the pulmonary arteries to suggest acute pulmonary embolism. Acute findings aorta great vessels. There are intimal calcification of the aorta. There soft and calcified plaque within the aorta. No pericardial fluid. Esophagus is normal.  Lungs/Pleura: Centrilobular emphysema in the lung apices. No pulmonary edema. Mild linear scarring at the left lung base and right lung base.  Musculoskeletal: No aggressive osseous lesion.  CT ABDOMEN and PELVIS FINDINGS  Hepatobiliary: No biliary duct dilatation. Gallbladder is normal. No focal hepatic lesion. Common bile duct normal caliber.  Pancreas: Pancreas is normal. No ductal dilatation. No pancreatic inflammation.  Spleen: Normal spleen  Adrenals/urinary tract: There is a 23 mm lesion of the right adrenal gland which has washout characteristics characteristic of a benign adenoma. No hydronephrosis. No ureteral lithiasis. Normal bladder.  Stomach/Bowel: Stomach, small bowel, appendix, and cecum are normal. The colon and rectosigmoid colon are normal.  Vascular/Lymphatic: Abdominal aorta is normal caliber. There is no retroperitoneal or periportal lymphadenopathy. No pelvic lymphadenopathy. Heavy calcification of the aorta.  Reproductive: Post hysterectomy anatomy.  Musculoskeletal: No aggressive osseous lesion. There is a compression fractures of the T6 and T9 vertebral bodies which are progressed from comparison exam.  Review of the MIP images confirms the above findings.  IMPRESSION: Chest Impression:  1. No acute pulmonary embolism. 2. No pneumonia or pleural fluid. Abdomen / Pelvis Impression:  1. No Acute findings the abdomen pelvis. 2. Benign right adrenal adenoma. 3. Atherosclerotic calcification aorta. 4. Progressive compression fractures at T6 and T9 compared to CT of 07/23/2012.    Electronically Signed   By: Genevive Bi M.D.   On: 11/30/2014 19:10   Ct Abdomen Pelvis W Contrast  11/30/2014   CLINICAL DATA:  Right mid back pain. Concern pulmonary embolism. Urinary symptoms.  EXAM: CT ANGIOGRAPHY CHEST  CT ABDOMEN AND PELVIS WITH CONTRAST  TECHNIQUE: Multidetector CT imaging of the chest was performed using the standard protocol during bolus administration of intravenous contrast. Multiplanar CT image reconstructions and MIPs were obtained to evaluate the vascular anatomy. Multidetector CT imaging  of the abdomen and pelvis was performed using the standard protocol during bolus administration of intravenous contrast.  CONTRAST:  OMNIPAQUE IOHEXOL 350 MG/ML SOLN  COMPARISON:  CT 07/23/2012  FINDINGS: CTA CHEST FINDINGS  Mediastinum/Nodes: No filling defects within the pulmonary arteries to suggest acute pulmonary embolism. Acute findings aorta great vessels. There are intimal calcification of the aorta. There soft and calcified plaque within the aorta. No pericardial fluid. Esophagus is normal.  Lungs/Pleura: Centrilobular emphysema in the lung apices. No pulmonary edema. Mild linear scarring at the left lung base and right lung base.  Musculoskeletal: No aggressive osseous lesion.  CT ABDOMEN and PELVIS FINDINGS  Hepatobiliary: No biliary duct dilatation. Gallbladder is normal. No focal hepatic lesion. Common bile duct normal caliber.  Pancreas: Pancreas is normal. No ductal dilatation. No pancreatic inflammation.  Spleen: Normal spleen  Adrenals/urinary tract: There is a 23 mm lesion of the right adrenal gland which has washout characteristics characteristic of a benign adenoma. No hydronephrosis. No ureteral lithiasis. Normal bladder.  Stomach/Bowel: Stomach, small bowel, appendix, and cecum are normal. The colon and rectosigmoid colon are normal.  Vascular/Lymphatic: Abdominal aorta is normal caliber. There is no retroperitoneal or periportal lymphadenopathy. No pelvic  lymphadenopathy. Heavy calcification of the aorta.  Reproductive: Post hysterectomy anatomy.  Musculoskeletal: No aggressive osseous lesion. There is a compression fractures of the T6 and T9 vertebral bodies which are progressed from comparison exam.  Review of the MIP images confirms the above findings.  IMPRESSION: Chest Impression:  1. No acute pulmonary embolism. 2. No pneumonia or pleural fluid. Abdomen / Pelvis Impression:  1. No Acute findings the abdomen pelvis. 2. Benign right adrenal adenoma. 3. Atherosclerotic calcification aorta. 4. Progressive compression fractures at T6 and T9 compared to CT of 07/23/2012.   Electronically Signed   By: Genevive Bi M.D.   On: 11/30/2014 19:10      Assessment/Plan:   81 y.o. female with  Principal Problem:   1.    Intractable back pain   IV Valium PRN   PRN IV Dilaudid   Active Problems:   2.    UTI (lower urinary tract infection)   Urine C+S    IV Cipro     3.    Essential hypertension   Continue Atenolol   Monitor BPs     4.    DVT Prophylaxis   Lovenox         Code Status:     FULL CODE        Family Communication:   Daughter at Bedside    Disposition Plan:    Observation Status        Time spent:  76 Minutes      Ron Parker [facility] Pager [phone]   If 7AM -7PM Please Contact the Day Rounding Team MD for [facility]  If 7PM-7AM, Please Contact Night-Floor Coverage  [email or web address] Password [email or web address] 11/30/2014, 9:01 PM     ADDENDUM:   Patient was seen and examined on 11/30/2014

## 2014-11-30 NOTE — ED Provider Notes (Signed)
CSN: [id number]     Arrival date & time 11/30/14  1508 History   First MD Initiated Contact with Patient 11/30/14 1545     Chief Complaint  Patient presents with  . Back Pain   HPI Pt states she started having pain on Friday when she tried to get out of the bed on Friday.  She thinks she may have pulled muscle.   She was seen in the ED and was evaluated and released.  She has tried oxycodone without relief.  Right now she feels fine in the position that she is in.  She has been having pain on the right lower thoracic.  Sharp pain.  Increases with movement.  Somewhat better when leading to the left.  No cough.  No vomiting diarrhea.  Or fever.  No dysuria.   Past Medical History  Diagnosis Date  . ARTHRITIS   . 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 CORONARY ARTERY DISEASE   . MACULAR DEGENERATION   . PERIPHERAL VASCULAR DISEASE   . Sebaceous cyst     L chin and R neck  . Subclavian steal syndrome   . HYPERTENSION   . HYPERLIPIDEMIA   . Cyst of breast 2007    cyst on breast  . 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 Tonsillectomy    . Sabacious cyst removed from breast  2011  . Breast surgery  2011    sebacious cyst removal   . Hip pinning Right 04/2014   Family History  Problem Relation Age of Onset  . Heart disease Other     Parent  . Miscarriages / Stillbirths Other   . Heart attack Father   . Cancer Mother   . Hyperlipidemia Brother   . Hypertension Brother   . Stroke Brother    History  Substance Use Topics  . Smoking status: Former Smoker -- 1.00 packs/day for 61 years    Quit date: 09/29/2009  . Smokeless tobacco: Never Used     Comment: Widowed, lives in [facility]. Moved to [place] from [place] fall 2011 to be near dtr  . Alcohol Use: Yes     Comment: Occasional   OB History    No data available     Review of Systems  All other  systems reviewed and are negative.     Allergies  Codeine; Penicillins; and Sulfonamide derivatives  Home Medications   Prior to Admission medications   Medication Sig Start Date End Date Taking? Authorizing Provider  atenolol (TENORMIN) 25 MG tablet Take 1 tablet (25 mg total) by mouth daily. Take with  tab for total  daily 07/10/14  Yes Newt Lukes, MD  atenolol (TENORMIN) 50 MG tablet Take 1 tablet (50 mg total) by mouth daily. Take with  tab for total  daily 07/10/14  Yes Newt Lukes, MD  clopidogrel (PLAVIX) 75 MG tablet TAKE 1 TABLET (75 MG TOTAL) BY MOUTH DAILY. Patient taking differently: Take 1 tablet by mouth daily. 11/21/14  Yes Newt Lukes, MD  oxyCODONE (OXY IR/ROXICODONE) 5 MG immediate release tablet Take 1 tablet (5 mg total) by mouth every 8 (eight) hours as needed for severe pain. 11/28/14  Yes Everlene Farrier, PA-C  pravastatin (PRAVACHOL) 20 MG tablet TAKE 1 TABLET (20 MG TOTAL) BY MOUTH DAILY. Patient taking differently: Take 20 mg by mouth daily.  TAKE 1 TABLET (20 MG TOTAL) BY MOUTH DAILY. 03/03/14  Yes Newt LukesValerie A Leschber, MD   BP 161/75 mmHg  Pulse 69  Temp(Src) 97.7 F (36.5 C) (Oral)  Resp 18  SpO2 96% Physical Exam  Constitutional: She appears well-nourished. No distress.  Elderly, frail  HENT:  Head: Normocephalic and atraumatic.  Right Ear: External ear normal.  Left Ear: External ear normal.  Eyes: Conjunctivae are normal. Right eye exhibits no discharge. Left eye exhibits no discharge. No scleral icterus.  Neck: Neck supple. No tracheal deviation present.  Cardiovascular: Normal rate, regular rhythm and intact distal pulses.   Pulmonary/Chest: Effort normal and breath sounds normal. No stridor. No respiratory distress. She has no wheezes. She has no rales.   She exhibits tenderness.  Abdominal: Soft. Bowel sounds are normal. She exhibits no distension. There is tenderness in the right upper quadrant. There is CVA  tenderness. There is no rigidity, no rebound and no guarding. No hernia.  Musculoskeletal: She exhibits no edema or tenderness.  Neurological: She is alert. She has normal strength. No cranial nerve deficit (no facial droop, extraocular movements intact, no slurred speech) or sensory deficit. She exhibits normal muscle tone. She displays no seizure activity. Coordination normal.  Skin: Skin is warm and dry. No rash noted.  Psychiatric: She has a normal mood and affect.  Nursing note and vitals reviewed.  After rolling over on her left side  the patient began having severe pain. She is groaning and crying out that she can not take the pain ED Course  Procedures (including critical care time) Labs Review Labs Reviewed  COMPREHENSIVE METABOLIC PANEL - Abnormal; Notable for the following:    Glucose, Bld 120 (*)    BUN 30 (*)    ALT 11 (*)    All other components within normal limits  URINALYSIS, ROUTINE W REFLEX MICROSCOPIC - Abnormal; Notable for the following:    APPearance CLOUDY (*)    Hgb urine dipstick TRACE (*)    Ketones, ur 15 (*)    Leukocytes, UA SMALL (*)    All other components within normal limits  D-DIMER, QUANTITATIVE - Abnormal; Notable for the following:    D-Dimer, Quant 3.50 (*)    All other components within normal limits  CBC WITH DIFFERENTIAL/PLATELET  LIPASE, BLOOD  URINE MICROSCOPIC-ADD ON  I-STAT TROPOININ, ED    Imaging Review Dg Ribs Unilateral W/chest Right  11/30/2014   CLINICAL DATA:  Chronic right upper back pain no trauma  EXAM: RIGHT RIBS AND CHEST - 3+ VIEW  COMPARISON:  Numerous prior studies including 07/23/2012  FINDINGS: Mild cardiac enlargement. Pronounced uncoiling of the aorta. Lungs are clear except for mild scarring or atelectasis at the bases. Bony thorax is intact.  IMPRESSION: No acute finding   Electronically Signed   By: Esperanza Heiraymond  Rubner M.D.   On: 11/30/2014 17:41   Ct Angio Chest Pe W/cm &/or Wo Cm  11/30/2014   CLINICAL DATA:  Right  mid back pain. Concern pulmonary embolism. Urinary symptoms.  EXAM: CT ANGIOGRAPHY CHEST  CT ABDOMEN AND PELVIS WITH CONTRAST  TECHNIQUE: Multidetector CT imaging of the chest was performed using the standard protocol during bolus administration of intravenous contrast. Multiplanar CT image reconstructions and MIPs were obtained to evaluate the vascular anatomy. Multidetector CT imaging of the abdomen and pelvis was performed using the standard protocol during bolus administration of intravenous contrast.  CONTRAST:  100mL OMNIPAQUE IOHEXOL 350 MG/ML SOLN  COMPARISON:  CT 07/23/2012  FINDINGS: CTA  CHEST FINDINGS  Mediastinum/Nodes: No filling defects within the pulmonary arteries to suggest acute pulmonary embolism. Acute findings aorta great vessels. There are intimal calcification of the aorta. There soft and calcified plaque within the aorta. No pericardial fluid. Esophagus is normal.  Lungs/Pleura: Centrilobular emphysema in the lung apices. No pulmonary edema. Mild linear scarring at the left lung base and right lung base.  Musculoskeletal: No aggressive osseous lesion.  CT ABDOMEN and PELVIS FINDINGS  Hepatobiliary: No biliary duct dilatation. Gallbladder is normal. No focal hepatic lesion. Common bile duct normal caliber.  Pancreas: Pancreas is normal. No ductal dilatation. No pancreatic inflammation.  Spleen: Normal spleen  Adrenals/urinary tract: There is a 23 mm lesion of the right adrenal gland which has washout characteristics characteristic of a benign adenoma. No hydronephrosis. No ureteral lithiasis. Normal bladder.  Stomach/Bowel: Stomach, small bowel, appendix, and cecum are normal. The colon and rectosigmoid colon are normal.  Vascular/Lymphatic: Abdominal aorta is normal caliber. There is no retroperitoneal or periportal lymphadenopathy. No pelvic lymphadenopathy. Heavy calcification of the aorta.  Reproductive: Post hysterectomy anatomy.  Musculoskeletal: No aggressive osseous lesion. There is a  compression fractures of the T6 and T9 vertebral bodies which are progressed from comparison exam.  Review of the MIP images confirms the above findings.  IMPRESSION: Chest Impression:  1. No acute pulmonary embolism. 2. No pneumonia or pleural fluid. Abdomen / Pelvis Impression:  1. No Acute findings the abdomen pelvis. 2. Benign right adrenal adenoma. 3. Atherosclerotic calcification aorta. 4. Progressive compression fractures at T6 and T9 compared to CT of 07/23/2012.   Electronically Signed   By: Genevive Bi M.D.   On: 11/30/2014 19:10   Ct Abdomen Pelvis W Contrast  11/30/2014   CLINICAL DATA:  Right mid back pain. Concern pulmonary embolism. Urinary symptoms.  EXAM: CT ANGIOGRAPHY CHEST  CT ABDOMEN AND PELVIS WITH CONTRAST  TECHNIQUE: Multidetector CT imaging of the chest was performed using the standard protocol during bolus administration of intravenous contrast. Multiplanar CT image reconstructions and MIPs were obtained to evaluate the vascular anatomy. Multidetector CT imaging of the abdomen and pelvis was performed using the standard protocol during bolus administration of intravenous contrast.  CONTRAST:  OMNIPAQUE IOHEXOL 350 MG/ML SOLN  COMPARISON:  CT 07/23/2012  FINDINGS: CTA CHEST FINDINGS  Mediastinum/Nodes: No filling defects within the pulmonary arteries to suggest acute pulmonary embolism. Acute findings aorta great vessels. There are intimal calcification of the aorta. There soft and calcified plaque within the aorta. No pericardial fluid. Esophagus is normal.  Lungs/Pleura: Centrilobular emphysema in the lung apices. No pulmonary edema. Mild linear scarring at the left lung base and right lung base.  Musculoskeletal: No aggressive osseous lesion.  CT ABDOMEN and PELVIS FINDINGS  Hepatobiliary: No biliary duct dilatation. Gallbladder is normal. No focal hepatic lesion. Common bile duct normal caliber.  Pancreas: Pancreas is normal. No ductal dilatation. No pancreatic inflammation.   Spleen: Normal spleen  Adrenals/urinary tract: There is a 23 mm lesion of the right adrenal gland which has washout characteristics characteristic of a benign adenoma. No hydronephrosis. No ureteral lithiasis. Normal bladder.  Stomach/Bowel: Stomach, small bowel, appendix, and cecum are normal. The colon and rectosigmoid colon are normal.  Vascular/Lymphatic: Abdominal aorta is normal caliber. There is no retroperitoneal or periportal lymphadenopathy. No pelvic lymphadenopathy. Heavy calcification of the aorta.  Reproductive: Post hysterectomy anatomy.  Musculoskeletal: No aggressive osseous lesion. There is a compression fractures of the T6 and T9 vertebral bodies which are progressed from comparison exam.  Review of the MIP images confirms the above findings.  IMPRESSION: Chest Impression:  1. No acute pulmonary embolism. 2. No pneumonia or pleural fluid. Abdomen / Pelvis Impression:  1. No Acute findings the abdomen pelvis. 2. Benign right adrenal adenoma. 3. Atherosclerotic calcification aorta. 4. Progressive compression fractures at T6 and T9 compared to CT of 07/23/2012.   Electronically Signed   By: Genevive Bi M.D.   On: 11/30/2014 19:10     EKG Interpretation   Date/Time:  Sunday Nov 30 2014 16:26:13 EDT Ventricular Rate:  75 PR Interval:  183 QRS Duration: 79 QT Interval:  376 QTC Calculation: 420 R Axis:   -28 Text Interpretation:  Sinus rhythm Atrial premature complex Borderline  left axis deviation Anteroseptal infarct, age indeterminate Baseline  wander in lead(s) V6 No significant change since last tracing Confirmed by  Brienne Liguori  MD-J, Valita Righter ([id number]) on 11/30/2014 4:46:49 PM     Medications  morphine 2 MG/ML injection 2 mg (not administered)  morphine 2 MG/ML injection 2 mg (2 mg Intravenous Given 11/30/14 1734)  iohexol (OMNIPAQUE) 350 MG/ML injection 100 mL (100 mLs Intravenous Contrast Given 11/30/14 1825)    MDM   Final diagnoses:  Abdominal pain, acute  Chest pain  UTI  (lower urinary tract infection)   Patient CT scan does not show any evidence of acute pathology in the chest or abdominal area. She does not have a PE or any signs of acute surgical condition. Patient's urinalysis does suggest a urinary tract infection. I doubt that all of her flank pain however is related to the UTI. She is having urinary frequency and has been constantly asking to use the bedpan. This is related to her urinary tract infection but a think her back pain may be more musculoskeletal. Regardless patient is unable to get up out of the bed. I will consult the medical service for further treatment.   Linwood Dibbles, MD 11/30/14 [phone]

## 2014-11-30 NOTE — ED Notes (Signed)
Pt complains of right mid back pain. Pt has hx of chronic back pain, was seen on Friday for back pain. Pt was given prescription for oxycodone, which she states has not helped to her satisfaction. Pt denies urinary symptoms, fevers.

## 2014-11-30 NOTE — ED Notes (Addendum)
When asked questions pt is extremely rude and disrespectful to staff. Pt sts " Oh God, I'm tired of these questions." Pt was advised that EDP wants an I&O cath. Pt refused and sts that "Dr's just order things. That's what they do." Pt will not answer whether pain med has relieved her pain. Pt was asked if she wanted to be repositioned for comfort. Pt refused sts, "I want you to stop touching me!". Pt asked for bedpan. This Clinical research associatewriter and tech was attempting to provide bedpan, pt sts," I don't think I can go now." Pt continues to be uncooperative and unreasonably rude to all staff that are offering assistance,.

## 2014-12-01 ENCOUNTER — Encounter (HOSPITAL_COMMUNITY): Payer: Self-pay

## 2014-12-01 DIAGNOSIS — S22000A Wedge compression fracture of unspecified thoracic vertebra, initial encounter for closed fracture: Secondary | ICD-10-CM | POA: Diagnosis not present

## 2014-12-01 DIAGNOSIS — N390 Urinary tract infection, site not specified: Secondary | ICD-10-CM | POA: Diagnosis present

## 2014-12-01 DIAGNOSIS — E785 Hyperlipidemia, unspecified: Secondary | ICD-10-CM | POA: Diagnosis present

## 2014-12-01 DIAGNOSIS — I10 Essential (primary) hypertension: Secondary | ICD-10-CM | POA: Diagnosis present

## 2014-12-01 DIAGNOSIS — Z880 Allergy status to penicillin: Secondary | ICD-10-CM | POA: Diagnosis not present

## 2014-12-01 DIAGNOSIS — M4854XA Collapsed vertebra, not elsewhere classified, thoracic region, initial encounter for fracture: Secondary | ICD-10-CM | POA: Diagnosis present

## 2014-12-01 DIAGNOSIS — M549 Dorsalgia, unspecified: Secondary | ICD-10-CM | POA: Diagnosis present

## 2014-12-01 DIAGNOSIS — Z7902 Long term (current) use of antithrombotics/antiplatelets: Secondary | ICD-10-CM | POA: Diagnosis not present

## 2014-12-01 DIAGNOSIS — Z882 Allergy status to sulfonamides status: Secondary | ICD-10-CM | POA: Diagnosis not present

## 2014-12-01 DIAGNOSIS — M1990 Unspecified osteoarthritis, unspecified site: Secondary | ICD-10-CM | POA: Diagnosis present

## 2014-12-01 DIAGNOSIS — Z886 Allergy status to analgesic agent status: Secondary | ICD-10-CM | POA: Diagnosis not present

## 2014-12-01 DIAGNOSIS — I739 Peripheral vascular disease, unspecified: Secondary | ICD-10-CM | POA: Diagnosis present

## 2014-12-01 DIAGNOSIS — I2510 Atherosclerotic heart disease of native coronary artery without angina pectoris: Secondary | ICD-10-CM | POA: Diagnosis present

## 2014-12-01 DIAGNOSIS — E876 Hypokalemia: Secondary | ICD-10-CM | POA: Diagnosis present

## 2014-12-01 DIAGNOSIS — Z87891 Personal history of nicotine dependence: Secondary | ICD-10-CM | POA: Diagnosis not present

## 2014-12-01 LAB — BASIC METABOLIC PANEL
Anion gap: 8 (ref 5–15)
BUN: 26 mg/dL — AB (ref 6–20)
CO2: 27 mmol/L (ref 22–32)
Calcium: 9.1 mg/dL (ref 8.9–10.3)
Chloride: 106 mmol/L (ref 101–111)
Creatinine, Ser: 0.66 mg/dL (ref 0.44–1.00)
Glucose, Bld: 114 mg/dL — ABNORMAL HIGH (ref 70–99)
Potassium: 3.4 mmol/L — ABNORMAL LOW (ref 3.5–5.1)
SODIUM: 141 mmol/L (ref 135–145)

## 2014-12-01 LAB — CBC
HEMATOCRIT: 37.5 % (ref 36.0–46.0)
Hemoglobin: 11.8 g/dL — ABNORMAL LOW (ref 12.0–15.0)
MCH: 26.9 pg (ref 26.0–34.0)
MCHC: 31.5 g/dL (ref 30.0–36.0)
MCV: 85.4 fL (ref 78.0–100.0)
Platelets: 300 10*3/uL (ref 150–400)
RBC: 4.39 MIL/uL (ref 3.87–5.11)
RDW: 15.3 % (ref 11.5–15.5)
WBC: 9.1 10*3/uL (ref 4.0–10.5)

## 2014-12-01 NOTE — Progress Notes (Signed)
   12/01/14 1350  PT Time Calculation  PT Start Time (ACUTE ONLY) 1158  PT Stop Time (ACUTE ONLY) 1216  PT Time Calculation (min) (ACUTE ONLY) 18 min  PT G-Codes **NOT FOR INPATIENT CLASS**  Functional Assessment Tool Used (clinical judgement)  Functional Limitation Mobility: Walking and moving around  Mobility: Walking and Moving Around Current Status (A2130(G8978) CJ  Mobility: Walking and Moving Around Goal Status (Q6578(G8979) CI  PT General Charges  $$ ACUTE PT VISIT 1 Procedure  PT Evaluation  $Initial PT Evaluation Tier I 1 Procedure   Rebeca Alertjannie Analya Louissaint, MPT [phone]

## 2014-12-01 NOTE — Clinical Social Work Placement (Signed)
   CLINICAL SOCIAL WORK PLACEMENT  NOTE  Date:  12/01/2014  Patient Details  Name: [name] MRN: [id number] Date of Birth: 06/21/1922  Clinical Social Work is seeking post-discharge placement for this patient at the Skilled  Nursing Facility level of care (*CSW will initial, date and re-position this form in  chart as items are completed):  Yes   Patient/family provided with [facility] Clinical Social Work Department's list of facilities offering this level of care within the geographic area requested by the patient (or if unable, by the patient's family).  Yes   Patient/family informed of their freedom to choose among providers that offer the needed level of care, that participate in Medicare, Medicaid or managed care program needed by the patient, have an available bed and are willing to accept the patient.  Yes   Patient/family informed of [facility] ownership interest [facility] and [facility], as well as of the fact that they are under no obligation to receive care at these facilities.  PASRR submitted to EDS on       PASRR number received on       Existing PASRR number confirmed on 12/01/14     FL2 transmitted to all facilities in geographic area requested by pt/family on 12/01/14     FL2 transmitted to all facilities within larger geographic area on       Patient informed that his/her managed care company has contracts with or will negotiate with certain facilities, including the following:            Patient/family informed of bed offers received.  Patient chooses bed at       Physician recommends and patient chooses bed at      Patient to be transferred to   on  .  Patient to be transferred to facility by       Patient family notified on   of transfer.  Name of family member notified:        PHYSICIAN       Additional Comment:    _______________________________________________ Orson EvaKIDD, SUZANNA A, LCSW 12/01/2014, 5:07 PM  [phone]

## 2014-12-01 NOTE — Evaluation (Signed)
Physical Therapy Evaluation Patient Details Name: Wendall StadeRuth Knittel MRN: [id number] DOB: 07/17/1922 Today's Date: 12/01/2014   History of Present Illness  81 yo female admitted with back pain, progressive compression fractures T6, T9. Hx of CAD, HTN. Pt is from ALF  Clinical Impression  On eval, pt required Min assist for mobility-able to ambulate ~10 feet with RW. Mod encouragement for participation, progression of activity. Pt refused to ambulate in hallway. Recommend ST rehab at SNF unless ALF can provide current level of assist.     Follow Up Recommendations SNF (unless facility feels they can provide current level of assist)    Equipment Recommendations  Rolling walker with 5" wheels (if pt does not have one)    Recommendations for Other Services OT consult     Precautions / Restrictions Precautions Precautions: Fall Precaution Comments: logroll/back precautions for comfort Restrictions Weight Bearing Restrictions: No      Mobility  Bed Mobility Overal bed mobility: Needs Assistance Bed Mobility: Supine to Sit;Sit to Sidelying     Supine to sit: Min assist   Sit to sidelying: Min assist General bed mobility comments: Increased time. VC safety, technique.   Transfers Overall transfer level: Needs assistance Equipment used: Rolling walker (2 wheeled) Transfers: Sit to/from Stand Sit to Stand: Min assist;From elevated surface         General transfer comment: Assist to rise, stabilize, control descent. VCs safety, technique, hand placement.   Ambulation/Gait Ambulation/Gait assistance: Min assist Ambulation Distance (Feet): 10 Feet Assistive device: Rolling walker (2 wheeled) Gait Pattern/deviations: Trunk flexed;Decreased stride length;Step-through pattern     General Gait Details: Assist to stabilize and maneuver with RW. Limited by pain. Pt refused to ambulate in hallway  Stairs            Wheelchair Mobility    Modified Rankin (Stroke Patients  Only)       Balance                                             Pertinent Vitals/Pain Pain Assessment: Faces Faces Pain Scale: Hurts whole lot Pain Location: mid to upper R side of back Pain Descriptors / Indicators: Sharp Pain Intervention(s): Monitored during session;Limited activity within patient's tolerance;Repositioned    Home Living Family/patient expects to be discharged to:: Unsure Living Arrangements: Alone   Type of Home: Assisted living Home Access: Level entry     Home Layout: One level Home Equipment: Environmental consultantWalker - 2 wheels      Prior Function Level of Independence: Independent with assistive device(s)               Hand Dominance        Extremity/Trunk Assessment   Upper Extremity Assessment: Generalized weakness           Lower Extremity Assessment: Generalized weakness      Cervical / Trunk Assessment: Kyphotic  Communication   Communication: No difficulties  Cognition Arousal/Alertness: Awake/alert Behavior During Therapy: WFL for tasks assessed/performed Overall Cognitive Status: Within Functional Limits for tasks assessed                      General Comments      Exercises        Assessment/Plan    PT Assessment Patient needs continued PT services  PT Diagnosis Difficulty walking;Generalized weakness;Acute pain   PT Problem List Decreased strength;Decreased  activity tolerance;Decreased balance;Decreased mobility;Decreased knowledge of use of DME;Pain  PT Treatment Interventions DME instruction;Gait training;Functional mobility training;Therapeutic activities;Therapeutic exercise;Patient/family education;Balance training   PT Goals (Current goals can be found in the Care Plan section) Acute Rehab PT Goals Patient Stated Goal: less pain PT Goal Formulation: With patient Time For Goal Achievement: 12/15/14 Potential to Achieve Goals: Fair    Frequency Min 3X/week   Barriers to discharge         Co-evaluation               End of Session   Activity Tolerance: Patient limited by pain Patient left: in bed;with call bell/phone within reach           Time: 1158-1216 PT Time Calculation (min) (ACUTE ONLY): 18 min   Charges:   PT Evaluation $Initial PT Evaluation Tier I: 1 Procedure     PT G Codes:        Rebeca Alert, MPT Pager: [phone]

## 2014-12-01 NOTE — Progress Notes (Signed)
UR completed 

## 2014-12-01 NOTE — Clinical Social Work Note (Signed)
Clinical Social Work Assessment  Patient Details  Name: [name] MRN: [id number] Date of Birth: Jun 17, 1922  Date of referral:  12/01/14               Reason for consult:  Facility Placement, Discharge Planning                Permission sought to share information with:  Facility Sport and exercise psychologist, Family Supports Permission granted to share information::  Yes, Verbal Permission Granted  Name::     Diane Industrial/product designer  Agency::  [facility] SNF search  Relationship::  daughter; SNF facilities in [place]:  [phone]; daughter  Housing/Transportation Living arrangements for the past 2 months:  [place] of Information:  Adult Children, Patient Patient Interpreter Needed:  None Criminal Activity/Legal Involvement Pertinent to Current Situation/Hospitalization:  No - Comment as needed Significant Relationships:  Adult Children Lives with:  Self Do you feel safe going back to the place where you live?  No Need for family participation in patient care:  Yes (Comment)  Care giving concerns:  Pt from independent living facility; [place] and only has assistance with ambulating to the dining room. Pt weaker secondary to intractable back pain and UTI.    Social Worker assessment / plan:  CSW received referral that pt admitted from [facility], but PT evaluation recommending short term rehab at [facility]   CSW visited pt room. Pt alone during initial visit. CSW introduced self and explained role. Pt confirmed that she is a resident at [facility]. Pt reports that she lives alone in [place] apartment and has assistance with ambulating to the dining room. CSW discussed PT recommendation for short term rehab and pt states, "I don't want to talk about that now; I'm in pain". CSW notified RN in order for RN to assess pt pain. Pt shared that pt daughter was on the way to hospital and  pt preferred for CSW to return when pt daughter arrived.   CSW received notification that pt daughter was at bedside. CSW met with pt and pt daughter at bedside. CSW introduced self to pt daughter and explained role. CSW discussed with pt and pt daughter recommendation for short term rehab at [facility] given pt weakness and pain. CSW clarified pt questions. Pt daughter reports that pt has been to rehab in the past, so pt and pt daughter are familiar with rehab. CSW discussed process of SNF search. Pt and pt daughter agreeable to [facility] search. Pt and pt daughter expressed interest in [facility] and [facility] and [facility]. CSW discussed that pt insurance requires insurance authorization for SNF.   CSW completed FL2 and initiated SNF search to [facility].   CSW scheduled time with pt and pt daughter tomorrow morning to meet at 10 am in order to discuss SNF bed offers.   CSW to continue to follow to provide support and assist with pt disposition needs.   Employment status:  Retired Nurse, adult PT Recommendations:  [place] / Referral to community resources:  [place]  Patient/Family's Response to care:  Pt alert and oriented x 4. Pt more responsive to CSW visit when pt daughter present. Pt recognizes need to explore short term rehab options and pt daughter agrees that this will be best plan upon discharge.   Patient/Family's Understanding of and Emotional Response to Diagnosis, Current Treatment, and Prognosis:  Pt  understanding of diagnosis and hopeful that her pain will become more managed. Pt coping appropriately with current treatment for intractable back pain and UTI.   Emotional Assessment Appearance:  Appears stated age Attitude/Demeanor/Rapport:  Complaining, Other (pt expressing pain during assessment-RN notified) Affect (typically observed):  Anxious Orientation:  Oriented to Self,  Oriented to Place, Oriented to  Time Alcohol / Substance use:  Not Applicable Psych involvement (Current and /or in the community):  No (Comment)  Discharge Needs  Concerns to be addressed:  Discharge Planning Concerns Readmission within the last 30 days:  No Current discharge risk:  Lives alone Barriers to Discharge:  Continued Medical Work up   Ladell Pier, [place] 12/01/2014, 4:52 PM  [phone]

## 2014-12-01 NOTE — Progress Notes (Signed)
Patient arrived on the unit at approximately 2127 last night. Patient is alert and verbally responsive. Refused medications last night for various reasons such as the dose is too high, duplicate orders and needing to speak with the doctor first. Complained of back discomfort with activity. Medicated times one with Morphine with good effect.

## 2014-12-01 NOTE — Progress Notes (Signed)
[facility] PROGRESS NOTE   [name] ZOX:[id number] DOB: May 14, 1922 DOA: 11/30/2014 PCP: Rene Paci, MD  HPI/Subjective: Feels okay, but is complaining about back pain, placed on IV narcotics. DG/OT to evaluate and treat.   Assessment/Plan: Principal Problem:   Intractable back pain Active Problems:   Essential hypertension   UTI (lower urinary tract infection)   Intractable back pain Intractable back pain, CT of the chest/pelvis ruled out acute events. Patient has progressive fractures at T6 and T9 compared to previous studies, likely causing the pain. Control pain with narcotics and benzodiazepines. patient evaluated by PT and recommended SNF.  UTI Patient started on Rocephin, continued. Adjust antibiotics according to culture results.  Essential hypertension Continue home medications, appears to be controlled.  Code Status: Full Code Family Communication: Plan discussed with the patient. Disposition Plan: Remains inpatient Diet:    Consultants:  None  Procedures:   none  Antibiotics:  Cipro    Objective: Filed Vitals:   12/01/14 0505  BP: 95/73  Pulse: 57  Temp: 98.4 F (36.9 C)  Resp: 16    Intake/Output Summary (Last 24 hours) at 12/01/14 1401 Last data filed at 12/01/14 0123  Gross per 24 hour  Intake      0 ml  Output    200 ml  Net   -200 ml   Filed Weights   11/30/14 2140 11/30/14 2243  Weight: 20.4 kg (44 lb 15.6 oz) 50.4 kg (111 lb 1.8 oz)    Exam: General: Alert and awake, oriented x3, not in any acute distress. HEENT: anicteric sclera, pupils reactive to light and accommodation, EOMI CVS: S1-S2 clear, no murmur rubs or gallops Chest: clear to auscultation bilaterally, no wheezing, rales or rhonchi Abdomen: soft nontender, nondistended, normal bowel sounds, no organomegaly Extremities: no cyanosis, clubbing or edema noted bilaterally Neuro: Cranial nerves II-XII intact, no focal neurological deficits  Data  Reviewed: Basic Metabolic Panel:  Recent Labs Lab 11/30/14 1641 12/01/14 0420  NA 140 141  K 4.2 3.4*  CL 104 106  CO2 26 27  GLUCOSE 120* 114*  BUN 30* 26*  CREATININE 0.76 0.66  CALCIUM 9.4 9.1   Liver Function Tests:  Recent Labs Lab 11/30/14 1641  AST 17  ALT 11*  ALKPHOS 104  BILITOT 0.8  PROT 8.1  ALBUMIN 4.6    Recent Labs Lab 11/30/14 1641  LIPASE 23   No results for input(s): AMMONIA in the last 168 hours. CBC:  Recent Labs Lab 11/30/14 1641 12/01/14 0420  WBC 10.1 9.1  NEUTROABS 7.5  --   HGB 13.7 11.8*  HCT 42.7 37.5  MCV 86.3 85.4  PLT 317 300   Cardiac Enzymes: No results for input(s): CKTOTAL, CKMB, CKMBINDEX, TROPONINI in the last 168 hours. BNP (last 3 results) No results for input(s): BNP in the last 8760 hours.  ProBNP (last 3 results) No results for input(s): PROBNP in the last 8760 hours.  CBG: No results for input(s): GLUCAP in the last 168 hours.  Micro No results found for this or any previous visit (from the past 240 hour(s)).   Studies: Dg Ribs Unilateral W/chest Right  11/30/2014   CLINICAL DATA:  Chronic right upper back pain no trauma  EXAM: RIGHT RIBS AND CHEST - 3+ VIEW  COMPARISON:  Numerous prior studies including 07/23/2012  FINDINGS: Mild cardiac enlargement. Pronounced uncoiling of the aorta. Lungs are clear except for mild scarring or atelectasis at the bases. Bony thorax is intact.  IMPRESSION: No acute finding  Electronically Signed   By: Esperanza Heir M.D.   On: 11/30/2014 17:41   Ct Angio Chest Pe W/cm &/or Wo Cm  11/30/2014   CLINICAL DATA:  Right mid back pain. Concern pulmonary embolism. Urinary symptoms.  EXAM: CT ANGIOGRAPHY CHEST  CT ABDOMEN AND PELVIS WITH CONTRAST  TECHNIQUE: Multidetector CT imaging of the chest was performed using the standard protocol during bolus administration of intravenous contrast. Multiplanar CT image reconstructions and MIPs were obtained to evaluate the vascular anatomy.  Multidetector CT imaging of the abdomen and pelvis was performed using the standard protocol during bolus administration of intravenous contrast.  CONTRAST:  OMNIPAQUE IOHEXOL 350 MG/ML SOLN  COMPARISON:  CT 07/23/2012  FINDINGS: CTA CHEST FINDINGS  Mediastinum/Nodes: No filling defects within the pulmonary arteries to suggest acute pulmonary embolism. Acute findings aorta great vessels. There are intimal calcification of the aorta. There soft and calcified plaque within the aorta. No pericardial fluid. Esophagus is normal.  Lungs/Pleura: Centrilobular emphysema in the lung apices. No pulmonary edema. Mild linear scarring at the left lung base and right lung base.  Musculoskeletal: No aggressive osseous lesion.  CT ABDOMEN and PELVIS FINDINGS  Hepatobiliary: No biliary duct dilatation. Gallbladder is normal. No focal hepatic lesion. Common bile duct normal caliber.  Pancreas: Pancreas is normal. No ductal dilatation. No pancreatic inflammation.  Spleen: Normal spleen  Adrenals/urinary tract: There is a 23 mm lesion of the right adrenal gland which has washout characteristics characteristic of a benign adenoma. No hydronephrosis. No ureteral lithiasis. Normal bladder.  Stomach/Bowel: Stomach, small bowel, appendix, and cecum are normal. The colon and rectosigmoid colon are normal.  Vascular/Lymphatic: Abdominal aorta is normal caliber. There is no retroperitoneal or periportal lymphadenopathy. No pelvic lymphadenopathy. Heavy calcification of the aorta.  Reproductive: Post hysterectomy anatomy.  Musculoskeletal: No aggressive osseous lesion. There is a compression fractures of the T6 and T9 vertebral bodies which are progressed from comparison exam.  Review of the MIP images confirms the above findings.  IMPRESSION: Chest Impression:  1. No acute pulmonary embolism. 2. No pneumonia or pleural fluid. Abdomen / Pelvis Impression:  1. No Acute findings the abdomen pelvis. 2. Benign right adrenal adenoma. 3.  Atherosclerotic calcification aorta. 4. Progressive compression fractures at T6 and T9 compared to CT of 07/23/2012.   Electronically Signed   By: Genevive Bi M.D.   On: 11/30/2014 19:10   Ct Abdomen Pelvis W Contrast  11/30/2014   CLINICAL DATA:  Right mid back pain. Concern pulmonary embolism. Urinary symptoms.  EXAM: CT ANGIOGRAPHY CHEST  CT ABDOMEN AND PELVIS WITH CONTRAST  TECHNIQUE: Multidetector CT imaging of the chest was performed using the standard protocol during bolus administration of intravenous contrast. Multiplanar CT image reconstructions and MIPs were obtained to evaluate the vascular anatomy. Multidetector CT imaging of the abdomen and pelvis was performed using the standard protocol during bolus administration of intravenous contrast.  CONTRAST:  OMNIPAQUE IOHEXOL 350 MG/ML SOLN  COMPARISON:  CT 07/23/2012  FINDINGS: CTA CHEST FINDINGS  Mediastinum/Nodes: No filling defects within the pulmonary arteries to suggest acute pulmonary embolism. Acute findings aorta great vessels. There are intimal calcification of the aorta. There soft and calcified plaque within the aorta. No pericardial fluid. Esophagus is normal.  Lungs/Pleura: Centrilobular emphysema in the lung apices. No pulmonary edema. Mild linear scarring at the left lung base and right lung base.  Musculoskeletal: No aggressive osseous lesion.  CT ABDOMEN and PELVIS FINDINGS  Hepatobiliary: No biliary duct dilatation. Gallbladder is normal. No  focal hepatic lesion. Common bile duct normal caliber.  Pancreas: Pancreas is normal. No ductal dilatation. No pancreatic inflammation.  Spleen: Normal spleen  Adrenals/urinary tract: There is a 23 mm lesion of the right adrenal gland which has washout characteristics characteristic of a benign adenoma. No hydronephrosis. No ureteral lithiasis. Normal bladder.  Stomach/Bowel: Stomach, small bowel, appendix, and cecum are normal. The colon and rectosigmoid colon are normal.   Vascular/Lymphatic: Abdominal aorta is normal caliber. There is no retroperitoneal or periportal lymphadenopathy. No pelvic lymphadenopathy. Heavy calcification of the aorta.  Reproductive: Post hysterectomy anatomy.  Musculoskeletal: No aggressive osseous lesion. There is a compression fractures of the T6 and T9 vertebral bodies which are progressed from comparison exam.  Review of the MIP images confirms the above findings.  IMPRESSION: Chest Impression:  1. No acute pulmonary embolism. 2. No pneumonia or pleural fluid. Abdomen / Pelvis Impression:  1. No Acute findings the abdomen pelvis. 2. Benign right adrenal adenoma. 3. Atherosclerotic calcification aorta. 4. Progressive compression fractures at T6 and T9 compared to CT of 07/23/2012.   Electronically Signed   By: Genevive BiStewart  Edmunds M.D.   On: 11/30/2014 19:10    Scheduled Meds: . atenolol  75 mg Oral Daily  . ciprofloxacin  400 mg Intravenous Q12H  . clopidogrel  75 mg Oral Daily  . enoxaparin (LOVENOX) injection  30 mg Subcutaneous QHS  . pravastatin  20 mg Oral QHS  . sodium chloride  3 mL Intravenous Q12H   Continuous Infusions:      Time spent: 35 minutes    [facility] HospitalELMAHI,Psalm Schappell A  [facility] Pager [phone] If 7PM-7AM, please contact night-coverage at [email or web address], password [email or web address] 12/01/2014, 2:01 PM

## 2014-12-02 LAB — URINE CULTURE

## 2014-12-02 LAB — BASIC METABOLIC PANEL
Anion gap: 6 (ref 5–15)
BUN: 29 mg/dL — AB (ref 6–20)
CHLORIDE: 104 mmol/L (ref 101–111)
CO2: 28 mmol/L (ref 22–32)
CREATININE: 0.79 mg/dL (ref 0.44–1.00)
Calcium: 8.8 mg/dL — ABNORMAL LOW (ref 8.9–10.3)
GFR calc Af Amer: >60 mL/min (ref >60)
Glucose, Bld: 108 mg/dL — ABNORMAL HIGH (ref 70–99)
Potassium: 3.1 mmol/L — ABNORMAL LOW (ref 3.5–5.1)
Sodium: 138 mmol/L (ref 135–145)

## 2014-12-02 LAB — CBC
HEMATOCRIT: 40.5 % (ref 36.0–46.0)
HEMOGLOBIN: 12.5 g/dL (ref 12.0–15.0)
MCH: 26.7 pg (ref 26.0–34.0)
MCHC: 30.9 g/dL (ref 30.0–36.0)
MCV: 86.4 fL (ref 78.0–100.0)
Platelets: 307 10*3/uL (ref 150–400)
RBC: 4.69 MIL/uL (ref 3.87–5.11)
RDW: 15.5 % (ref 11.5–15.5)
WBC: 9.5 10*3/uL (ref 4.0–10.5)

## 2014-12-02 MED ORDER — POTASSIUM CHLORIDE CRYS ER 20 MEQ PO TBCR
60.0000 meq | EXTENDED_RELEASE_TABLET | Freq: Four times a day (QID) | ORAL | Status: AC
  Administered 2014-12-02: 60 meq via ORAL
  Filled 2014-12-02 (×3): qty 3

## 2014-12-02 NOTE — Progress Notes (Addendum)
[facility] PROGRESS NOTE   [name] ZOX:[id number] DOB: June 28, 1922 DOA: 11/30/2014 PCP: Rene Paci, MD  HPI/Subjective: Still complaining about intolerable back pain, add muscle relaxant. No fever or chills. Seen with daughter at bedside had prolonged discussion about compression fractures.   Assessment/Plan: Principal Problem:   Intractable back pain Active Problems:   Essential hypertension   UTI (lower urinary tract infection)   Intractable back pain/compression fractures of T6 and T9 Intractable back pain, CT of the chest/pelvis ruled out acute events. Patient has progressive fractures at T6 and T9 compared to previous studies, likely causing the pain. Control pain with narcotics and benzodiazepines. Patient evaluated by PT and recommended SNF. Waiting for insurance authorization for SNF placement.  UTI Patient started on Rocephin, continued. Urine showed probable UTI, culture showed multiple morphotypes, we will continue Cipro for total 3 days.  Essential hypertension Continue home medications, appears to be controlled.  Hypokalemia Replete with oral supplements.  Code Status: Full Code Family Communication: Plan discussed with the patient. To SNF when bed/insurance authorization available. Disposition Plan: Remains inpatient Diet: Diet regular Room service appropriate?: Yes; Fluid consistency:: Thin  Consultants:  None  Procedures:   none  Antibiotics:  Cipro    Objective: Filed Vitals:   12/02/14 0547  BP: 141/69  Pulse: 66  Temp: 98.4 F (36.9 C)  Resp: 18    Intake/Output Summary (Last 24 hours) at 12/02/14 1110 Last data filed at 12/01/14 2327  Gross per 24 hour  Intake    520 ml  Output      0 ml  Net    520 ml   Filed Weights   11/30/14 2140 11/30/14 2243  Weight: 20.4 kg (44 lb 15.6 oz) 50.4 kg (111 lb 1.8 oz)    Exam: General: Alert and awake, oriented x3, not in any acute distress. HEENT: anicteric sclera,  pupils reactive to light and accommodation, EOMI CVS: S1-S2 clear, no murmur rubs or gallops Chest: clear to auscultation bilaterally, no wheezing, rales or rhonchi Abdomen: soft nontender, nondistended, normal bowel sounds, no organomegaly Extremities: no cyanosis, clubbing or edema noted bilaterally Neuro: Cranial nerves II-XII intact, no focal neurological deficits  Data Reviewed: Basic Metabolic Panel:  Recent Labs Lab 11/30/14 1641 12/01/14 0420 12/02/14 0355  NA 140 141 138  K 4.2 3.4* 3.1*  CL 104 106 104  CO2 GLUCOSE 120* 114* 108*  BUN 30* 26* 29*  CREATININE 0.76 0.66 0.79  CALCIUM 9.4 9.1 8.8*   Liver Function Tests:  Recent Labs Lab 11/30/14 1641  AST 17  ALT 11*  ALKPHOS 104  BILITOT 0.8  PROT 8.1  ALBUMIN 4.6    Recent Labs Lab 11/30/14 1641  LIPASE 23   No results for input(s): AMMONIA in the last 168 hours. CBC:  Recent Labs Lab 11/30/14 1641 12/01/14 0420 12/02/14 0355  WBC 10.1 9.1 9.5  NEUTROABS 7.5  --   --   HGB 13.7 11.8* 12.5  HCT 42.7 37.5 40.5  MCV 86.3 85.4 86.4  PLT 317 300 307   Cardiac Enzymes: No results for input(s): CKTOTAL, CKMB, CKMBINDEX, TROPONINI in the last 168 hours. BNP (last 3 results) No results for input(s): BNP in the last 8760 hours.  ProBNP (last 3 results) No results for input(s): PROBNP in the last 8760 hours.  CBG: No results for input(s): GLUCAP in the last 168 hours.  Micro Recent Results (from the past 240 hour(s))  Urine culture     Status: None  Collection Time: 11/30/14  5:44 PM  Result Value Ref Range Status   Specimen Description URINE, CLEAN CATCH  Final   Special Requests NONE  Final   Colony Count   Final    50,000 COLONIES/ML [facility]    Culture   Final    Multiple bacterial morphotypes present, none predominant. Suggest appropriate recollection if clinically indicated. Performed at [facility]    Report Status 12/02/2014 FINAL   Final     Studies: Dg Ribs Unilateral W/chest Right  11/30/2014   CLINICAL DATA:  Chronic right upper back pain no trauma  EXAM: RIGHT RIBS AND CHEST - 3+ VIEW  COMPARISON:  Numerous prior studies including 07/23/2012  FINDINGS: Mild cardiac enlargement. Pronounced uncoiling of the aorta. Lungs are clear except for mild scarring or atelectasis at the bases. Bony thorax is intact.  IMPRESSION: No acute finding   Electronically Signed   By: Esperanza Heiraymond  Rubner M.D.   On: 11/30/2014 17:41   Ct Angio Chest Pe W/cm &/or Wo Cm  11/30/2014   CLINICAL DATA:  Right mid back pain. Concern pulmonary embolism. Urinary symptoms.  EXAM: CT ANGIOGRAPHY CHEST  CT ABDOMEN AND PELVIS WITH CONTRAST  TECHNIQUE: Multidetector CT imaging of the chest was performed using the standard protocol during bolus administration of intravenous contrast. Multiplanar CT image reconstructions and MIPs were obtained to evaluate the vascular anatomy. Multidetector CT imaging of the abdomen and pelvis was performed using the standard protocol during bolus administration of intravenous contrast.  CONTRAST:  100mL OMNIPAQUE IOHEXOL 350 MG/ML SOLN  COMPARISON:  CT 07/23/2012  FINDINGS: CTA CHEST FINDINGS  Mediastinum/Nodes: No filling defects within the pulmonary arteries to suggest acute pulmonary embolism. Acute findings aorta great vessels. There are intimal calcification of the aorta. There soft and calcified plaque within the aorta. No pericardial fluid. Esophagus is normal.  Lungs/Pleura: Centrilobular emphysema in the lung apices. No pulmonary edema. Mild linear scarring at the left lung base and right lung base.  Musculoskeletal: No aggressive osseous lesion.  CT ABDOMEN and PELVIS FINDINGS  Hepatobiliary: No biliary duct dilatation. Gallbladder is normal. No focal hepatic lesion. Common bile duct normal caliber.  Pancreas: Pancreas is normal. No ductal dilatation. No pancreatic inflammation.  Spleen: Normal spleen  Adrenals/urinary tract: There  is a 23 mm lesion of the right adrenal gland which has washout characteristics characteristic of a benign adenoma. No hydronephrosis. No ureteral lithiasis. Normal bladder.  Stomach/Bowel: Stomach, small bowel, appendix, and cecum are normal. The colon and rectosigmoid colon are normal.  Vascular/Lymphatic: Abdominal aorta is normal caliber. There is no retroperitoneal or periportal lymphadenopathy. No pelvic lymphadenopathy. Heavy calcification of the aorta.  Reproductive: Post hysterectomy anatomy.  Musculoskeletal: No aggressive osseous lesion. There is a compression fractures of the T6 and T9 vertebral bodies which are progressed from comparison exam.  Review of the MIP images confirms the above findings.  IMPRESSION: Chest Impression:  1. No acute pulmonary embolism. 2. No pneumonia or pleural fluid. Abdomen / Pelvis Impression:  1. No Acute findings the abdomen pelvis. 2. Benign right adrenal adenoma. 3. Atherosclerotic calcification aorta. 4. Progressive compression fractures at T6 and T9 compared to CT of 07/23/2012.   Electronically Signed   By: Genevive BiStewart  Edmunds M.D.   On: 11/30/2014 19:10   Ct Abdomen Pelvis W Contrast  11/30/2014   CLINICAL DATA:  Right mid back pain. Concern pulmonary embolism. Urinary symptoms.  EXAM: CT ANGIOGRAPHY CHEST  CT ABDOMEN AND PELVIS WITH CONTRAST  TECHNIQUE:  Multidetector CT imaging of the chest was performed using the standard protocol during bolus administration of intravenous contrast. Multiplanar CT image reconstructions and MIPs were obtained to evaluate the vascular anatomy. Multidetector CT imaging of the abdomen and pelvis was performed using the standard protocol during bolus administration of intravenous contrast.  CONTRAST:  OMNIPAQUE IOHEXOL 350 MG/ML SOLN  COMPARISON:  CT 07/23/2012  FINDINGS: CTA CHEST FINDINGS  Mediastinum/Nodes: No filling defects within the pulmonary arteries to suggest acute pulmonary embolism. Acute findings aorta great vessels.  There are intimal calcification of the aorta. There soft and calcified plaque within the aorta. No pericardial fluid. Esophagus is normal.  Lungs/Pleura: Centrilobular emphysema in the lung apices. No pulmonary edema. Mild linear scarring at the left lung base and right lung base.  Musculoskeletal: No aggressive osseous lesion.  CT ABDOMEN and PELVIS FINDINGS  Hepatobiliary: No biliary duct dilatation. Gallbladder is normal. No focal hepatic lesion. Common bile duct normal caliber.  Pancreas: Pancreas is normal. No ductal dilatation. No pancreatic inflammation.  Spleen: Normal spleen  Adrenals/urinary tract: There is a 23 mm lesion of the right adrenal gland which has washout characteristics characteristic of a benign adenoma. No hydronephrosis. No ureteral lithiasis. Normal bladder.  Stomach/Bowel: Stomach, small bowel, appendix, and cecum are normal. The colon and rectosigmoid colon are normal.  Vascular/Lymphatic: Abdominal aorta is normal caliber. There is no retroperitoneal or periportal lymphadenopathy. No pelvic lymphadenopathy. Heavy calcification of the aorta.  Reproductive: Post hysterectomy anatomy.  Musculoskeletal: No aggressive osseous lesion. There is a compression fractures of the T6 and T9 vertebral bodies which are progressed from comparison exam.  Review of the MIP images confirms the above findings.  IMPRESSION: Chest Impression:  1. No acute pulmonary embolism. 2. No pneumonia or pleural fluid. Abdomen / Pelvis Impression:  1. No Acute findings the abdomen pelvis. 2. Benign right adrenal adenoma. 3. Atherosclerotic calcification aorta. 4. Progressive compression fractures at T6 and T9 compared to CT of 07/23/2012.   Electronically Signed   By: Genevive Bi M.D.   On: 11/30/2014 19:10    Scheduled Meds: . atenolol  75 mg Oral Daily  . ciprofloxacin  400 mg Intravenous Q12H  . clopidogrel  75 mg Oral Daily  . enoxaparin (LOVENOX) injection  30 mg Subcutaneous QHS  . potassium chloride   60 mEq Oral Q6H  . pravastatin  20 mg Oral QHS  . sodium chloride  3 mL Intravenous Q12H   Continuous Infusions:      Time spent: 35 minutes    [facility] A  [facility] Pager [phone] If 7PM-7AM, please contact night-coverage at [email or web address], password [facility] 12/02/2014, 11:10 AM  LOS: 1 day

## 2014-12-02 NOTE — Care Management Note (Signed)
Case Management Note  Patient Details  Name: [name] MRN: [id number] Date of Birth: 12/12/1921  Subjective/Objective:       81 yo admitted with Intractable back pain             [facility] To [facility]F at [facility]  Expected Discharge Date:   (UNKNOWN)               Expected Discharge Plan:  Skilled Nursing Facility  In-House Referral:  Clinical Social Work  Discharge planning Services  CM Consult  Post Acute Care Choice:    Choice offered to:     DME Arranged:    DME Agency:     HH Arranged:    HH Agency:     Status of Service:  In process, will continue to follow  Medicare Important Message Given:    Date Medicare IM Given:    Medicare IM give by:    Date Additional Medicare IM Given:    Additional Medicare Important Message give by:     If discussed at Long Length of Stay Meetings, dates discussed:    Additional Comments:  Bartholome BillCLEMENTS, Marika Mahaffy H, RN 12/02/2014, 2:18 PM

## 2014-12-02 NOTE — Progress Notes (Signed)
CSW continuing to follow.   CSW followed up with pt and pt daughter at bedside regarding SNF bed offers.   CSW clarified pt and pt daughter's questions surrounding SNF bed offers and insurance coverage.   Pt [facility] bed [facility] as facility has private room availability.   CSW contacted facility and notified facility that pt accepts bed offers. [facility] plans to initiate insurance authorization through [employer].   CSW provided support to pt as pt discussed that she continues to have pain and just took her medicine, so pt is hopeful for relief from pain soon.   CSW to continue to follow to assist with pt discharge needs when pt medically ready for discharge and insurance auth received.   Loletta SpecterSuzanna Kidd, MSW, LCSW Clinical Social Work [phone]

## 2014-12-02 NOTE — Clinical Social Work Placement (Signed)
   CLINICAL SOCIAL WORK PLACEMENT  NOTE  Date:  12/02/2014  Patient Details  Name: Margaret StadeRuth Siordia MRN: [id number] Date of Birth: 10/30/1921  Clinical Social Work is seeking post-discharge placement for this patient at the Skilled  Nursing Facility level of care (*CSW will initial, date and re-position this form in  chart as items are completed):  Yes   Patient/family provided with [facility] Clinical Social Work Department's list of facilities offering this level of care within the geographic area requested by the patient (or if unable, by the patient's family).  Yes   Patient/family informed of their freedom to choose among providers that offer the needed level of care, that participate in Medicare, Medicaid or managed care program needed by the patient, have an available bed and are willing to accept the patient.  Yes   Patient/family informed of [facility]'s ownership interest in [facility] and [facility], as well as of the fact that they are under no obligation to receive care at these facilities.  PASRR submitted to EDS on       PASRR number received on       Existing PASRR number confirmed on 12/01/14     FL2 transmitted to all facilities in geographic area requested by pt/family on 12/01/14     FL2 transmitted to all facilities within larger geographic area on       Patient informed that his/her managed care company has contracts with or will negotiate with certain facilities, including the following:        Yes   Patient/family informed of bed offers received.  [facility]     Physician recommends and patient chooses bed at      Patient to be transferred to   on  .  Patient to be transferred to facility by       Patient family notified on   of transfer.  Name of family member notified:        PHYSICIAN Please sign FL2     Additional Comment:    _______________________________________________ Orson EvaKIDD, Adreonna Yontz A, LCSW 12/02/2014,  10:29 AM

## 2014-12-03 DIAGNOSIS — S22000A Wedge compression fracture of unspecified thoracic vertebra, initial encounter for closed fracture: Secondary | ICD-10-CM | POA: Diagnosis present

## 2014-12-03 LAB — BASIC METABOLIC PANEL
Anion gap: 4 — ABNORMAL LOW (ref 5–15)
BUN: 23 mg/dL — ABNORMAL HIGH (ref 6–20)
CALCIUM: 8.8 mg/dL — AB (ref 8.9–10.3)
CO2: 29 mmol/L (ref 22–32)
CREATININE: 0.78 mg/dL (ref 0.44–1.00)
Chloride: 107 mmol/L (ref 101–111)
GFR calc Af Amer: >60 mL/min (ref >60)
GFR calc non Af Amer: >60 mL/min (ref >60)
GLUCOSE: 115 mg/dL — AB (ref 70–99)
Potassium: 4.5 mmol/L (ref 3.5–5.1)
Sodium: 140 mmol/L (ref 135–145)

## 2014-12-03 LAB — MAGNESIUM: Magnesium: 2 mg/dL (ref 1.7–2.4)

## 2014-12-03 MED ORDER — DOCUSATE SODIUM 100 MG PO CAPS
100.0000 mg | ORAL_CAPSULE | Freq: Two times a day (BID) | ORAL | Status: DC
  Administered 2014-12-04: 100 mg via ORAL
  Filled 2014-12-03 (×4): qty 1

## 2014-12-03 MED ORDER — CALCITONIN (SALMON) 200 UNIT/ACT NA SOLN
1.0000 | Freq: Every day | NASAL | Status: DC
  Filled 2014-12-03: qty 3.7

## 2014-12-03 MED ORDER — ACETAMINOPHEN 325 MG PO TABS
650.0000 mg | ORAL_TABLET | Freq: Three times a day (TID) | ORAL | Status: DC
  Administered 2014-12-03 – 2014-12-04 (×2): 650 mg via ORAL
  Filled 2014-12-03 (×4): qty 2

## 2014-12-03 MED ORDER — OXYCODONE HCL 5 MG PO TABS: 5.0000 mg | ORAL_TABLET | ORAL | Status: DC | PRN

## 2014-12-03 MED ORDER — HYDROMORPHONE HCL 2 MG PO TABS
2.0000 mg | ORAL_TABLET | ORAL | Status: DC | PRN
  Administered 2014-12-03 – 2014-12-04 (×5): 2 mg via ORAL
  Filled 2014-12-03 (×5): qty 1

## 2014-12-03 MED ORDER — MORPHINE SULFATE ER 15 MG PO TBCR
15.0000 mg | EXTENDED_RELEASE_TABLET | Freq: Two times a day (BID) | ORAL | Status: DC
  Administered 2014-12-03 – 2014-12-04 (×2): 15 mg via ORAL
  Filled 2014-12-03 (×3): qty 1

## 2014-12-03 MED ORDER — FENTANYL 12 MCG/HR TD PT72: 12.5000 ug | MEDICATED_PATCH | TRANSDERMAL | Status: DC

## 2014-12-03 NOTE — Progress Notes (Addendum)
CSW continuing to follow.   CSW [facility] from [facility] that facility received insurance authorization from [facility].   CSW notified MD.   CSW awaiting MD to round on pt to determine if pt medically ready for d/c today.  Addendum 2:30 pm:  CSW received notification from MD that pt not yet medically ready today.  CSW updated [facility].  CSW to continue to follow to provide support and assist with pt discharge planning needs to [facility] when medically ready.   Loletta SpecterSuzanna Kidd, MSW, LCSW Clinical Social Work [phone]

## 2014-12-03 NOTE — Progress Notes (Addendum)
[facility] PROGRESS NOTE  [name] ZOX:[id number] DOB: 03/09/1922 DOA: 11/30/2014 PCP: Rene PaciValerie Leschber, MD  Assessment/Plan: #1 intractable back pain secondary to compression fractures of T6 and T9 Secondary to compression fractures of T6 and T9 noted on CT chest abdomen and pelvis. CT chest negative for PE. CT abdomen and pelvis with no acute abnormalities. CT did show progressive fractures of T6 and T9 compared to prior CT scan. Patient with significant pain. Will place on a MS Contin 15 mg BID, with oral dilaudid for breakthrough pain. Calcitonin nasal spray. D/C oxycodone. Patient refusing evaluation for vertebroplasty. Patient has been seen by PT. To skilled nursing facility when pain under better control.  #2 bacteria in urine. Urinalysis with small leukocytes nitrite negative. WBCs 21-50. Urine cultures with 50,000 colonies of multiple bacterial morphotypes. Patient on IV Cipro. Will discontinue antibiotics.  #3 hypertension Stable. Continue current regimen of atenolol.  #4 hyperlipidemia Continue Pravachol.  #5 prophylaxis Lovenox for DVT prophylaxis.  Code Status: Full Family Communication: Updated patient and daughter at bedside. Disposition Plan: To skilled nursing facility when pain is better controlled hopefully in the next 24-48 hours.   Consultants:  None  Procedures:  CT angiogram chest 11/30/2014  CT abdomen and pelvis 11/30/2014  Antibiotics:  IV ciprofloxacin 11/30/2014  HPI/Subjective: Patient states she's had a bad day. Patient complaining of significant back pain. No shortness of breath. No chest pain.  Objective: Filed Vitals:   12/03/14 0429  BP: 137/70  Pulse: 68  Temp: 98.2 F (36.8 C)  Resp: 16    Intake/Output Summary (Last 24 hours) at 12/03/14 1346 Last data filed at 12/03/14 0900  Gross per 24 hour  Intake    520 ml  Output    140 ml  Net    380 ml   Filed Weights   11/30/14 2140 11/30/14 2243  Weight: 20.4 kg (44  lb 15.6 oz) 50.4 kg (111 lb 1.8 oz)    Exam:   General:  NAD  Cardiovascular: RRR  Respiratory: CTAB anterior lung fields  Abdomen: Soft, nontender, nondistended, positive bowel sounds.  Musculoskeletal: No clubbing cyanosis or edema.  Data Reviewed: Basic Metabolic Panel:  Recent Labs Lab 11/30/14 1641 12/01/14 0420 12/02/14 0355 12/03/14 0840  NA 140 141 138 140  K 4.2 3.4* 3.1* 4.5  CL 104 106 104 107  CO2 26 27 28 29   GLUCOSE 120* 114* 108* 115*  BUN 30* 26* 29* 23*  CREATININE 0.76 0.66 0.79 0.78  CALCIUM 9.4 9.1 8.8* 8.8*  MG  --   --   --  2.0   Liver Function Tests:  Recent Labs Lab 11/30/14 1641  AST 17  ALT 11*  ALKPHOS 104  BILITOT 0.8  PROT 8.1  ALBUMIN 4.6    Recent Labs Lab 11/30/14 1641  LIPASE 23   No results for input(s): AMMONIA in the last 168 hours. CBC:  Recent Labs Lab 11/30/14 1641 12/01/14 0420 12/02/14 0355  WBC 10.1 9.1 9.5  NEUTROABS 7.5  --   --   HGB 13.7 11.8* 12.5  HCT 42.7 37.5 40.5  MCV 86.3 85.4 86.4  PLT 317 300 307   Cardiac Enzymes: No results for input(s): CKTOTAL, CKMB, CKMBINDEX, TROPONINI in the last 168 hours. BNP (last 3 results) No results for input(s): BNP in the last 8760 hours.  ProBNP (last 3 results) No results for input(s): PROBNP in the last 8760 hours.  CBG: No results for input(s): GLUCAP in the last 168 hours.  Recent  Results (from the past 240 hour(s))  Urine culture     Status: None   Collection Time: 11/30/14  5:44 PM  Result Value Ref Range Status   Specimen Description URINE, CLEAN CATCH  Final   Special Requests NONE  Final   Colony Count   Final    50,000 COLONIES/ML Performed at [facility]    Culture   Final    Multiple bacterial morphotypes present, none predominant. Suggest appropriate recollection if clinically indicated. Performed at [facility]    Report Status 12/02/2014 FINAL  Final     Studies: No results found.  Scheduled Meds: .  atenolol  75 mg Oral Daily  . ciprofloxacin  400 mg Intravenous Q12H  . clopidogrel  75 mg Oral Daily  . enoxaparin (LOVENOX) injection  30 mg Subcutaneous QHS  . fentaNYL  12.5 mcg Transdermal Q72H  . pravastatin  20 mg Oral QHS  . sodium chloride  3 mL Intravenous Q12H   Continuous Infusions:   Principal Problem:   Intractable back pain Active Problems:   Essential hypertension   Bacteria in urine   Compression fracture of thoracic vertebra    Time spent: 35 minutes    Darol Cush M.D. [facility] Pager [phone]. If 7PM-7AM, please contact night-coverage at [email or web address], password [facility] 12/03/2014, 1:46 PM  LOS: 2 days

## 2014-12-03 NOTE — Progress Notes (Signed)
Physical Therapy Treatment Patient Details Name: [name] MRN: [id number] DOB: 07/26/1922 Today's Date: 12/03/2014    History of Present Illness 81 yo female admitted with back pain, progressive compression fractures T6, T9. Hx of CAD, HTN. Pt is from ALF    PT Comments    Pt was in bed HOB elevated with daughter present; Assisted pt OOB to EOB via log rolling (for comfort) ; Pt c/o pain during bed mobility assisted pt with LE; PT sitting EOB then sit to stand to walker; Once standing pt stated needed to us BR assisted to BR (notified RN); Ambulate from BR to Unit hall 30 ft; pt stated "i cant it hurts I need to sit" sat in chair and returned pt back to room; Remained in chair to eat lunch but wants to return to bed after lunch notified RN.   Follow Up Recommendations  SNF     Equipment Recommendations  Rolling walker with 5" wheels    Recommendations for Other Services       Precautions / Restrictions Precautions Precautions: Fall Precaution Comments: logroll/back precautions for comfort Restrictions Weight Bearing Restrictions: No    Mobility  Bed Mobility Overal bed mobility: Needs Assistance Bed Mobility: Rolling;Supine to Sit Rolling: Min guard;Min assist         General bed mobility comments: Increased time. Pt c/o pain VC for technique   Transfers Overall transfer level: Needs assistance Equipment used: Rolling walker (2 wheeled) Transfers: Sit to/from Stand Sit to Stand: Min assist;From elevated surface         General transfer comment: Assist to rise, stabilize, control descent. VCs safety, technique, hand placement.   Ambulation/Gait Ambulation/Gait assistance: Min assist Ambulation Distance (Feet): 30 Feet Assistive device: Rolling walker (2 wheeled) Gait Pattern/deviations: Step-through pattern;Decreased stance time - right;Trunk flexed     General Gait Details: Assist with walker. Favors R LE due to previous hip fx per pt. R LE externally  rotated   Stairs            Wheelchair Mobility    Modified Rankin (Stroke Patients Only)       Balance                                    Cognition Arousal/Alertness: Awake/alert Behavior During Therapy: WFL for tasks assessed/performed Overall Cognitive Status: Within Functional Limits for tasks assessed                      Exercises      General Comments        Pertinent Vitals/Pain Pain Assessment: 0-10 Pain Score: 5  (C/o pain in side) Pain Location: Mid/Upper back R and L side Pain Intervention(s): Monitored during session;Premedicated before session;Limited activity within patient's tolerance;Repositioned    Home Living                      Prior Function            PT Goals (current goals can now be found in the care plan section) Progress towards PT goals: Progressing toward goals    Frequency  Min 3X/week    PT Plan      Co-evaluation             End of Session Equipment Utilized During Treatment: Gait belt Activity Tolerance: Patient tolerated treatment well Patient left: in chair;with family/visitor present;with call bell/phone within  reach     Time:   1137-1203    Charges:    1 gt 1 ta                   G Codes:      Duric,Sreto Student PTA  12/03/2014, 12:18 PM   Reviewed and agree with above  Felecia ShellingLori Shelba Susi  PTA [facility]  Acute  Rehab Pager      [phone]

## 2014-12-04 LAB — BASIC METABOLIC PANEL
ANION GAP: 5 (ref 5–15)
BUN: 24 mg/dL — ABNORMAL HIGH (ref 6–20)
CHLORIDE: 105 mmol/L (ref 101–111)
CO2: 26 mmol/L (ref 22–32)
CREATININE: 0.75 mg/dL (ref 0.44–1.00)
Calcium: 8.6 mg/dL — ABNORMAL LOW (ref 8.9–10.3)
GFR calc Af Amer: >60 mL/min (ref >60)
GFR calc non Af Amer: >60 mL/min (ref >60)
GLUCOSE: 107 mg/dL — AB (ref 70–99)
Potassium: 3.9 mmol/L (ref 3.5–5.1)
Sodium: 136 mmol/L (ref 135–145)

## 2014-12-04 MED ORDER — MORPHINE SULFATE ER 15 MG PO TBCR: 15.0000 mg | EXTENDED_RELEASE_TABLET | Freq: Two times a day (BID) | ORAL | Status: DC

## 2014-12-04 MED ORDER — HYDROMORPHONE HCL 2 MG PO TABS
2.0000 mg | ORAL_TABLET | ORAL | Status: DC | PRN
Start: 2014-12-04 — End: 2015-02-04

## 2014-12-04 MED ORDER — DOCUSATE SODIUM 100 MG PO CAPS: 100.0000 mg | ORAL_CAPSULE | Freq: Two times a day (BID) | ORAL | Status: DC

## 2014-12-04 MED ORDER — ACETAMINOPHEN 325 MG PO TABS: 650.0000 mg | ORAL_TABLET | Freq: Three times a day (TID) | ORAL | Status: DC

## 2014-12-04 MED ORDER — CALCITONIN (SALMON) 200 UNIT/ACT NA SOLN: 1.0000 | Freq: Every day | NASAL | Status: DC

## 2014-12-04 NOTE — Progress Notes (Signed)
[facility].  CSW facilitated pt discharge needs including contacting facility, faxing pt discharge information via [facility], discussing with pt and pt daughter at bedside, providing RN phone number to call report, and arranging ambulance transport for pt to [facility].   [facility] received [employer] Medicare insurance authorization on 5/4.  Pt expressed anxiousness about moving to facility as she is worried about being in more pain during transfer. CSW explained that CSW spoke with RN who notified CSW that RN had just administered pain medication, so hopeful that will assist in transfer. Pt daughter at bedside to provide support.   No further social work needs identified at this time.  CSW signing off.   Loletta SpecterSuzanna Raymie Trani, MSW, LCSW Clinical Social Work [phone]

## 2014-12-04 NOTE — Clinical Social Work Placement (Signed)
   CLINICAL SOCIAL WORK PLACEMENT  NOTE  Date:  12/04/2014  Patient Details  Name: [name] MRN: [id number] Date of Birth: 11/27/1921  Clinical Social Work is seeking post-discharge placement for this patient at the Skilled  Nursing Facility level of care (*CSW will initial, date and re-position this form in  chart as items are completed):  Yes   Patient/family provided with [facility] Clinical Social Work Department's list of facilities offering this level of care within the geographic area requested by the patient (or if unable, by the patient's family).  Yes   Patient/family informed of their freedom to choose among providers that offer the needed level of care, that participate in Medicare, Medicaid or managed care program needed by the patient, have an available bed and are willing to accept the patient.  Yes   Patient/family informed of [facility]'s ownership interest in [facility] and [facility], as well as of the fact that they are under no obligation to receive care at these facilities.  PASRR submitted to EDS on       PASRR number received on       Existing PASRR number confirmed on 12/01/14     FL2 transmitted to all facilities in geographic area requested by pt/family on 12/01/14     FL2 transmitted to all facilities within larger geographic area on       Patient informed that his/her managed care company has contracts with or will negotiate with certain facilities, including the following:        Yes   Patient/family informed of bed offers received.  [facility]     Physician recommends and patient chooses bed at      [facility].  Patient to be transferred to facility by ambulance Sharin Mons(PTAR)     Patient family notified on 12/04/14 of transfer.  Name of family member notified:  pt and pt daughter, Diane notified at bedside     PHYSICIAN Please sign FL2     Additional Comment:     _______________________________________________ Orson EvaKIDD, SUZANNA A, LCSW 12/04/2014, 2:04 PM

## 2014-12-04 NOTE — Progress Notes (Signed)
Patient d/ced to [facility].  Report was called to [phone].  Patient was transported via EMS with information packet supplied by case manager.

## 2014-12-04 NOTE — Discharge Summary (Signed)
Physician Discharge Summary  Margaret StadeRuth Meenach BJY:[id number] DOB: 05/11/1922 DOA: 11/30/2014  PCP: Rene PaciValerie Leschber, MD  Admit date: 11/30/2014 Discharge date: 12/04/2014  Time spent: 65 minutes  Recommendations for Outpatient Follow-up:  1. [facility]. Patient will follow-up with M.D. at skilled nursing facility. Patient will need a basic metabolic profile done in 1 week to follow-up on electrolytes and renal function. Patient's pain medications may be adjusted accordingly per M.D. at skilled nursing facility.  Discharge Diagnoses:  Principal Problem:   Intractable back pain Active Problems:   Compression fracture of thoracic vertebra   Essential hypertension   Bacteria in urine   Discharge Condition: Stable and improved  Diet recommendation: Regular  Filed Weights   11/30/14 2140 11/30/14 2243  Weight: 20.4 kg (44 lb 15.6 oz) 50.4 kg (111 lb 1.8 oz)    History of present illness:  [name] is a 81 y.o. female with a history of CAD, HTN, Hyperlipidemia who presented to the ED with complaints of 10/10 constant Right upper Thoracic area Back pain x 3 days. She reported that she twisted her back while trying to get up from her bed. She reported having increased pain with moving and changing positions since her hip surgery. She denied any falls or trauma. She had been using her home pain Rx of Oxycodone without relief. She had not been able to walk due to her increased pain. Imaging studies have been negative for fractures or acute pathology. A CTA of the Chest was performed due to an elevated D-Dimer and was negative for a Pulmonary Embolism. She also has complaints of dysuria but denies any fever or chills. She was found to have a UTI in the ED and a Urine culture was ordered and she was placed on IV Cipro   Hospital Course:  #1 intractable back pain secondary to compression fractures of T6 and T9 Secondary  to compression fractures of T6 and T9 noted on CT chest abdomen and pelvis. CT chest negative for PE. CT abdomen and pelvis with no acute abnormalities. CT did show progressive fractures of T6 and T9 compared to prior CT scan. Patient with significant pain while on oxycodone when necessary as well as IV dilaudid. Patient was subsequently placed on MS Contin 15 mg BID for long-acting, with oral dilaudid for breakthrough pain. Calcitonin nasal spray was also added to the regimen. Patient's oxycodone was discontinued. Patient was also placed on scheduled Tylenol. Patient's pain was significantly improved on this regimen. A consultation to interventional radiology for possible vertebroplasty was discussed with patient however patient refused. Patient was seen by physical therapy and skilled nursing facility was recommended. Patient's pain was much better controlled and patient be discharged to a skilled nursing facility in stable and improved condition. Patient's pain medications may be adjusted as needed.  #2 bacteria in urine. Urinalysis with small leukocytes nitrite negative. WBCs 21-50. Urine cultures with 50,000 colonies of multiple bacterial morphotypes. Patient was placed empirically on IV Cipro on admission while urine cultures were pending. Patient remained afebrile. IV ciprofloxacin was discontinued with negative cultures.   #3 hypertension Stable. Continued on home regimen of atenolol.  #4 hyperlipidemia Continued on home regimen Pravachol.   Procedures:  CT angiogram chest 11/30/2014  CT abdomen and pelvis 11/30/2014    Consultations:  None  Discharge Exam: Filed Vitals:   12/04/14 0557  BP: 123/68  Pulse: 72  Temp: 97.7 F (36.5 C)  Resp: 18  General: NAD Cardiovascular: RRR Respiratory: CTAB  Discharge Instructions   Discharge Instructions    Diet general    Complete by:  As directed      Discharge instructions    Complete by:  As directed   Follow up with MD  at SNF.     Increase activity slowly    Complete by:  As directed           Current Discharge Medication List    START taking these medications   Details  acetaminophen (TYLENOL) 325 MG tablet Take 2 tablets (650 mg total) by mouth 3 (three) times daily. Take for 5 days scheduled, then every 4 hours as needed.    calcitonin, salmon, (MIACALCIN/FORTICAL) 200 UNIT/ACT nasal spray Place 1 spray into alternate nostrils daily. Qty: 3.7 mL, Refills: 0    docusate sodium (COLACE) 100 MG capsule Take 1 capsule (100 mg total) by mouth 2 (two) times daily. Qty: 10 capsule, Refills: 0    HYDROmorphone (DILAUDID) 2 MG tablet Take 1 tablet (2 mg total) by mouth every 4 (four) hours as needed for moderate pain or severe pain. Qty: 20 tablet, Refills: 0    morphine (MS CONTIN) 15 MG 12 hr tablet Take 1 tablet (15 mg total) by mouth every 12 (twelve) hours. Qty: 60 tablet, Refills: 0      CONTINUE these medications which have NOT CHANGED   Details  !! atenolol (TENORMIN) 25 MG tablet Take 1 tablet (25 mg total) by mouth daily. Take with 50mg  tab for total 75mg  daily Qty: 90 tablet, Refills: 3    !! atenolol (TENORMIN) 50 MG tablet Take 1 tablet (50 mg total) by mouth daily. Take with 25mg  tab for total 75mg  daily Qty: 90 tablet, Refills: 3    clopidogrel (PLAVIX) 75 MG tablet TAKE 1 TABLET (75 MG TOTAL) BY MOUTH DAILY. Qty: 90 tablet, Refills: 2    pravastatin (PRAVACHOL) 20 MG tablet TAKE 1 TABLET (20 MG TOTAL) BY MOUTH DAILY. Qty: 90 tablet, Refills: 2     !! - Potential duplicate medications found. Please discuss with provider.    STOP taking these medications     oxyCODONE (OXY IR/ROXICODONE) 5 MG immediate release tablet        Allergies  Allergen Reactions  . Codeine     Unknown  . Penicillins   . Sulfonamide Derivatives     Unknown.    Follow-up Information    Please follow up.   Why:  F/U with MD at SNF       The results of significant diagnostics from this  hospitalization (including imaging, microbiology, ancillary and laboratory) are listed below for reference.    Significant Diagnostic Studies: Dg Ribs Unilateral W/chest Right  11/30/2014   CLINICAL DATA:  Chronic right upper back pain no trauma  EXAM: RIGHT RIBS AND CHEST - 3+ VIEW  COMPARISON:  Numerous prior studies including 07/23/2012  FINDINGS: Mild cardiac enlargement. Pronounced uncoiling of the aorta. Lungs are clear except for mild scarring or atelectasis at the bases. Bony thorax is intact.  IMPRESSION: No acute finding   Electronically Signed   By: Esperanza Heir M.D.   On: 11/30/2014 17:41   Dg Thoracic Spine W/swimmers  11/27/2014   CLINICAL DATA:  Strained muscle at upper back while twisting. Upper back pain. Initial encounter.  EXAM: THORACIC SPINE - 2 VIEW + SWIMMERS  COMPARISON:  CTA of the chest performed 07/23/2012  FINDINGS: There is no evidence of fracture or subluxation. Vertebral  bodies demonstrate normal height and alignment. Mild vacuum phenomenon and endplate sclerotic change are noted along the upper lumbar spine. Mild degenerative change is noted at the lower cervical spine. Remaining intervertebral disc spaces are preserved.  The visualized portions of both lungs are clear. The mediastinum is unremarkable in appearance.  IMPRESSION: No evidence of fracture or subluxation along the thoracic spine.   Electronically Signed   By: Roanna Raider M.D.   On: 11/27/2014 23:51   Ct Angio Chest Pe W/cm &/or Wo Cm  11/30/2014   CLINICAL DATA:  Right mid back pain. Concern pulmonary embolism. Urinary symptoms.  EXAM: CT ANGIOGRAPHY CHEST  CT ABDOMEN AND PELVIS WITH CONTRAST  TECHNIQUE: Multidetector CT imaging of the chest was performed using the standard protocol during bolus administration of intravenous contrast. Multiplanar CT image reconstructions and MIPs were obtained to evaluate the vascular anatomy. Multidetector CT imaging of the abdomen and pelvis was performed using the  standard protocol during bolus administration of intravenous contrast.  CONTRAST:  OMNIPAQUE IOHEXOL 350 MG/ML SOLN  COMPARISON:  CT 07/23/2012  FINDINGS: CTA CHEST FINDINGS  Mediastinum/Nodes: No filling defects within the pulmonary arteries to suggest acute pulmonary embolism. Acute findings aorta great vessels. There are intimal calcification of the aorta. There soft and calcified plaque within the aorta. No pericardial fluid. Esophagus is normal.  Lungs/Pleura: Centrilobular emphysema in the lung apices. No pulmonary edema. Mild linear scarring at the left lung base and right lung base.  Musculoskeletal: No aggressive osseous lesion.  CT ABDOMEN and PELVIS FINDINGS  Hepatobiliary: No biliary duct dilatation. Gallbladder is normal. No focal hepatic lesion. Common bile duct normal caliber.  Pancreas: Pancreas is normal. No ductal dilatation. No pancreatic inflammation.  Spleen: Normal spleen  Adrenals/urinary tract: There is a 23 mm lesion of the right adrenal gland which has washout characteristics characteristic of a benign adenoma. No hydronephrosis. No ureteral lithiasis. Normal bladder.  Stomach/Bowel: Stomach, small bowel, appendix, and cecum are normal. The colon and rectosigmoid colon are normal.  Vascular/Lymphatic: Abdominal aorta is normal caliber. There is no retroperitoneal or periportal lymphadenopathy. No pelvic lymphadenopathy. Heavy calcification of the aorta.  Reproductive: Post hysterectomy anatomy.  Musculoskeletal: No aggressive osseous lesion. There is a compression fractures of the T6 and T9 vertebral bodies which are progressed from comparison exam.  Review of the MIP images confirms the above findings.  IMPRESSION: Chest Impression:  1. No acute pulmonary embolism. 2. No pneumonia or pleural fluid. Abdomen / Pelvis Impression:  1. No Acute findings the abdomen pelvis. 2. Benign right adrenal adenoma. 3. Atherosclerotic calcification aorta. 4. Progressive compression fractures at T6  and T9 compared to CT of 07/23/2012.   Electronically Signed   By: Genevive Bi M.D.   On: 11/30/2014 19:10   Ct Abdomen Pelvis W Contrast  11/30/2014   CLINICAL DATA:  Right mid back pain. Concern pulmonary embolism. Urinary symptoms.  EXAM: CT ANGIOGRAPHY CHEST  CT ABDOMEN AND PELVIS WITH CONTRAST  TECHNIQUE: Multidetector CT imaging of the chest was performed using the standard protocol during bolus administration of intravenous contrast. Multiplanar CT image reconstructions and MIPs were obtained to evaluate the vascular anatomy. Multidetector CT imaging of the abdomen and pelvis was performed using the standard protocol during bolus administration of intravenous contrast.  CONTRAST:  OMNIPAQUE IOHEXOL 350 MG/ML SOLN  COMPARISON:  CT 07/23/2012  FINDINGS: CTA CHEST FINDINGS  Mediastinum/Nodes: No filling defects within the pulmonary arteries to suggest acute pulmonary embolism. Acute findings aorta great vessels. There  are intimal calcification of the aorta. There soft and calcified plaque within the aorta. No pericardial fluid. Esophagus is normal.  Lungs/Pleura: Centrilobular emphysema in the lung apices. No pulmonary edema. Mild linear scarring at the left lung base and right lung base.  Musculoskeletal: No aggressive osseous lesion.  CT ABDOMEN and PELVIS FINDINGS  Hepatobiliary: No biliary duct dilatation. Gallbladder is normal. No focal hepatic lesion. Common bile duct normal caliber.  Pancreas: Pancreas is normal. No ductal dilatation. No pancreatic inflammation.  Spleen: Normal spleen  Adrenals/urinary tract: There is a 23 mm lesion of the right adrenal gland which has washout characteristics characteristic of a benign adenoma. No hydronephrosis. No ureteral lithiasis. Normal bladder.  Stomach/Bowel: Stomach, small bowel, appendix, and cecum are normal. The colon and rectosigmoid colon are normal.  Vascular/Lymphatic: Abdominal aorta is normal caliber. There is no retroperitoneal or  periportal lymphadenopathy. No pelvic lymphadenopathy. Heavy calcification of the aorta.  Reproductive: Post hysterectomy anatomy.  Musculoskeletal: No aggressive osseous lesion. There is a compression fractures of the T6 and T9 vertebral bodies which are progressed from comparison exam.  Review of the MIP images confirms the above findings.  IMPRESSION: Chest Impression:  1. No acute pulmonary embolism. 2. No pneumonia or pleural fluid. Abdomen / Pelvis Impression:  1. No Acute findings the abdomen pelvis. 2. Benign right adrenal adenoma. 3. Atherosclerotic calcification aorta. 4. Progressive compression fractures at T6 and T9 compared to CT of 07/23/2012.   Electronically Signed   By: Genevive Bi M.D.   On: 11/30/2014 19:10    Microbiology: Recent Results (from the past 240 hour(s))  Urine culture     Status: None   Collection Time: 11/30/14  5:44 PM  Result Value Ref Range Status   Specimen Description URINE, CLEAN CATCH  Final   Special Requests NONE  Final   Colony Count   Final    50,000 COLONIES/ML Performed at [facility]    Culture   Final    Multiple bacterial morphotypes present, none predominant. Suggest appropriate recollection if clinically indicated. Performed at [facility]    Report Status 12/02/2014 FINAL  Final     Labs: Basic Metabolic Panel:  Recent Labs Lab 11/30/14 1641 12/01/14 0420 12/02/14 0355 12/03/14 0840 12/04/14 0420  NA 140 141 138 140 136  K 4.2 3.4* 3.1* 4.5 3.9  CL 104 106 104 107 105  CO2 GLUCOSE 120* 114* 108* 115* 107*  BUN 30* 26* 29* 23* 24*  CREATININE 0.76 0.66 0.79 0.78 0.75  CALCIUM 9.4 9.1 8.8* 8.8* 8.6*  MG  --   --   --  2.0  --    Liver Function Tests:  Recent Labs Lab 11/30/14 1641  AST 17  ALT 11*  ALKPHOS 104  BILITOT 0.8  PROT 8.1  ALBUMIN 4.6    Recent Labs Lab 11/30/14 1641  LIPASE 23   No results for input(s): AMMONIA in the last 168 hours. CBC:  Recent Labs Lab  11/30/14 1641 12/01/14 0420 12/02/14 0355  WBC 10.1 9.1 9.5  NEUTROABS 7.5  --   --   HGB 13.7 11.8* 12.5  HCT 42.7 37.5 40.5  MCV 86.3 85.4 86.4  PLT 317 300 307   Cardiac Enzymes: No results for input(s): CKTOTAL, CKMB, CKMBINDEX, TROPONINI in the last 168 hours. BNP: BNP (last 3 results) No results for input(s): BNP in the last 8760 hours.  ProBNP (last 3 results) No results for input(s): PROBNP in  the last 8760 hours.  CBG: No results for input(s): GLUCAP in the last 168 hours.     SignedRamiro Harvest:  Jeanni Allshouse MD [facility] 12/04/2014, 12:15 PM

## 2014-12-04 NOTE — Care Management Note (Signed)
Case Management Note  Patient Details  Name: Wendall StadeRuth Nicholes MRN: [id number] Date of Birth: 11/22/1921   Status of Service:  In process, will continue to follow  Medicare Important Message Given:  Yes Date Medicare IM Given:  12/04/14 Medicare IM give by:  Sandford CrazeNora Katrina Daddona RN,BSN,NCM Date Additional Medicare IM Given:    Additional Medicare Important Message give by:     If discussed at Long Length of Stay Meetings, dates discussed:    Additional Comments:  Bartholome BillCLEMENTS, Torey Regan H, RN 12/04/2014, 10:31 AM

## 2014-12-05 ENCOUNTER — Encounter: Payer: Self-pay | Admitting: Adult Health

## 2014-12-05 ENCOUNTER — Telehealth: Payer: Self-pay | Admitting: *Deleted

## 2014-12-05 ENCOUNTER — Non-Acute Institutional Stay (SKILLED_NURSING_FACILITY): Payer: Medicare HMO | Admitting: Adult Health

## 2014-12-05 DIAGNOSIS — I10 Essential (primary) hypertension: Secondary | ICD-10-CM | POA: Diagnosis not present

## 2014-12-05 DIAGNOSIS — K5900 Constipation, unspecified: Secondary | ICD-10-CM

## 2014-12-05 DIAGNOSIS — S22000S Wedge compression fracture of unspecified thoracic vertebra, sequela: Secondary | ICD-10-CM

## 2014-12-05 DIAGNOSIS — I2510 Atherosclerotic heart disease of native coronary artery without angina pectoris: Secondary | ICD-10-CM | POA: Diagnosis not present

## 2014-12-05 DIAGNOSIS — E785 Hyperlipidemia, unspecified: Secondary | ICD-10-CM

## 2014-12-05 NOTE — Telephone Encounter (Signed)
Pt was on tcm list d/c 12/04/14 [facility]Raechel Chute/LMB

## 2014-12-05 NOTE — Progress Notes (Signed)
Patient ID: Wendall StadeRuth Laverne, female   DOB: 03/18/1922, 81 y.o.   MRN: [id number]   12/05/2014  Facility:  Nursing Home Location:  [facility] Room Number: [id number] LEVEL OF CARE:  SNF (31)   Chief Complaint  Patient presents with  . Hospitalization Follow-up    Compression fracture of thoracic vertebrae, hypertension, hyperlipidemia, constipation and CAD    HISTORY OF PRESENT ILLNESS:  This is a 81 year old female who [facility]. She reported that she twisted her back while trying to get up from her bed. She reports having increased pain with more being and changing positions since her hip surgery. She takes oxycodone at home and was not any relief. CT showed progressive fractures of T6 and T9 compared to prior CT scan. She was place on MS Contin ER 15 mg BID and Dilaudid for breakthrough pain. Consultation with interventional radiology was done but patient refused vertebroplasty.  Patient has been admitted for a short-term rehabilitation.  PAST MEDICAL HISTORY:  Past Medical History  Diagnosis Date  . ARTHRITIS   . 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 CORONARY ARTERY DISEASE   . MACULAR DEGENERATION   . PERIPHERAL VASCULAR DISEASE   . Sebaceous cyst     L chin and R neck  . Subclavian steal syndrome   . HYPERTENSION   . HYPERLIPIDEMIA   . Cyst of breast 2007    cyst on breast  . Osteoporosis, post-menopausal 07/2012    T10 compression fx, R hip fx 04/02/14 after fall    CURRENT MEDICATIONS: Reviewed per MAR/see medication list  Allergies  Allergen Reactions  . Codeine     Unknown  . Penicillins   . Sulfonamide Derivatives     Unknown.      REVIEW OF SYSTEMS:  GENERAL: no change in appetite, no fatigue, no weight changes, no fever, chills or weakness RESPIRATORY: no cough, SOB, DOE, wheezing, hemoptysis CARDIAC: no chest pain, edema or palpitations GI: no  abdominal pain, diarrhea,  heart burn, nausea or vomiting, + constipation  PHYSICAL EXAMINATION  GENERAL: no acute distress, normal body habitus EYES: conjunctivae normal, sclerae normal, normal eye lids NECK: supple, trachea midline, no neck masses, no thyroid tenderness, no thyromegaly LYMPHATICS: no LAN in the neck, no supraclavicular LAN RESPIRATORY: breathing is even & unlabored, BS CTAB CARDIAC: RRR, no murmur,no extra heart sounds, no edema GI: abdomen soft, normal BS, no masses, no tenderness, no hepatomegaly, no splenomegaly EXTREMITIES: able to move X 4 extremities PSYCHIATRIC: the patient is alert & oriented to person, affect & behavior appropriate  LABS/RADIOLOGY: Labs reviewed: Basic Metabolic Panel:  Recent Labs  40/98/1103/10/15 0355 12/03/14 0840 12/04/14 0420  NA 138 140 136  K 3.1* 4.5 3.9  CL 104 107 105  CO2 28 29 26   GLUCOSE 108* 115* 107*  BUN 29* 23* 24*  CREATININE 0.79 0.78 0.75  CALCIUM 8.8* 8.8* 8.6*  MG  --  2.0  --    Liver Function Tests:  Recent Labs  11/30/14 1641  AST 17  ALT 11*  ALKPHOS 104  BILITOT 0.8  PROT 8.1  ALBUMIN 4.6    Recent Labs  11/30/14 1641  LIPASE 23   CBC:  Recent Labs  11/30/14 1641 12/01/14 0420 12/02/14 0355  WBC 10.1 9.1 9.5  NEUTROABS 7.5  --   --   HGB 13.7 11.8* 12.5  HCT 42.7 37.5  40.5  MCV 86.3 85.4 86.4  PLT 317 300 307    Dg Ribs Unilateral W/chest Right  11/30/2014   CLINICAL DATA:  Chronic right upper back pain no trauma  EXAM: RIGHT RIBS AND CHEST - 3+ VIEW  COMPARISON:  Numerous prior studies including 07/23/2012  FINDINGS: Mild cardiac enlargement. Pronounced uncoiling of the aorta. Lungs are clear except for mild scarring or atelectasis at the bases. Bony thorax is intact.  IMPRESSION: No acute finding   Electronically Signed   By: Esperanza Heiraymond  Rubner M.D.   On: 11/30/2014 17:41   Dg Thoracic Spine W/swimmers  11/27/2014   CLINICAL DATA:  Strained muscle at upper back while twisting. Upper  back pain. Initial encounter.  EXAM: THORACIC SPINE - 2 VIEW + SWIMMERS  COMPARISON:  CTA of the chest performed 07/23/2012  FINDINGS: There is no evidence of fracture or subluxation. Vertebral bodies demonstrate normal height and alignment. Mild vacuum phenomenon and endplate sclerotic change are noted along the upper lumbar spine. Mild degenerative change is noted at the lower cervical spine. Remaining intervertebral disc spaces are preserved.  The visualized portions of both lungs are clear. The mediastinum is unremarkable in appearance.  IMPRESSION: No evidence of fracture or subluxation along the thoracic spine.   Electronically Signed   By: Roanna RaiderJeffery  Chang M.D.   On: 11/27/2014 23:51   Ct Angio Chest Pe W/cm &/or Wo Cm  11/30/2014   CLINICAL DATA:  Right mid back pain. Concern pulmonary embolism. Urinary symptoms.  EXAM: CT ANGIOGRAPHY CHEST  CT ABDOMEN AND PELVIS WITH CONTRAST  TECHNIQUE: Multidetector CT imaging of the chest was performed using the standard protocol during bolus administration of intravenous contrast. Multiplanar CT image reconstructions and MIPs were obtained to evaluate the vascular anatomy. Multidetector CT imaging of the abdomen and pelvis was performed using the standard protocol during bolus administration of intravenous contrast.  CONTRAST:  100mL OMNIPAQUE IOHEXOL 350 MG/ML SOLN  COMPARISON:  CT 07/23/2012  FINDINGS: CTA CHEST FINDINGS  Mediastinum/Nodes: No filling defects within the pulmonary arteries to suggest acute pulmonary embolism. Acute findings aorta great vessels. There are intimal calcification of the aorta. There soft and calcified plaque within the aorta. No pericardial fluid. Esophagus is normal.  Lungs/Pleura: Centrilobular emphysema in the lung apices. No pulmonary edema. Mild linear scarring at the left lung base and right lung base.  Musculoskeletal: No aggressive osseous lesion.  CT ABDOMEN and PELVIS FINDINGS  Hepatobiliary: No biliary duct dilatation.  Gallbladder is normal. No focal hepatic lesion. Common bile duct normal caliber.  Pancreas: Pancreas is normal. No ductal dilatation. No pancreatic inflammation.  Spleen: Normal spleen  Adrenals/urinary tract: There is a 23 mm lesion of the right adrenal gland which has washout characteristics characteristic of a benign adenoma. No hydronephrosis. No ureteral lithiasis. Normal bladder.  Stomach/Bowel: Stomach, small bowel, appendix, and cecum are normal. The colon and rectosigmoid colon are normal.  Vascular/Lymphatic: Abdominal aorta is normal caliber. There is no retroperitoneal or periportal lymphadenopathy. No pelvic lymphadenopathy. Heavy calcification of the aorta.  Reproductive: Post hysterectomy anatomy.  Musculoskeletal: No aggressive osseous lesion. There is a compression fractures of the T6 and T9 vertebral bodies which are progressed from comparison exam.  Review of the MIP images confirms the above findings.  IMPRESSION: Chest Impression:  1. No acute pulmonary embolism. 2. No pneumonia or pleural fluid. Abdomen / Pelvis Impression:  1. No Acute findings the abdomen pelvis. 2. Benign right adrenal adenoma. 3. Atherosclerotic calcification aorta. 4. Progressive compression fractures at  T6 and T9 compared to CT of 07/23/2012.   Electronically Signed   By: Genevive Bi M.D.   On: 11/30/2014 19:10   Ct Abdomen Pelvis W Contrast  11/30/2014   CLINICAL DATA:  Right mid back pain. Concern pulmonary embolism. Urinary symptoms.  EXAM: CT ANGIOGRAPHY CHEST  CT ABDOMEN AND PELVIS WITH CONTRAST  TECHNIQUE: Multidetector CT imaging of the chest was performed using the standard protocol during bolus administration of intravenous contrast. Multiplanar CT image reconstructions and MIPs were obtained to evaluate the vascular anatomy. Multidetector CT imaging of the abdomen and pelvis was performed using the standard protocol during bolus administration of intravenous contrast.  CONTRAST:  OMNIPAQUE IOHEXOL  350 MG/ML SOLN  COMPARISON:  CT 07/23/2012  FINDINGS: CTA CHEST FINDINGS  Mediastinum/Nodes: No filling defects within the pulmonary arteries to suggest acute pulmonary embolism. Acute findings aorta great vessels. There are intimal calcification of the aorta. There soft and calcified plaque within the aorta. No pericardial fluid. Esophagus is normal.  Lungs/Pleura: Centrilobular emphysema in the lung apices. No pulmonary edema. Mild linear scarring at the left lung base and right lung base.  Musculoskeletal: No aggressive osseous lesion.  CT ABDOMEN and PELVIS FINDINGS  Hepatobiliary: No biliary duct dilatation. Gallbladder is normal. No focal hepatic lesion. Common bile duct normal caliber.  Pancreas: Pancreas is normal. No ductal dilatation. No pancreatic inflammation.  Spleen: Normal spleen  Adrenals/urinary tract: There is a 23 mm lesion of the right adrenal gland which has washout characteristics characteristic of a benign adenoma. No hydronephrosis. No ureteral lithiasis. Normal bladder.  Stomach/Bowel: Stomach, small bowel, appendix, and cecum are normal. The colon and rectosigmoid colon are normal.  Vascular/Lymphatic: Abdominal aorta is normal caliber. There is no retroperitoneal or periportal lymphadenopathy. No pelvic lymphadenopathy. Heavy calcification of the aorta.  Reproductive: Post hysterectomy anatomy.  Musculoskeletal: No aggressive osseous lesion. There is a compression fractures of the T6 and T9 vertebral bodies which are progressed from comparison exam.  Review of the MIP images confirms the above findings.  IMPRESSION: Chest Impression:  1. No acute pulmonary embolism. 2. No pneumonia or pleural fluid. Abdomen / Pelvis Impression:  1. No Acute findings the abdomen pelvis. 2. Benign right adrenal adenoma. 3. Atherosclerotic calcification aorta. 4. Progressive compression fractures at T6 and T9 compared to CT of 07/23/2012.   Electronically Signed   By: Genevive Bi M.D.   On: 11/30/2014  19:10    ASSESSMENT/PLAN:  Compression fracture of thoracic vertebra - causing her to have severe back pains; for rehabilitation; continue Tylenol 325 mg  3 tabs =975 mg PO TID then PRN, MS Contin ER 15 mg BID and Dilaudid 2 mg 1 tab PO Q 4 hours PRN Hypertension - well-controlled; continue Tenormin 25 mg + 50 mg PO Q D Hyperlipidemia - continue Lipitor 10 mg PO Q D CAD - stable; continue Plavix 75 mg PO Q D Constipation - discontinue Colace; start Senna-S 2 tabs PO BID   Goals of care:  Short-term rehabilitation   Labs/test ordered:  For BMP in 1 week  Spent 50 minutes in patient care.   [facility], NP [employer] [phone]

## 2014-12-10 ENCOUNTER — Non-Acute Institutional Stay (SKILLED_NURSING_FACILITY): Payer: Medicare HMO | Admitting: Internal Medicine

## 2014-12-10 DIAGNOSIS — I10 Essential (primary) hypertension: Secondary | ICD-10-CM | POA: Diagnosis not present

## 2014-12-10 DIAGNOSIS — S22000S Wedge compression fracture of unspecified thoracic vertebra, sequela: Secondary | ICD-10-CM

## 2014-12-10 DIAGNOSIS — K5900 Constipation, unspecified: Secondary | ICD-10-CM | POA: Diagnosis not present

## 2014-12-10 DIAGNOSIS — I2510 Atherosclerotic heart disease of native coronary artery without angina pectoris: Secondary | ICD-10-CM | POA: Diagnosis not present

## 2014-12-10 DIAGNOSIS — E785 Hyperlipidemia, unspecified: Secondary | ICD-10-CM | POA: Diagnosis not present

## 2014-12-10 NOTE — Progress Notes (Signed)
Patient ID: [name], female   DOB: 02/13/1922, 81 y.o.   MRN: [id number]      [facility]   PCP: Rene PaciValerie Leschber, MD  Code Status: DNR  Allergies  Allergen Reactions  . Codeine     Unknown  . Penicillins   . Sulfonamide Derivatives     Unknown.     Chief Complaint  Patient presents with  . New Admit To SNF     HPI:  81 year old patient is here for short term rehabilitation post hospital admission from with back pain. Imaging showed progressive fracture T6-9. Patient refused vertebroplasty. She was placed on pain management and rehabilitation was recommended. She is seen in her room today. She feels weak and tired and complaints of chest pain with deep breath. Denies any other concerns.  Review of Systems:  Constitutional: Negative for fever, chills, diaphoresis.  HENT: Negative for headache, congestion, nasal discharge Eyes: Negative for eye pain, blurred vision, double vision and discharge.  Respiratory: Negative for cough, shortness of breath and wheezing.   Cardiovascular: Negative for chest pain, palpitations, leg swelling.  Gastrointestinal: Negative for heartburn, nausea, vomiting, abdominal pain Genitourinary: Negative for dysuria  Musculoskeletal: Negative for falls. Using walker Skin: Negative for itching, sores and rash.  Neurological: Negative for dizziness, tingling, focal weakness Psychiatric/Behavioral: Negative for depression   Past Medical History  Diagnosis Date  . ARTHRITIS   . 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 CORONARY ARTERY DISEASE   . MACULAR DEGENERATION   . PERIPHERAL VASCULAR DISEASE   . Sebaceous cyst     L chin and R neck  . Subclavian steal syndrome   . HYPERTENSION   . HYPERLIPIDEMIA   . Cyst of breast 2007    cyst on breast  . Osteoporosis, post-menopausal 07/2012    T10 compression fx, R hip fx 04/02/14 after fall   Past Surgical History  Procedure Laterality Date    . Tonsillectomy    . Sabacious cyst removed from breast  2011  . Breast surgery  2011    sebacious cyst removal   . Hip pinning Right 04/2014   Social History:   reports that she quit smoking about 5 years ago. She has never used smokeless tobacco. She reports that she drinks alcohol. She reports that she does not use illicit drugs.  Family History  Problem Relation Age of Onset  . Heart disease Other     Parent  . Miscarriages / Stillbirths Other   . Heart attack Father   . Cancer Mother   . Hyperlipidemia Brother   . Hypertension Brother   . Stroke Brother     Medications: Patient's Medications  New Prescriptions   No medications on file  Previous Medications   ACETAMINOPHEN (TYLENOL) 325 MG TABLET    Take 2 tablets (650 mg total) by mouth 3 (three) times daily. Take for 5 days scheduled, then every 4 hours as needed.   ATENOLOL (TENORMIN) 25 MG TABLET    Take 1 tablet (25 mg total) by mouth daily. Take with 50mg  tab for total 75mg  daily   ATENOLOL (TENORMIN) 50 MG TABLET    Take 1 tablet (50 mg total) by mouth daily. Take with 25mg  tab for total 75mg  daily   CALCITONIN, SALMON, (MIACALCIN/FORTICAL) 200 UNIT/ACT NASAL SPRAY    Place 1 spray into alternate nostrils daily.   CLOPIDOGREL (PLAVIX) 75 MG TABLET    TAKE 1 TABLET (75  MG TOTAL) BY MOUTH DAILY.   DOCUSATE SODIUM (COLACE) 100 MG CAPSULE    Take 1 capsule (100 mg total) by mouth 2 (two) times daily.   HYDROMORPHONE (DILAUDID) 2 MG TABLET    Take 1 tablet (2 mg total) by mouth every 4 (four) hours as needed for moderate pain or severe pain.   MORPHINE (MS CONTIN) 15 MG 12 HR TABLET    Take 1 tablet (15 mg total) by mouth every 12 (twelve) hours.   PRAVASTATIN (PRAVACHOL) 20 MG TABLET    TAKE 1 TABLET (20 MG TOTAL) BY MOUTH DAILY.  Modified Medications   No medications on file  Discontinued Medications   No medications on file     Physical Exam: Filed Vitals:   12/10/14 0940  BP: 132/82  Pulse: 74  Temp: 99 F  (37.2 C)  Resp: 18  Weight: 112 lb 9.6 oz (51.075 kg)  SpO2: 100%    General- elderly female, frail, in no acute distress Head- normocephalic, atraumatic Throat- moist mucus membrane, poor dentition Eyes- PERRLA, EOMI, no pallor, no icterus Neck- no cervical lymphadenopathy Cardiovascular- normal s1,s2, no murmurs, palpable dorsalis pedis, no leg edema Respiratory- bilateral clear to auscultation, no wheeze, no rhonchi, no crackles, no use of accessory muscles Abdomen- bowel sounds present, soft, non tender Musculoskeletal- able to move all 4 extremities, generalized weakness. Thoracic spine and paraspinal tenderness present, no reporducible chest pain Neurological- no focal deficit Skin- warm and dry Psychiatry- alert and oriented     Labs reviewed: Basic Metabolic Panel:  Recent Labs  16/10/96 0355 12/03/14 0840 12/04/14 0420  NA 138 140 136  K 3.1* 4.5 3.9  CL 104 107 105  CO2 GLUCOSE 108* 115* 107*  BUN 29* 23* 24*  CREATININE 0.79 0.78 0.75  CALCIUM 8.8* 8.8* 8.6*  MG  --  2.0  --    Liver Function Tests:  Recent Labs  11/30/14 1641  AST 17  ALT 11*  ALKPHOS 104  BILITOT 0.8  PROT 8.1  ALBUMIN 4.6    Recent Labs  11/30/14 1641  LIPASE 23   No results for input(s): AMMONIA in the last 8760 hours. CBC:  Recent Labs  11/30/14 1641 12/01/14 0420 12/02/14 0355  WBC 10.1 9.1 9.5  NEUTROABS 7.5  --   --   HGB 13.7 11.8* 12.5  HCT 42.7 37.5 40.5  MCV 86.3 85.4 86.4  PLT 317 300 307    Radiological Exams:  Dg Ribs Unilateral W/chest Right  11/30/2014   CLINICAL DATA:  Chronic right upper back pain no trauma  EXAM: RIGHT RIBS AND CHEST - 3+ VIEW  COMPARISON:  Numerous prior studies including 07/23/2012  FINDINGS: Mild cardiac enlargement. Pronounced uncoiling of the aorta. Lungs are clear except for mild scarring or atelectasis at the bases. Bony thorax is intact.  IMPRESSION: No acute finding   Electronically Signed   By: Esperanza Heir M.D.   On: 11/30/2014 17:41   Dg Thoracic Spine W/swimmers  11/27/2014   CLINICAL DATA:  Strained muscle at upper back while twisting. Upper back pain. Initial encounter.  EXAM: THORACIC SPINE - 2 VIEW + SWIMMERS  COMPARISON:  CTA of the chest performed 07/23/2012  FINDINGS: There is no evidence of fracture or subluxation. Vertebral bodies demonstrate normal height and alignment. Mild vacuum phenomenon and endplate sclerotic change are noted along the upper lumbar spine. Mild degenerative change is noted at the lower cervical spine. Remaining intervertebral disc spaces are preserved.  The visualized portions of both lungs are clear. The mediastinum is unremarkable in appearance.  IMPRESSION: No evidence of fracture or subluxation along the thoracic spine.   Electronically Signed   By: Roanna RaiderJeffery  Chang M.D.   On: 11/27/2014 23:51   Ct Angio Chest Pe W/cm &/or Wo Cm  11/30/2014   CLINICAL DATA:  Right mid back pain. Concern pulmonary embolism. Urinary symptoms.  EXAM: CT ANGIOGRAPHY CHEST  CT ABDOMEN AND PELVIS WITH CONTRAST  TECHNIQUE: Multidetector CT imaging of the chest was performed using the standard protocol during bolus administration of intravenous contrast. Multiplanar CT image reconstructions and MIPs were obtained to evaluate the vascular anatomy. Multidetector CT imaging of the abdomen and pelvis was performed using the standard protocol during bolus administration of intravenous contrast.  CONTRAST:  100mL OMNIPAQUE IOHEXOL 350 MG/ML SOLN  COMPARISON:  CT 07/23/2012  FINDINGS: CTA CHEST FINDINGS  Mediastinum/Nodes: No filling defects within the pulmonary arteries to suggest acute pulmonary embolism. Acute findings aorta great vessels. There are intimal calcification of the aorta. There soft and calcified plaque within the aorta. No pericardial fluid. Esophagus is normal.  Lungs/Pleura: Centrilobular emphysema in the lung apices. No pulmonary edema. Mild linear scarring at the left lung base and  right lung base.  Musculoskeletal: No aggressive osseous lesion.  CT ABDOMEN and PELVIS FINDINGS  Hepatobiliary: No biliary duct dilatation. Gallbladder is normal. No focal hepatic lesion. Common bile duct normal caliber.  Pancreas: Pancreas is normal. No ductal dilatation. No pancreatic inflammation.  Spleen: Normal spleen  Adrenals/urinary tract: There is a 23 mm lesion of the right adrenal gland which has washout characteristics characteristic of a benign adenoma. No hydronephrosis. No ureteral lithiasis. Normal bladder.  Stomach/Bowel: Stomach, small bowel, appendix, and cecum are normal. The colon and rectosigmoid colon are normal.  Vascular/Lymphatic: Abdominal aorta is normal caliber. There is no retroperitoneal or periportal lymphadenopathy. No pelvic lymphadenopathy. Heavy calcification of the aorta.  Reproductive: Post hysterectomy anatomy.  Musculoskeletal: No aggressive osseous lesion. There is a compression fractures of the T6 and T9 vertebral bodies which are progressed from comparison exam.  Review of the MIP images confirms the above findings.  IMPRESSION: Chest Impression:  1. No acute pulmonary embolism. 2. No pneumonia or pleural fluid. Abdomen / Pelvis Impression:  1. No Acute findings the abdomen pelvis. 2. Benign right adrenal adenoma. 3. Atherosclerotic calcification aorta. 4. Progressive compression fractures at T6 and T9 compared to CT of 07/23/2012.   Electronically Signed   By: Genevive BiStewart  Edmunds M.D.   On: 11/30/2014 19:10   Ct Abdomen Pelvis W Contrast  11/30/2014   CLINICAL DATA:  Right mid back pain. Concern pulmonary embolism. Urinary symptoms.  EXAM: CT ANGIOGRAPHY CHEST  CT ABDOMEN AND PELVIS WITH CONTRAST  TECHNIQUE: Multidetector CT imaging of the chest was performed using the standard protocol during bolus administration of intravenous contrast. Multiplanar CT image reconstructions and MIPs were obtained to evaluate the vascular anatomy. Multidetector CT imaging of the abdomen  and pelvis was performed using the standard protocol during bolus administration of intravenous contrast.  CONTRAST:  100mL OMNIPAQUE IOHEXOL 350 MG/ML SOLN  COMPARISON:  CT 07/23/2012  FINDINGS: CTA CHEST FINDINGS  Mediastinum/Nodes: No filling defects within the pulmonary arteries to suggest acute pulmonary embolism. Acute findings aorta great vessels. There are intimal calcification of the aorta. There soft and calcified plaque within the aorta. No pericardial fluid. Esophagus is normal.  Lungs/Pleura: Centrilobular emphysema in the lung apices. No pulmonary edema. Mild linear scarring at the left  lung base and right lung base.  Musculoskeletal: No aggressive osseous lesion.  CT ABDOMEN and PELVIS FINDINGS  Hepatobiliary: No biliary duct dilatation. Gallbladder is normal. No focal hepatic lesion. Common bile duct normal caliber.  Pancreas: Pancreas is normal. No ductal dilatation. No pancreatic inflammation.  Spleen: Normal spleen  Adrenals/urinary tract: There is a 23 mm lesion of the right adrenal gland which has washout characteristics characteristic of a benign adenoma. No hydronephrosis. No ureteral lithiasis. Normal bladder.  Stomach/Bowel: Stomach, small bowel, appendix, and cecum are normal. The colon and rectosigmoid colon are normal.  Vascular/Lymphatic: Abdominal aorta is normal caliber. There is no retroperitoneal or periportal lymphadenopathy. No pelvic lymphadenopathy. Heavy calcification of the aorta.  Reproductive: Post hysterectomy anatomy.  Musculoskeletal: No aggressive osseous lesion. There is a compression fractures of the T6 and T9 vertebral bodies which are progressed from comparison exam.  Review of the MIP images confirms the above findings.  IMPRESSION: Chest Impression:  1. No acute pulmonary embolism. 2. No pneumonia or pleural fluid. Abdomen / Pelvis Impression:  1. No Acute findings the abdomen pelvis. 2. Benign right adrenal adenoma. 3. Atherosclerotic calcification aorta. 4.  Progressive compression fractures at T6 and T9 compared to CT of 07/23/2012.   Electronically Signed   By: Genevive Bi M.D.   On: 11/30/2014 19:10    Assessment/Plan  Compression fracture of thoracic vertebra No surgery performed as patient refused. continue ms contin ER 15 mg tid with dilaudid 2 mg q4h prn and Tylenol 975 mg PO TID PRN pain. Continue robaxin 500 mg q6h prn muscle spasm. These were recent changes. Back precautions. Will have patient work with PT/OT as tolerated to regain strength and restore function.  Fall precautions are in place.  Hypertension Stable, monitor bp, continue Tenormin 75 mg daily  CAD Remains chest pain free. Continue plavix and tenormin for now with lipitor  Hyperlipidemia  continue Lipitor 10 mg daily  Constipation continue Senna-S 2 tabs PO BID   Goals of care: short term rehabilitation    Family/ staff Communication: reviewed care plan with patient and nursing supervisor    Oneal Grout, MD  [facility] [phone] (Monday-Friday 8 am - 5 pm) [phone] (afterhours)

## 2014-12-22 ENCOUNTER — Non-Acute Institutional Stay (SKILLED_NURSING_FACILITY): Payer: Medicare HMO | Admitting: Adult Health

## 2014-12-22 ENCOUNTER — Encounter: Payer: Self-pay | Admitting: Adult Health

## 2014-12-22 DIAGNOSIS — S22000S Wedge compression fracture of unspecified thoracic vertebra, sequela: Secondary | ICD-10-CM

## 2014-12-22 DIAGNOSIS — K5900 Constipation, unspecified: Secondary | ICD-10-CM | POA: Diagnosis not present

## 2014-12-22 DIAGNOSIS — I10 Essential (primary) hypertension: Secondary | ICD-10-CM | POA: Diagnosis not present

## 2014-12-22 DIAGNOSIS — I2510 Atherosclerotic heart disease of native coronary artery without angina pectoris: Secondary | ICD-10-CM | POA: Diagnosis not present

## 2014-12-22 DIAGNOSIS — E785 Hyperlipidemia, unspecified: Secondary | ICD-10-CM

## 2014-12-22 NOTE — Progress Notes (Signed)
Patient ID: [name], female   DOB: [birth year], 81 y.o.   MRN: [id number]   12/22/2014  Facility:  Nursing Home Location:  [facility] Room Number: 602-P LEVEL OF CARE:  SNF (31)   Chief Complaint  Patient presents with  . Discharge Note    Compression fracture of thoracic vertebrae, hypertension, hyperlipidemia, constipation and CAD    HISTORY OF PRESENT ILLNESS:  This is a 81 year old female who is for discharge home with Home health OT for ADLs, PT for endurance and CNA for showers. She has been admitted to [facility] on 12/04/14 from [facility]. She reported that she twisted her back while trying to get up from her bed. She reports having increased pain with more being and changing positions since her hip surgery. She takes oxycodone at home and was not any relief. CT showed progressive fractures of T6 and T9 compared to prior CT scan. She was place on MS Contin ER 15 mg BID and Dilaudid for breakthrough pain. Consultation with interventional radiology was done but patient refused vertebroplasty. Her MS Contin has been increased to 15 mg PO Q 8 hours.  Patient was admitted to this facility for short-term rehabilitation after the patient's recent hospitalization.  Patient has completed SNF rehabilitation and therapy has cleared the patient for discharge.  PAST MEDICAL HISTORY:  Past Medical History  Diagnosis Date  . ARTHRITIS   . 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 CORONARY ARTERY DISEASE   . MACULAR DEGENERATION   . PERIPHERAL VASCULAR DISEASE   . Sebaceous cyst     L chin and R neck  . Subclavian steal syndrome   . HYPERTENSION   . HYPERLIPIDEMIA   . Cyst of breast 2007    cyst on breast  . Osteoporosis, post-menopausal 07/2012    T10 compression fx, R hip fx 04/02/14 after fall    CURRENT MEDICATIONS: Reviewed per MAR/see medication list  Allergies  Allergen Reactions  . Codeine     Unknown  .  Penicillins   . Sulfonamide Derivatives     Unknown.      REVIEW OF SYSTEMS:  GENERAL: no change in appetite, no fatigue, no weight changes, no fever, chills or weakness RESPIRATORY: no cough, SOB, DOE, wheezing, hemoptysis CARDIAC: no chest pain, edema or palpitations GI: no abdominal pain, diarrhea,  heart burn, nausea or vomiting  PHYSICAL EXAMINATION  GENERAL: no acute distress, normal body habitus NECK: supple, trachea midline, no neck masses, no thyroid tenderness, no thyromegaly LYMPHATICS: no LAN in the neck, no supraclavicular LAN RESPIRATORY: breathing is even & unlabored, BS CTAB CARDIAC: RRR, no murmur,no extra heart sounds, no edema GI: abdomen soft, normal BS, no masses, no tenderness, no hepatomegaly, no splenomegaly EXTREMITIES: able to move X 4 extremities PSYCHIATRIC: the patient is alert & oriented to person, affect & behavior appropriate  LABS/RADIOLOGY: Labs reviewed: 12/11/14  sodium 139 potassium 4.2 glucose 90 BUN 18 creatinine 0.70 calcium 8.9 12/09/14  WBC 7.4 hemoglobin 11.2 hematocrit 33.9 MCV 82.7 platelet 325 Basic Metabolic Panel:  Recent Labs  08/65/78 0355 12/03/14 0840 12/04/14 0420  NA 138 140 136  K 3.1* 4.5 3.9  CL 104 107 105  CO2 GLUCOSE 108* 115* 107*  BUN 29* 23* 24*  CREATININE 0.79 0.78 0.75  CALCIUM 8.8* 8.8* 8.6*  MG  --  2.0  --    Liver Function Tests:  Recent Labs  11/30/14 1641  AST 17  ALT 11*  ALKPHOS 104  BILITOT 0.8  PROT 8.1  ALBUMIN 4.6    Recent Labs  11/30/14 1641  LIPASE 23   CBC:  Recent Labs  11/30/14 1641 12/01/14 0420 12/02/14 0355  WBC 10.1 9.1 9.5  NEUTROABS 7.5  --   --   HGB 13.7 11.8* 12.5  HCT 42.7 37.5 40.5  MCV 86.3 85.4 86.4  PLT 317 300 307    Dg Ribs Unilateral W/chest Right  11/30/2014   CLINICAL DATA:  Chronic right upper back pain no trauma  EXAM: RIGHT RIBS AND CHEST - 3+ VIEW  COMPARISON:  Numerous prior studies including 07/23/2012  FINDINGS: Mild  cardiac enlargement. Pronounced uncoiling of the aorta. Lungs are clear except for mild scarring or atelectasis at the bases. Bony thorax is intact.  IMPRESSION: No acute finding   Electronically Signed   By: Esperanza Heir M.D.   On: 11/30/2014 17:41   Dg Thoracic Spine W/swimmers  11/27/2014   CLINICAL DATA:  Strained muscle at upper back while twisting. Upper back pain. Initial encounter.  EXAM: THORACIC SPINE - 2 VIEW + SWIMMERS  COMPARISON:  CTA of the chest performed 07/23/2012  FINDINGS: There is no evidence of fracture or subluxation. Vertebral bodies demonstrate normal height and alignment. Mild vacuum phenomenon and endplate sclerotic change are noted along the upper lumbar spine. Mild degenerative change is noted at the lower cervical spine. Remaining intervertebral disc spaces are preserved.  The visualized portions of both lungs are clear. The mediastinum is unremarkable in appearance.  IMPRESSION: No evidence of fracture or subluxation along the thoracic spine.   Electronically Signed   By: Roanna Raider M.D.   On: 11/27/2014 23:51   Ct Angio Chest Pe W/cm &/or Wo Cm  11/30/2014   CLINICAL DATA:  Right mid back pain. Concern pulmonary embolism. Urinary symptoms.  EXAM: CT ANGIOGRAPHY CHEST  CT ABDOMEN AND PELVIS WITH CONTRAST  TECHNIQUE: Multidetector CT imaging of the chest was performed using the standard protocol during bolus administration of intravenous contrast. Multiplanar CT image reconstructions and MIPs were obtained to evaluate the vascular anatomy. Multidetector CT imaging of the abdomen and pelvis was performed using the standard protocol during bolus administration of intravenous contrast.  CONTRAST:  OMNIPAQUE IOHEXOL 350 MG/ML SOLN  COMPARISON:  CT 07/23/2012  FINDINGS: CTA CHEST FINDINGS  Mediastinum/Nodes: No filling defects within the pulmonary arteries to suggest acute pulmonary embolism. Acute findings aorta great vessels. There are intimal calcification of the aorta.  There soft and calcified plaque within the aorta. No pericardial fluid. Esophagus is normal.  Lungs/Pleura: Centrilobular emphysema in the lung apices. No pulmonary edema. Mild linear scarring at the left lung base and right lung base.  Musculoskeletal: No aggressive osseous lesion.  CT ABDOMEN and PELVIS FINDINGS  Hepatobiliary: No biliary duct dilatation. Gallbladder is normal. No focal hepatic lesion. Common bile duct normal caliber.  Pancreas: Pancreas is normal. No ductal dilatation. No pancreatic inflammation.  Spleen: Normal spleen  Adrenals/urinary tract: There is a 23 mm lesion of the right adrenal gland which has washout characteristics characteristic of a benign adenoma. No hydronephrosis. No ureteral lithiasis. Normal bladder.  Stomach/Bowel: Stomach, small bowel, appendix, and cecum are normal. The colon and rectosigmoid colon are normal.  Vascular/Lymphatic: Abdominal aorta is normal caliber. There is no retroperitoneal or periportal lymphadenopathy. No pelvic lymphadenopathy. Heavy calcification of the aorta.  Reproductive: Post hysterectomy anatomy.  Musculoskeletal: No aggressive osseous lesion. There is a  compression fractures of the T6 and T9 vertebral bodies which are progressed from comparison exam.  Review of the MIP images confirms the above findings.  IMPRESSION: Chest Impression:  1. No acute pulmonary embolism. 2. No pneumonia or pleural fluid. Abdomen / Pelvis Impression:  1. No Acute findings the abdomen pelvis. 2. Benign right adrenal adenoma. 3. Atherosclerotic calcification aorta. 4. Progressive compression fractures at T6 and T9 compared to CT of 07/23/2012.   Electronically Signed   By: Genevive Bi M.D.   On: 11/30/2014 19:10   Ct Abdomen Pelvis W Contrast  11/30/2014   CLINICAL DATA:  Right mid back pain. Concern pulmonary embolism. Urinary symptoms.  EXAM: CT ANGIOGRAPHY CHEST  CT ABDOMEN AND PELVIS WITH CONTRAST  TECHNIQUE: Multidetector CT imaging of the chest was  performed using the standard protocol during bolus administration of intravenous contrast. Multiplanar CT image reconstructions and MIPs were obtained to evaluate the vascular anatomy. Multidetector CT imaging of the abdomen and pelvis was performed using the standard protocol during bolus administration of intravenous contrast.  CONTRAST:  OMNIPAQUE IOHEXOL 350 MG/ML SOLN  COMPARISON:  CT 07/23/2012  FINDINGS: CTA CHEST FINDINGS  Mediastinum/Nodes: No filling defects within the pulmonary arteries to suggest acute pulmonary embolism. Acute findings aorta great vessels. There are intimal calcification of the aorta. There soft and calcified plaque within the aorta. No pericardial fluid. Esophagus is normal.  Lungs/Pleura: Centrilobular emphysema in the lung apices. No pulmonary edema. Mild linear scarring at the left lung base and right lung base.  Musculoskeletal: No aggressive osseous lesion.  CT ABDOMEN and PELVIS FINDINGS  Hepatobiliary: No biliary duct dilatation. Gallbladder is normal. No focal hepatic lesion. Common bile duct normal caliber.  Pancreas: Pancreas is normal. No ductal dilatation. No pancreatic inflammation.  Spleen: Normal spleen  Adrenals/urinary tract: There is a 23 mm lesion of the right adrenal gland which has washout characteristics characteristic of a benign adenoma. No hydronephrosis. No ureteral lithiasis. Normal bladder.  Stomach/Bowel: Stomach, small bowel, appendix, and cecum are normal. The colon and rectosigmoid colon are normal.  Vascular/Lymphatic: Abdominal aorta is normal caliber. There is no retroperitoneal or periportal lymphadenopathy. No pelvic lymphadenopathy. Heavy calcification of the aorta.  Reproductive: Post hysterectomy anatomy.  Musculoskeletal: No aggressive osseous lesion. There is a compression fractures of the T6 and T9 vertebral bodies which are progressed from comparison exam.  Review of the MIP images confirms the above findings.  IMPRESSION: Chest  Impression:  1. No acute pulmonary embolism. 2. No pneumonia or pleural fluid. Abdomen / Pelvis Impression:  1. No Acute findings the abdomen pelvis. 2. Benign right adrenal adenoma. 3. Atherosclerotic calcification aorta. 4. Progressive compression fractures at T6 and T9 compared to CT of 07/23/2012.   Electronically Signed   By: Genevive Bi M.D.   On: 11/30/2014 19:10    ASSESSMENT/PLAN:  Compression fracture of thoracic vertebra - causing her to have severe back pains; for Home health PT, OT and CNA; continue Tylenol 325 mg  3 tabs =975 mg PO TID then PRN, MS Contin ER 15 mg Q 8 hours and Dilaudid 2 mg 1 tab PO Q 4 hours PRN Hypertension - well-controlled; continue Tenormin 25 mg + 50 mg PO Q D Hyperlipidemia - continue Lipitor 10 mg PO Q D CAD - stable; continue Plavix 75 mg PO Q D Constipation - continue Senna-S 2 tabs PO BID    I have filled out patient's discharge paperwork and written prescriptions.  Patient will receive home health PT, OT  and CNA.  Total discharge time: Less than 30 minutes  Discharge time involved coordination of the discharge process with Child psychotherapistsocial worker, nursing staff and therapy department. Medical justification for home health services verified.    [facility] CenterMEDINA-VARGAS,MONINA, NP [facility] [phone]

## 2015-01-05 ENCOUNTER — Telehealth: Payer: Self-pay | Admitting: Internal Medicine

## 2015-01-05 NOTE — Telephone Encounter (Signed)
Did evaluate for home health physical therapy was set up for one time a week for one week and two times a week for six weeks.  Would like verbal approval.

## 2015-01-05 NOTE — Telephone Encounter (Signed)
Called and gave verbal approval for PT for pt.

## 2015-01-08 ENCOUNTER — Telehealth: Payer: Self-pay | Admitting: Internal Medicine

## 2015-01-08 NOTE — Telephone Encounter (Signed)
Ron Parker from [facility] needs verbal authorization for OT. He evaluated her today and he wants to see her 2x for 2weeks and 1x  for 1 week. His number is [phone]

## 2015-01-12 NOTE — Telephone Encounter (Signed)
Called [name] with [facility].   LMOM with verbal okay for OT 2xweek for 2 weeks and 1xweek for 1 week.

## 2015-02-04 ENCOUNTER — Other Ambulatory Visit (INDEPENDENT_AMBULATORY_CARE_PROVIDER_SITE_OTHER): Payer: Medicare HMO

## 2015-02-04 ENCOUNTER — Ambulatory Visit (INDEPENDENT_AMBULATORY_CARE_PROVIDER_SITE_OTHER): Payer: Medicare HMO | Admitting: Internal Medicine

## 2015-02-04 ENCOUNTER — Encounter: Payer: Self-pay | Admitting: Internal Medicine

## 2015-02-04 VITALS — BP 122/82 | HR 62 | Temp 97.9°F | Ht 62.0 in | Wt 107.1 lb

## 2015-02-04 DIAGNOSIS — E785 Hyperlipidemia, unspecified: Secondary | ICD-10-CM | POA: Diagnosis not present

## 2015-02-04 DIAGNOSIS — I10 Essential (primary) hypertension: Secondary | ICD-10-CM

## 2015-02-04 DIAGNOSIS — Z0000 Encounter for general adult medical examination without abnormal findings: Secondary | ICD-10-CM

## 2015-02-04 DIAGNOSIS — M79604 Pain in right leg: Secondary | ICD-10-CM | POA: Diagnosis not present

## 2015-02-04 DIAGNOSIS — M8080XS Other osteoporosis with current pathological fracture, unspecified site, sequela: Secondary | ICD-10-CM

## 2015-02-04 LAB — LIPID PANEL
CHOLESTEROL: 184 mg/dL (ref 0–200)
HDL: 48.6 mg/dL (ref >39.00)
LDL Cholesterol: 111 mg/dL — ABNORMAL HIGH (ref 0–99)
NonHDL: 135.4
TRIGLYCERIDES: 123 mg/dL (ref 0.0–149.0)
Total CHOL/HDL Ratio: 4
VLDL: 24.6 mg/dL (ref 0.0–40.0)

## 2015-02-04 LAB — TSH: TSH: 2 u[IU]/mL (ref 0.35–4.50)

## 2015-02-04 MED ORDER — CELECOXIB 200 MG PO CAPS: 200.0000 mg | ORAL_CAPSULE | Freq: Every day | ORAL | Status: DC

## 2015-02-04 MED ORDER — CLOPIDOGREL BISULFATE 75 MG PO TABS: 75.0000 mg | ORAL_TABLET | Freq: Every day | ORAL | Status: DC

## 2015-02-04 MED ORDER — PRAVASTATIN SODIUM 20 MG PO TABS: 20.0000 mg | ORAL_TABLET | Freq: Every day | ORAL | Status: DC

## 2015-02-04 NOTE — Patient Instructions (Signed)
It was good to see you today.  We have reviewed your prior records including labs and tests today  Test(s) ordered today. Your results will be released to MyChart (or called to you) after review, usually within 72hours after test completion. If any changes need to be made, you will be notified at that same time.  Medications reviewed and updated, Start celebrex for pain daily - or as needed No other changes recommended at this time. Refill on medication(s) as discussed today.  follow up with Dr Charlann Boxerlin and Bean as planned for right leg pain  Please schedule followup in 4-6 weeks to review meds, call sooner if problems.

## 2015-02-04 NOTE — Progress Notes (Signed)
Subjective:    Patient ID: [name], female    DOB: 1922/05/08, 81 y.o.   MRN: [id number]  HPI   Here for medicare wellness  Diet: heart healthy Physical activity: sedentary Depression/mood screen: negative Hearing: intact to whispered voice Visual acuity: grossly normal, performs annual eye exam  ADLs: capable Fall risk: none Home safety: good Cognitive evaluation: intact to orientation, naming, recall and repetition EOL planning: adv directives, full code/ I agree  I have personally reviewed and have noted 1. The patient's medical and social history 2. Their use of alcohol, tobacco or illicit drugs 3. Their current medications and supplements 4. The patient's functional ability including ADL's, fall risks, home safety risks and hearing or visual impairment. 5. Diet and physical activities 6. Evidence for depression or mood disorders  Also reviewed chrnic med conditions, interval history and current concerns   Past Medical History  Diagnosis Date  . ARTHRITIS   . 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 CORONARY ARTERY DISEASE   . MACULAR DEGENERATION   . PERIPHERAL VASCULAR DISEASE   . Sebaceous cyst     L chin and R neck  . Subclavian steal syndrome   . HYPERTENSION   . HYPERLIPIDEMIA   . Cyst of breast 2007    cyst on breast  . Osteoporosis, post-menopausal 07/2012    T10 compression fx, R hip fx 04/02/14 after fall   Family History  Problem Relation Age of Onset  . Heart disease Other     Parent  . Miscarriages / Stillbirths Other   . Heart attack Father   . Cancer Mother   . Hyperlipidemia Brother   . Hypertension Brother   . Stroke Brother    History  Substance Use Topics  . Smoking status: Former Smoker -- 1.00 packs/day for 61 years    Quit date: 09/29/2009  . Smokeless tobacco: Never Used  . Alcohol Use: 0.0 oz/week    0 Standard drinks or equivalent per week     Comment: Occasional    Review of Systems    Constitutional: Positive for fever.  Respiratory: Negative for cough and shortness of breath.   Cardiovascular: Negative for chest pain and leg swelling.  Musculoskeletal: Positive for myalgias, arthralgias and gait problem (RLE pain - knee and foot following R hip fx and pin repair 04/2014). Back pain: controlled.  Psychiatric/Behavioral: Negative for dysphoric mood and decreased concentration.  Patient Care Team: Newt Lukes, MD as PCP - General Chuck Hint, MD (Vascular Surgery) Durene Romans, MD (Orthopedic Surgery)      Objective:    Physical Exam  Constitutional: She is oriented to person, place, and time. She appears well-developed and well-nourished. No distress.  dtr at side; pt uses RW for ambulation assist  Cardiovascular: Normal rate, regular rhythm and normal heart sounds.   No murmur heard. Pulmonary/Chest: Effort normal and breath sounds normal. No respiratory distress.  Musculoskeletal: She exhibits no edema.  Neurological: She is alert and oriented to person, place, and time. No cranial nerve deficit.    BP 122/82 mmHg  Pulse 62  Temp(Src) 97.9 F (36.6 C) (Oral)  Ht 5\' 2"  (1.575 m)  Wt 107 lb 2 oz (48.592 kg)  BMI 19.59 kg/m2  SpO2 95% Wt Readings from Last 3 Encounters:  02/04/15 107 lb 2 oz (48.592 kg)  12/22/14 110 lb 6.4 oz (50.077 kg)  12/10/14 112 lb 9.6 oz (51.075 kg)    Lab Results  Component Value Date   WBC 9.5 12/02/2014   HGB 12.5 12/02/2014   HCT 40.5 12/02/2014   PLT 307 12/02/2014   GLUCOSE 107* 12/04/2014   CHOL 173 07/10/2013   TRIG 142.0 07/10/2013   HDL 52.70 07/10/2013   LDLCALC 92 07/10/2013   ALT 11* 11/30/2014   AST 17 11/30/2014   NA 136 12/04/2014   K 3.9 12/04/2014   CL 105 12/04/2014   CREATININE 0.75 12/04/2014   BUN 24* 12/04/2014   CO2 26 12/04/2014   TSH 3.17 07/10/2013   INR 0.96 06/28/2010   HGBA1C 6.4 01/08/2014    Dg Ribs Unilateral W/chest Right  11/30/2014   CLINICAL DATA:  Chronic  right upper back pain no trauma  EXAM: RIGHT RIBS AND CHEST - 3+ VIEW  COMPARISON:  Numerous prior studies including 07/23/2012  FINDINGS: Mild cardiac enlargement. Pronounced uncoiling of the aorta. Lungs are clear except for mild scarring or atelectasis at the bases. Bony thorax is intact.  IMPRESSION: No acute finding   Electronically Signed   By: Esperanza Heiraymond  Rubner M.D.   On: 11/30/2014 17:41   Ct Angio Chest Pe W/cm &/or Wo Cm  11/30/2014   CLINICAL DATA:  Right mid back pain. Concern pulmonary embolism. Urinary symptoms.  EXAM: CT ANGIOGRAPHY CHEST  CT ABDOMEN AND PELVIS WITH CONTRAST  TECHNIQUE: Multidetector CT imaging of the chest was performed using the standard protocol during bolus administration of intravenous contrast. Multiplanar CT image reconstructions and MIPs were obtained to evaluate the vascular anatomy. Multidetector CT imaging of the abdomen and pelvis was performed using the standard protocol during bolus administration of intravenous contrast.  CONTRAST:  100mL OMNIPAQUE IOHEXOL 350 MG/ML SOLN  COMPARISON:  CT 07/23/2012  FINDINGS: CTA CHEST FINDINGS  Mediastinum/Nodes: No filling defects within the pulmonary arteries to suggest acute pulmonary embolism. Acute findings aorta great vessels. There are intimal calcification of the aorta. There soft and calcified plaque within the aorta. No pericardial fluid. Esophagus is normal.  Lungs/Pleura: Centrilobular emphysema in the lung apices. No pulmonary edema. Mild linear scarring at the left lung base and right lung base.  Musculoskeletal: No aggressive osseous lesion.  CT ABDOMEN and PELVIS FINDINGS  Hepatobiliary: No biliary duct dilatation. Gallbladder is normal. No focal hepatic lesion. Common bile duct normal caliber.  Pancreas: Pancreas is normal. No ductal dilatation. No pancreatic inflammation.  Spleen: Normal spleen  Adrenals/urinary tract: There is a 23 mm lesion of the right adrenal gland which has washout characteristics characteristic  of a benign adenoma. No hydronephrosis. No ureteral lithiasis. Normal bladder.  Stomach/Bowel: Stomach, small bowel, appendix, and cecum are normal. The colon and rectosigmoid colon are normal.  Vascular/Lymphatic: Abdominal aorta is normal caliber. There is no retroperitoneal or periportal lymphadenopathy. No pelvic lymphadenopathy. Heavy calcification of the aorta.  Reproductive: Post hysterectomy anatomy.  Musculoskeletal: No aggressive osseous lesion. There is a compression fractures of the T6 and T9 vertebral bodies which are progressed from comparison exam.  Review of the MIP images confirms the above findings.  IMPRESSION: Chest Impression:  1. No acute pulmonary embolism. 2. No pneumonia or pleural fluid. Abdomen / Pelvis Impression:  1. No Acute findings the abdomen pelvis. 2. Benign right adrenal adenoma. 3. Atherosclerotic calcification aorta. 4. Progressive compression fractures at T6 and T9 compared to CT of 07/23/2012.   Electronically Signed   By: Genevive BiStewart  Edmunds M.D.   On: 11/30/2014 19:10   Ct Abdomen Pelvis W Contrast  11/30/2014   CLINICAL DATA:  Right mid back  pain. Concern pulmonary embolism. Urinary symptoms.  EXAM: CT ANGIOGRAPHY CHEST  CT ABDOMEN AND PELVIS WITH CONTRAST  TECHNIQUE: Multidetector CT imaging of the chest was performed using the standard protocol during bolus administration of intravenous contrast. Multiplanar CT image reconstructions and MIPs were obtained to evaluate the vascular anatomy. Multidetector CT imaging of the abdomen and pelvis was performed using the standard protocol during bolus administration of intravenous contrast.  CONTRAST:  OMNIPAQUE IOHEXOL 350 MG/ML SOLN  COMPARISON:  CT 07/23/2012  FINDINGS: CTA CHEST FINDINGS  Mediastinum/Nodes: No filling defects within the pulmonary arteries to suggest acute pulmonary embolism. Acute findings aorta great vessels. There are intimal calcification of the aorta. There soft and calcified plaque within the aorta.  No pericardial fluid. Esophagus is normal.  Lungs/Pleura: Centrilobular emphysema in the lung apices. No pulmonary edema. Mild linear scarring at the left lung base and right lung base.  Musculoskeletal: No aggressive osseous lesion.  CT ABDOMEN and PELVIS FINDINGS  Hepatobiliary: No biliary duct dilatation. Gallbladder is normal. No focal hepatic lesion. Common bile duct normal caliber.  Pancreas: Pancreas is normal. No ductal dilatation. No pancreatic inflammation.  Spleen: Normal spleen  Adrenals/urinary tract: There is a 23 mm lesion of the right adrenal gland which has washout characteristics characteristic of a benign adenoma. No hydronephrosis. No ureteral lithiasis. Normal bladder.  Stomach/Bowel: Stomach, small bowel, appendix, and cecum are normal. The colon and rectosigmoid colon are normal.  Vascular/Lymphatic: Abdominal aorta is normal caliber. There is no retroperitoneal or periportal lymphadenopathy. No pelvic lymphadenopathy. Heavy calcification of the aorta.  Reproductive: Post hysterectomy anatomy.  Musculoskeletal: No aggressive osseous lesion. There is a compression fractures of the T6 and T9 vertebral bodies which are progressed from comparison exam.  Review of the MIP images confirms the above findings.  IMPRESSION: Chest Impression:  1. No acute pulmonary embolism. 2. No pneumonia or pleural fluid. Abdomen / Pelvis Impression:  1. No Acute findings the abdomen pelvis. 2. Benign right adrenal adenoma. 3. Atherosclerotic calcification aorta. 4. Progressive compression fractures at T6 and T9 compared to CT of 07/23/2012.   Electronically Signed   By: Genevive Bi M.D.   On: 11/30/2014 19:10       Assessment & Plan:   AWV/z00.00 - Today patient counseled on age appropriate routine health concerns for screening and prevention, each reviewed and up to date or declined. Immunizations reviewed and up to date or declined. Labs reviewed. Risk factors for depression reviewed and negative.  Hearing function and visual acuity are intact. ADLs screened and addressed as needed. Functional ability and level of safety reviewed and appropriate. Education, counseling and referrals performed based on assessed risks today. Patient provided with a copy of personalized plan for preventive services.  Right lower extremity pain. Describes migrating pain from hip to knee to ankle, worse with ambulation activity. Suspect accommodation following gait disorder precipitated by hip fracture with pin repair September 2015. Has completed physical therapy and skilled facility. Recommend follow-up with orthopedics on same and add Celebrex for anti-inflammatory given inability to take traditional NSAIDs with daily Plavix and intolerance to narcotics or tramadol. Continue with Tylenol as first line relief  Problem List Items Addressed This Visit    Essential hypertension    Off amlodipine since 07/2010- on Bbloc only The current medical regimen is effective;  continue present plan and medications.  BP Readings from Last 3 Encounters:  02/04/15 122/82  12/22/14 119/64  12/10/14 132/82        Relevant Medications  pravastatin (PRAVACHOL) 20 MG tablet   Other Relevant Orders   TSH   Hyperlipidemia    On statin - check annually The current medical regimen is effective;  continue present plan and medications.       Relevant Medications   pravastatin (PRAVACHOL) 20 MG tablet   Other Relevant Orders   Lipid panel   Osteoporosis with fracture    R hip fx s/p fall 04/2014 - s/p pin repair 04/2104 in [place] Unable to gain insurance approval for Lyrica December 2015 and never began gabapentin  Has weaned off narcotics, no longer on oxygen, Dilaudid, MS Contin or tramadol  Add Celebrex as needed in addition to daily Tylenol  Continue HH PT and follow up  local ortho as ongoing Weight bearing exercise with calcium and vitamin D; bisphosphonates not prescribed at this time given patient preference for lack of  medication      Relevant Orders   TSH    Other Visit Diagnoses    Leg pain, right    -  Primary        Rene Paci, MD

## 2015-02-04 NOTE — Progress Notes (Signed)
Pre visit review using our clinic review tool, if applicable. No additional management support is needed unless otherwise documented below in the visit note. 

## 2015-02-04 NOTE — Assessment & Plan Note (Signed)
On statin - check annually The current medical regimen is effective;  continue present plan and medications.  

## 2015-02-04 NOTE — Assessment & Plan Note (Signed)
Off amlodipine since 07/2010- on Bbloc only The current medical regimen is effective;  continue present plan and medications.  BP Readings from Last 3 Encounters:  02/04/15 122/82  12/22/14 119/64  12/10/14 132/82

## 2015-02-04 NOTE — Assessment & Plan Note (Signed)
R hip fx s/p fall 04/2014 - s/p pin repair 04/2104 in [place] Unable to gain insurance approval for Lyrica December 2015 and never began gabapentin  Has weaned off narcotics, no longer on oxygen, Dilaudid, MS Contin or tramadol  Add Celebrex as needed in addition to daily Tylenol  Continue HH PT and follow up  local ortho as ongoing Weight bearing exercise with calcium and vitamin D; bisphosphonates not prescribed at this time given patient preference for lack of medication

## 2015-02-06 ENCOUNTER — Telehealth: Payer: Self-pay | Admitting: Internal Medicine

## 2015-02-06 ENCOUNTER — Other Ambulatory Visit: Payer: Self-pay

## 2015-02-06 NOTE — Telephone Encounter (Signed)
Patient was prescribed celebrex and pharmacy said they sent something in about there is something in them that she is allergic to. Also, she was waiting to hear back about her lab results because she was maybe going to lower the dosage on her pravastatin

## 2015-02-06 NOTE — Telephone Encounter (Signed)
Pt refuses to fill the celebrex due sulfonamide allergy. I explained since the allergy is unknown due it being such a long time ago that her particular allergy is unknown still insists that she will not take.  Pt is requests a different med.

## 2015-02-09 NOTE — Telephone Encounter (Signed)
Ok to not take celebrex given pt concern for potential allergy - I have removed from med list  Pt should take  pravastatin daily based on her numbers - LDL is 111 and should be <100 to minimize risk of stroke I have submitted this dose of prava to her pharmacy  thanks

## 2015-02-09 NOTE — Telephone Encounter (Signed)
Pt informed of results.   Pt asked: Are there any other meds that can be taken for pain?

## 2015-02-10 NOTE — Telephone Encounter (Signed)
I do not know of any prescription options Patient should maximize Tylenol 500 mg 3 times daily Thank you

## 2015-02-18 ENCOUNTER — Other Ambulatory Visit: Payer: Self-pay | Admitting: Specialist

## 2015-02-18 DIAGNOSIS — S22060A Wedge compression fracture of T7-T8 vertebra, initial encounter for closed fracture: Secondary | ICD-10-CM

## 2015-03-03 ENCOUNTER — Ambulatory Visit (INDEPENDENT_AMBULATORY_CARE_PROVIDER_SITE_OTHER): Payer: Medicare HMO | Admitting: Internal Medicine

## 2015-03-03 ENCOUNTER — Encounter: Payer: Self-pay | Admitting: Internal Medicine

## 2015-03-03 ENCOUNTER — Ambulatory Visit (INDEPENDENT_AMBULATORY_CARE_PROVIDER_SITE_OTHER)
Admission: RE | Admit: 2015-03-03 | Discharge: 2015-03-03 | Disposition: A | Discharge status: Home / Self Care | Payer: Medicare HMO | Source: Ambulatory Visit | Attending: Internal Medicine | Admitting: Internal Medicine

## 2015-03-03 VITALS — BP 132/80 | HR 81 | Temp 97.5°F | Ht 62.0 in | Wt 107.0 lb

## 2015-03-03 DIAGNOSIS — Z23 Encounter for immunization: Secondary | ICD-10-CM | POA: Diagnosis not present

## 2015-03-03 DIAGNOSIS — M8080XS Other osteoporosis with current pathological fracture, unspecified site, sequela: Secondary | ICD-10-CM

## 2015-03-03 DIAGNOSIS — E785 Hyperlipidemia, unspecified: Secondary | ICD-10-CM | POA: Diagnosis not present

## 2015-03-03 DIAGNOSIS — Z79899 Other long term (current) drug therapy: Secondary | ICD-10-CM

## 2015-03-03 NOTE — Progress Notes (Signed)
Subjective:    Patient ID: [name], female    DOB: June 07, 1922, 81 y.o.   MRN: [id number]  HPI  Patient here for medication follow-up. Still concerned with pain unrelieved by Tylenol. Interval and prior treatment options for same reviewed  Past Medical History  Diagnosis Date  . ARTHRITIS   . 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 CORONARY ARTERY DISEASE   . MACULAR DEGENERATION   . PERIPHERAL VASCULAR DISEASE   . Sebaceous cyst     L chin and R neck  . Subclavian steal syndrome   . HYPERTENSION   . HYPERLIPIDEMIA   . Cyst of breast 2007    cyst on breast  . Osteoporosis, post-menopausal 07/2012    T10 compression fx, R hip fx 04/02/14 after fall    Review of Systems  Constitutional: Negative for fever and fatigue.  Respiratory: Negative for cough and shortness of breath.   Cardiovascular: Negative for chest pain and leg swelling.  Musculoskeletal: Positive for back pain, arthralgias (R leg pain) and gait problem.       Objective:    Physical Exam  Constitutional: She appears well-developed and well-nourished. No distress.  Cardiovascular: Normal rate, regular rhythm and normal heart sounds.   No murmur heard. Pulmonary/Chest: Effort normal and breath sounds normal. No respiratory distress.  Musculoskeletal: She exhibits no edema.    BP 132/80 mmHg  Pulse 81  Temp(Src) 97.5 F (36.4 C) (Oral)  Ht 5\' 2"  (1.575 m)  Wt 107 lb (48.535 kg)  BMI 19.57 kg/m2  SpO2 93% Wt Readings from Last 3 Encounters:  03/03/15 107 lb (48.535 kg)  02/04/15 107 lb 2 oz (48.592 kg)  12/22/14 110 lb 6.4 oz (50.077 kg)     Lab Results  Component Value Date   WBC 9.5 12/02/2014   HGB 12.5 12/02/2014   HCT 40.5 12/02/2014   PLT 307 12/02/2014   GLUCOSE 107* 12/04/2014   CHOL 184 02/04/2015   TRIG 123.0 02/04/2015   HDL 48.60 02/04/2015   LDLCALC 111* 02/04/2015   ALT 11* 11/30/2014   AST 17 11/30/2014   NA 136 12/04/2014   K 3.9 12/04/2014   CL  105 12/04/2014   CREATININE 0.75 12/04/2014   BUN 24* 12/04/2014   CO2 26 12/04/2014   TSH 2.00 02/04/2015   INR 0.96 06/28/2010   HGBA1C 6.4 01/08/2014    Dg Ribs Unilateral W/chest Right  11/30/2014   CLINICAL DATA:  Chronic right upper back pain no trauma  EXAM: RIGHT RIBS AND CHEST - 3+ VIEW  COMPARISON:  Numerous prior studies including 07/23/2012  FINDINGS: Mild cardiac enlargement. Pronounced uncoiling of the aorta. Lungs are clear except for mild scarring or atelectasis at the bases. Bony thorax is intact.  IMPRESSION: No acute finding   Electronically Signed   By: Esperanza Heir M.D.   On: 11/30/2014 17:41   Ct Angio Chest Pe W/cm &/or Wo Cm  11/30/2014   CLINICAL DATA:  Right mid back pain. Concern pulmonary embolism. Urinary symptoms.  EXAM: CT ANGIOGRAPHY CHEST  CT ABDOMEN AND PELVIS WITH CONTRAST  TECHNIQUE: Multidetector CT imaging of the chest was performed using the standard protocol during bolus administration of intravenous contrast. Multiplanar CT image reconstructions and MIPs were obtained to evaluate the vascular anatomy. Multidetector CT imaging of the abdomen and pelvis was performed using the standard protocol during bolus administration of intravenous contrast.  CONTRAST:  OMNIPAQUE IOHEXOL 350 MG/ML SOLN  COMPARISON:  CT 07/23/2012  FINDINGS: CTA CHEST FINDINGS  Mediastinum/Nodes: No filling defects within the pulmonary arteries to suggest acute pulmonary embolism. Acute findings aorta great vessels. There are intimal calcification of the aorta. There soft and calcified plaque within the aorta. No pericardial fluid. Esophagus is normal.  Lungs/Pleura: Centrilobular emphysema in the lung apices. No pulmonary edema. Mild linear scarring at the left lung base and right lung base.  Musculoskeletal: No aggressive osseous lesion.  CT ABDOMEN and PELVIS FINDINGS  Hepatobiliary: No biliary duct dilatation. Gallbladder is normal. No focal hepatic lesion. Common bile duct normal  caliber.  Pancreas: Pancreas is normal. No ductal dilatation. No pancreatic inflammation.  Spleen: Normal spleen  Adrenals/urinary tract: There is a 23 mm lesion of the right adrenal gland which has washout characteristics characteristic of a benign adenoma. No hydronephrosis. No ureteral lithiasis. Normal bladder.  Stomach/Bowel: Stomach, small bowel, appendix, and cecum are normal. The colon and rectosigmoid colon are normal.  Vascular/Lymphatic: Abdominal aorta is normal caliber. There is no retroperitoneal or periportal lymphadenopathy. No pelvic lymphadenopathy. Heavy calcification of the aorta.  Reproductive: Post hysterectomy anatomy.  Musculoskeletal: No aggressive osseous lesion. There is a compression fractures of the T6 and T9 vertebral bodies which are progressed from comparison exam.  Review of the MIP images confirms the above findings.  IMPRESSION: Chest Impression:  1. No acute pulmonary embolism. 2. No pneumonia or pleural fluid. Abdomen / Pelvis Impression:  1. No Acute findings the abdomen pelvis. 2. Benign right adrenal adenoma. 3. Atherosclerotic calcification aorta. 4. Progressive compression fractures at T6 and T9 compared to CT of 07/23/2012.   Electronically Signed   By: Genevive Bi M.D.   On: 11/30/2014 19:10   Ct Abdomen Pelvis W Contrast  11/30/2014   CLINICAL DATA:  Right mid back pain. Concern pulmonary embolism. Urinary symptoms.  EXAM: CT ANGIOGRAPHY CHEST  CT ABDOMEN AND PELVIS WITH CONTRAST  TECHNIQUE: Multidetector CT imaging of the chest was performed using the standard protocol during bolus administration of intravenous contrast. Multiplanar CT image reconstructions and MIPs were obtained to evaluate the vascular anatomy. Multidetector CT imaging of the abdomen and pelvis was performed using the standard protocol during bolus administration of intravenous contrast.  CONTRAST:  OMNIPAQUE IOHEXOL 350 MG/ML SOLN  COMPARISON:  CT 07/23/2012  FINDINGS: CTA CHEST FINDINGS   Mediastinum/Nodes: No filling defects within the pulmonary arteries to suggest acute pulmonary embolism. Acute findings aorta great vessels. There are intimal calcification of the aorta. There soft and calcified plaque within the aorta. No pericardial fluid. Esophagus is normal.  Lungs/Pleura: Centrilobular emphysema in the lung apices. No pulmonary edema. Mild linear scarring at the left lung base and right lung base.  Musculoskeletal: No aggressive osseous lesion.  CT ABDOMEN and PELVIS FINDINGS  Hepatobiliary: No biliary duct dilatation. Gallbladder is normal. No focal hepatic lesion. Common bile duct normal caliber.  Pancreas: Pancreas is normal. No ductal dilatation. No pancreatic inflammation.  Spleen: Normal spleen  Adrenals/urinary tract: There is a 23 mm lesion of the right adrenal gland which has washout characteristics characteristic of a benign adenoma. No hydronephrosis. No ureteral lithiasis. Normal bladder.  Stomach/Bowel: Stomach, small bowel, appendix, and cecum are normal. The colon and rectosigmoid colon are normal.  Vascular/Lymphatic: Abdominal aorta is normal caliber. There is no retroperitoneal or periportal lymphadenopathy. No pelvic lymphadenopathy. Heavy calcification of the aorta.  Reproductive: Post hysterectomy anatomy.  Musculoskeletal: No aggressive osseous lesion. There is a compression fractures of the T6 and T9 vertebral bodies which are  progressed from comparison exam.  Review of the MIP images confirms the above findings.  IMPRESSION: Chest Impression:  1. No acute pulmonary embolism. 2. No pneumonia or pleural fluid. Abdomen / Pelvis Impression:  1. No Acute findings the abdomen pelvis. 2. Benign right adrenal adenoma. 3. Atherosclerotic calcification aorta. 4. Progressive compression fractures at T6 and T9 compared to CT of 07/23/2012.   Electronically Signed   By: Genevive Bi M.D.   On: 11/30/2014 19:10       Assessment & Plan:   Problem List Items Addressed This  Visit    Hyperlipidemia    Recommended to continue pravastatin July 2016 though patient wishes to discontinue same  - check annually      Osteoporosis with fracture - Primary    R hip fx s/p fall 04/2014 - s/p pin repair 04/2014  History of T10 compression fracture December 2013 new T8 compression fracture evaluated by orthospine July 2016 Unable to gain insurance approval for Lyrica December 2015 and never began gabapentin due to concern for side effects Had weaned off narcotics, no longer on oxycontin, Dilaudid, MS Contin or tramadol  Resumed hydrocodone 5/325 every 6 hours when necessary July 2016, currently taking 3 times a day, discussed schedule to wean off same slowly has Declined to begin Celebrex as needed 01/2015 as prescribed due to remote sulfa allergy  Declined to consider vertebroplasty is recommended by or though, therefore not planning to proceed with MRI as scheduled Continue HH PT and follow up local ortho as ongoing Weight bearing exercise with calcium and vitamin D Check DEXA to quantify degree of osteoporosis noting bisphosphonates not prescribed at this time given patient preference for lack of medication - ?Prolia      Relevant Orders   DG Bone Density       Rene Paci, MD

## 2015-03-03 NOTE — Assessment & Plan Note (Signed)
R hip fx s/p fall 04/2014 - s/p pin repair 04/2014  History of T10 compression fracture December 2013 new T8 compression fracture evaluated by orthospine July 2016 Unable to gain insurance approval for Lyrica December 2015 and never began gabapentin due to concern for side effects Had weaned off narcotics, no longer on oxycontin, Dilaudid, MS Contin or tramadol  Resumed hydrocodone 5/325 every 6 hours when necessary July 2016, currently taking 3 times a day, discussed schedule to wean off same slowly has Declined to begin Celebrex as needed 01/2015 as prescribed due to remote sulfa allergy  Declined to consider vertebroplasty is recommended by or though, therefore not planning to proceed with MRI as scheduled Continue HH PT and follow up local ortho as ongoing Weight bearing exercise with calcium and vitamin D Check DEXA to quantify degree of osteoporosis noting bisphosphonates not prescribed at this time given patient preference for lack of medication - ?Prolia

## 2015-03-03 NOTE — Assessment & Plan Note (Signed)
Recommended to continue pravastatin July 2016 though patient wishes to discontinue same  - check annually

## 2015-03-03 NOTE — Progress Notes (Signed)
Pre visit review using our clinic review tool, if applicable. No additional management support is needed unless otherwise documented below in the visit note. 

## 2015-03-03 NOTE — Patient Instructions (Addendum)
It was good to see you today.  We have reviewed your prior records including labs and tests today  Bone density test ordered today. Your results will be released to MyChart (or called to you) after review, usually within 72hours after test completion. If any changes need to be made, you will be notified at that same time.  If bone density confirms osteoporosis, consider Fosamax type pills versus Prolia injections in our office every 6 months to treat osteoporosis  Medications reviewed and updated, no other changes recommended at this time. Hydrocodone pills for pain control as ongoing, please contact this office for refills as needed  Please schedule followup in 3-4 months, call sooner if problems.  Osteoporosis Throughout your life, your body breaks down old bone and replaces it with new bone. As you get older, your body does not replace bone as quickly as it breaks it down. By the age of 30 years, most people begin to gradually lose bone because of the imbalance between bone loss and replacement. Some people lose more bone than others. Bone loss beyond a specified normal degree is considered osteoporosis.  Osteoporosis affects the strength and durability of your bones. The inside of the ends of your bones and your flat bones, like the bones of your pelvis, look like honeycomb, filled with tiny open spaces. As bone loss occurs, your bones become less dense. This means that the open spaces inside your bones become bigger and the walls between these spaces become thinner. This makes your bones weaker. Bones of a person with osteoporosis can become so weak that they can break (fracture) during minor accidents, such as a simple fall. CAUSES  The following factors have been associated with the development of osteoporosis:  Smoking.  Drinking more than 2 alcoholic drinks several days per week.  Long-term use of certain medicines:  Corticosteroids.  Chemotherapy medicines.  Thyroid  medicines.  Antiepileptic medicines.  Gonadal hormone suppression medicine.  Immunosuppression medicine.  Being underweight.  Lack of physical activity.  Lack of exposure to the sun. This can lead to vitamin D deficiency.  Certain medical conditions:  Certain inflammatory bowel diseases, such as Crohn disease and ulcerative colitis.  Diabetes.  Hyperthyroidism.  Hyperparathyroidism. RISK FACTORS Anyone can develop osteoporosis. However, the following factors can increase your risk of developing osteoporosis:  Gender--Women are at higher risk than men.  Age--Being older than 50 years increases your risk.  Ethnicity--White and Asian people have an increased risk.  Weight --Being extremely underweight can increase your risk of osteoporosis.  Family history of osteoporosis--Having a family member who has developed osteoporosis can increase your risk. SYMPTOMS  Usually, people with osteoporosis have no symptoms.  DIAGNOSIS  Signs during a physical exam that may prompt your caregiver to suspect osteoporosis include:  Decreased height. This is usually caused by the compression of the bones that form your spine (vertebrae) because they have weakened and become fractured.  A curving or rounding of the upper back (kyphosis). To confirm signs of osteoporosis, your caregiver may request a procedure that uses 2 low-dose X-ray beams with different levels of energy to measure your bone mineral density (dual-energy X-ray absorptiometry [DXA]). Also, your caregiver may check your level of vitamin D. TREATMENT  The goal of osteoporosis treatment is to strengthen bones in order to decrease the risk of bone fractures. There are different types of medicines available to help achieve this goal. Some of these medicines work by slowing the processes of bone loss. Some medicines work  by increasing bone density. Treatment also involves making sure that your levels of calcium and vitamin D are  adequate. PREVENTION  There are things you can do to help prevent osteoporosis. Adequate intake of calcium and vitamin D can help you achieve optimal bone mineral density. Regular exercise can also help, especially resistance and weight-bearing activities. If you smoke, quitting smoking is an important part of osteoporosis prevention. MAKE SURE YOU:  Understand these instructions.  Will watch your condition.  Will get help right away if you are not doing well or get worse. FOR MORE INFORMATION www.osteo.org and [employer] Document Released: 04/27/2005 Document Revised: 11/12/2012 Document Reviewed: 07/02/2011 [facility] Patient Information 2015 [place], [place]. This information is not intended to replace advice given to you by your health care provider. Make sure you discuss any questions you have with your health care provider.

## 2015-03-05 ENCOUNTER — Encounter: Payer: Self-pay | Admitting: Internal Medicine

## 2015-03-06 NOTE — Telephone Encounter (Signed)
Spoke to pt and informed of the bone density results. Informed that PCP will be out until 03/16/15 and that current recommendation is to take in an adequate amount of Calcium and Vitamin D.

## 2015-03-18 ENCOUNTER — Encounter: Payer: Self-pay | Admitting: Internal Medicine

## 2015-03-19 NOTE — Telephone Encounter (Signed)
FYI: Pt email.

## 2015-03-23 ENCOUNTER — Telehealth: Payer: Self-pay | Admitting: Internal Medicine

## 2015-03-23 NOTE — Telephone Encounter (Signed)
Called pt back spoke with daughter/pt on intercom. Pt is wanting to know out of the three med she listed for osteoporosis which one does she recommend for her & what are the side effects. Also was told to take 1200 mg of calcium. But mom can not swallow pill she is wanting to know can she just take the Vitamin D3 instead, and lastly requesting refill on her hydrocodone 5/325 take 1 three times a day. Inform pt/daughter md is out this afternoon, but will forward msg to md and give them a call bck tomorrow with her response...Raechel Chute

## 2015-03-23 NOTE — Telephone Encounter (Signed)
Patient would like to talk to you about choosing a med for her bones and is requesting refill on hydrocodone.

## 2015-03-23 NOTE — Telephone Encounter (Signed)
Can contact pt tomorrow unless Valentina Gu has a chance today.

## 2015-03-24 MED ORDER — HYDROCODONE-ACETAMINOPHEN 5-325 MG PO TABS: 1.0000 | ORAL_TABLET | Freq: Four times a day (QID) | ORAL | Status: DC | PRN

## 2015-03-24 NOTE — Telephone Encounter (Signed)
LVM for pt to call back as soon as possible.   RE: rx is ready for pick up, schedule OV 30 min to discuss osteoporosis therapy options and other med questions.

## 2015-03-24 NOTE — Telephone Encounter (Signed)
OV to review med options Ok to take Vit D3 solo Ok to hydrocodone refill - printed and signed

## 2015-04-02 ENCOUNTER — Encounter: Payer: Self-pay | Admitting: Internal Medicine

## 2015-04-07 MED ORDER — RISEDRONATE SODIUM 150 MG PO TABS: 150.0000 mg | ORAL_TABLET | ORAL | Status: DC

## 2015-04-07 NOTE — Telephone Encounter (Signed)
Pt dtr came to the office and stated that the osteoporosis tx that pt is willing to try is actonel.   Please advise if this is okay.

## 2015-04-07 NOTE — Telephone Encounter (Signed)
Ok - erx for q30d done [facility] msg sent to str on same

## 2015-04-22 ENCOUNTER — Telehealth: Payer: Self-pay | Admitting: Internal Medicine

## 2015-04-23 MED ORDER — HYDROCODONE-ACETAMINOPHEN 5-325 MG PO TABS: 1.0000 | ORAL_TABLET | Freq: Two times a day (BID) | ORAL | Status: DC | PRN

## 2015-04-23 NOTE — Addendum Note (Signed)
Addended by: Genella Mech A on: 04/23/2015 01:11 PM   Modules accepted: Orders

## 2015-04-23 NOTE — Telephone Encounter (Signed)
Printed and signed.  

## 2015-04-24 ENCOUNTER — Other Ambulatory Visit: Payer: Self-pay | Admitting: Internal Medicine

## 2015-04-24 MED ORDER — HYDROCODONE-ACETAMINOPHEN 5-325 MG PO TABS: 1.0000 | ORAL_TABLET | Freq: Two times a day (BID) | ORAL | Status: DC | PRN

## 2015-05-12 ENCOUNTER — Other Ambulatory Visit: Payer: Self-pay | Admitting: Internal Medicine

## 2015-05-13 ENCOUNTER — Other Ambulatory Visit: Payer: Self-pay

## 2015-05-13 ENCOUNTER — Encounter: Payer: Self-pay | Admitting: Internal Medicine

## 2015-05-13 MED ORDER — HYDROCODONE-ACETAMINOPHEN 5-325 MG PO TABS: 1.0000 | ORAL_TABLET | Freq: Two times a day (BID) | ORAL | Status: DC | PRN

## 2015-05-13 NOTE — Telephone Encounter (Signed)
This is not due for refill until 05/23/15 so will not fill at this time. Can be filled next week by PCP

## 2015-05-18 ENCOUNTER — Other Ambulatory Visit: Payer: Self-pay

## 2015-05-18 MED ORDER — HYDROCODONE-ACETAMINOPHEN 5-325 MG PO TABS: 1.0000 | ORAL_TABLET | Freq: Two times a day (BID) | ORAL | Status: DC | PRN

## 2015-06-11 ENCOUNTER — Ambulatory Visit (INDEPENDENT_AMBULATORY_CARE_PROVIDER_SITE_OTHER): Payer: Medicare HMO | Admitting: Internal Medicine

## 2015-06-11 ENCOUNTER — Encounter: Payer: Self-pay | Admitting: Internal Medicine

## 2015-06-11 VITALS — BP 144/100 | HR 66 | Temp 97.4°F | Ht 62.0 in | Wt 109.0 lb

## 2015-06-11 DIAGNOSIS — G4700 Insomnia, unspecified: Secondary | ICD-10-CM | POA: Diagnosis not present

## 2015-06-11 DIAGNOSIS — M8080XS Other osteoporosis with current pathological fracture, unspecified site, sequela: Secondary | ICD-10-CM

## 2015-06-11 MED ORDER — MELATONIN 1 MG PO TABS: 1.0000 mg | ORAL_TABLET | Freq: Every evening | ORAL | Status: DC | PRN

## 2015-06-11 MED ORDER — TRAMADOL HCL 50 MG PO TABS: 50.0000 mg | ORAL_TABLET | Freq: Three times a day (TID) | ORAL | Status: DC | PRN

## 2015-06-11 MED ORDER — RISEDRONATE SODIUM 35 MG PO TABS: 35.0000 mg | ORAL_TABLET | ORAL | Status: DC

## 2015-06-11 NOTE — Progress Notes (Signed)
Pre visit review using our clinic review tool, if applicable. No additional management support is needed unless otherwise documented below in the visit note. 

## 2015-06-11 NOTE — Progress Notes (Signed)
Subjective:    Patient ID: [name], female    DOB: 08/24/1921, 81 y.o.   MRN: [id number]  HPI  Patient here for follow up Also reviewed chronic medical conditions, interval events and current concerns  Past Medical History  Diagnosis Date  . ARTHRITIS   . 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 CORONARY ARTERY DISEASE   . MACULAR DEGENERATION   . PERIPHERAL VASCULAR DISEASE   . Sebaceous cyst     L chin and R neck  . Subclavian steal syndrome   . HYPERTENSION   . HYPERLIPIDEMIA   . Cyst of breast 2007    cyst on breast  . Osteoporosis, post-menopausal 07/2012    T10 compression fx, R hip fx 04/02/14 after fall    Review of Systems  Constitutional: Positive for fatigue. Negative for unexpected weight change.  Respiratory: Negative for cough and shortness of breath.   Cardiovascular: Negative for chest pain and leg swelling.       Objective:    Physical Exam  Constitutional: She is oriented to person, place, and time. She appears well-developed and well-nourished. No distress.  dtr at side  Cardiovascular: Normal rate, regular rhythm and normal heart sounds.   No murmur heard. Pulmonary/Chest: Effort normal and breath sounds normal. No respiratory distress.  Musculoskeletal: She exhibits no edema.  Neurological: She is alert and oriented to person, place, and time. No cranial nerve deficit. Coordination normal.  Psychiatric: She has a normal mood and affect. Her behavior is normal. Judgment and thought content normal.    BP 144/100 mmHg  Pulse 66  Temp(Src) 97.4 F (36.3 C) (Oral)  Ht 5\' 2"  (1.575 m)  Wt 109 lb (49.442 kg)  BMI 19.93 kg/m2  SpO2 96% Wt Readings from Last 3 Encounters:  06/11/15 109 lb (49.442 kg)  03/03/15 107 lb (48.535 kg)  02/04/15 107 lb 2 oz (48.592 kg)    Lab Results  Component Value Date   WBC 9.5 12/02/2014   HGB 12.5 12/02/2014   HCT 40.5 12/02/2014   PLT 307 12/02/2014   GLUCOSE 107* 12/04/2014   CHOL  184 02/04/2015   TRIG 123.0 02/04/2015   HDL 48.60 02/04/2015   LDLCALC 111* 02/04/2015   ALT 11* 11/30/2014   AST 17 11/30/2014   NA 136 12/04/2014   K 3.9 12/04/2014   CL 105 12/04/2014   CREATININE 0.75 12/04/2014   BUN 24* 12/04/2014   CO2 26 12/04/2014   TSH 2.00 02/04/2015   INR 0.96 06/28/2010   HGBA1C 6.4 01/08/2014    Dg Bone Density  03/04/2015  Date of study: 03/03/2015 Exam: DUAL X-RAY ABSORPTIOMETRY (DXA) FOR BONE MINERAL DENSITY (BMD) Instrument: [employer], [employer] Requesting Provider: PCP Indication: Follow-up for osteoporosis Comparison: none (please note that it is not possible to compare data from different instruments) Clinical data: Pt is a 81 y.o. female with h/o right hip and T10 vertebral fracture. Results:  Lumbar spine (L1-L4) Femoral neck (FN) T-score - 0.6 RFN: n/a LFN: - 3.5 Assessment: Patient has OSTEOPOROSIS according to the [facility] classification for osteoporosis (see below). Fracture risk: high Comments: the technical quality of the study is good, however, the spine is scoliotic and arthritic. Calcium accumulation in arthritic sites can confound the results of the bone density scan. L3 vertebra had to be excluded from analysis. Right femoral neck could not be analyzed due to previous hip fracture. Evaluation for secondary causes should be considered if clinically indicated. Recommend  optimizing calcium (1200 mg/day) and vitamin D (800 IU/day). Followup: Repeat BMD is appropriate after 2 years or after 1-2 years if starting treatment. WHO criteria for diagnosis of osteoporosis in postmenopausal women and in men 80 y/o or older: - normal: T-score -1.0 to + 1.0 - osteopenia/low bone density: T-score between -2.5 and -1.0 - osteoporosis: T-score below -2.5 - severe osteoporosis: T-score below -2.5 with history of fragility fracture Note: although not part of the WHO classification, the presence of a fragility fracture, regardless of the T-score, should be considered  diagnostic of osteoporosis, provided other causes for the fracture have been excluded. Treatment: The National Osteoporosis Foundation recommends that treatment be considered in postmenopausal women and men age 25 or older with: 1. Hip or vertebral (clinical or morphometric) fracture 2. T-score of - 2.5 or lower at the spine or hip 3. 10-year fracture probability by FRAX of at least 20% for a major osteoporotic fracture and 3% for a hip fracture Carlus Pavlov, MD [facility]       Assessment & Plan:   Occasional insomnia - ok for prn OTC melatonin   Problem List Items Addressed This Visit    Osteoporosis with fracture - Primary    R hip fx s/p fall 04/2014 - s/p pin repair 04/2014  History of T10 compression fracture December 2013 new T8 compression fracture evaluated by orthospine July 2016 Unable to gain insurance approval for Lyrica December 2015 and never began gabapentin due to concern for side effects Has largely weaned off narcotics: no longer on oxycontin, Dilaudid, MS Contin or tramadol  Resumed hydrocodone 5/325 every 6 hours when necessary July 2016, currently taking only prn Renew tramadol to use prn if pain not severe enough to require hydrocodone but needing more than tylenol Declined to begin Celebrex 01/2015 as prescribed due to remote sulfa allergy  Declined to consider vertebroplasty is recommended by ortho, therefore did not proceed with MRI as was scheduled Continue HH PT and follow up local ortho prn Weight bearing exercise with calcium and vitamin D After showing "severe" osteoporosis on DEXA 03/2015, reluctantly agreed to begin bisphosphonates  Monthly Actonel requested - causing nausea on empty stomach so will change to weekly at pt pref - still needs to be empty stomach for absorbtion Recheck DEXA 03/2016 to monitor effects - may consider DC vs alternate rx if not improved?           Rene Paci, MD

## 2015-06-11 NOTE — Patient Instructions (Signed)
It was good to see you today.  We have reviewed your prior records including labs and tests today  Medications reviewed and updated  Change Actonel from 150 mg monthly to 35 mg tablet weekly As before, take on an empty stomach and take 30 minutes prior to any other food or medication   Also okay to use tramadol as needed for moderate pain in place of hydrocodone when needed for pain   Okay to use melatonin over-the-counter for insomnia as needed  Please schedule followup in 6-8 weeks with new PCP, call sooner if problems.

## 2015-06-11 NOTE — Assessment & Plan Note (Signed)
R hip fx s/p fall 04/2014 - s/p pin repair 04/2014  History of T10 compression fracture December 2013 new T8 compression fracture evaluated by orthospine July 2016 Unable to gain insurance approval for Lyrica December 2015 and never began gabapentin due to concern for side effects Has largely weaned off narcotics: no longer on oxycontin, Dilaudid, MS Contin or tramadol  Resumed hydrocodone 5/325 every 6 hours when necessary July 2016, currently taking only prn Renew tramadol to use prn if pain not severe enough to require hydrocodone but needing more than tylenol Declined to begin Celebrex 01/2015 as prescribed due to remote sulfa allergy  Declined to consider vertebroplasty is recommended by ortho, therefore did not proceed with MRI as was scheduled Continue HH PT and follow up local ortho prn Weight bearing exercise with calcium and vitamin D After showing "severe" osteoporosis on DEXA 03/2015, reluctantly agreed to begin bisphosphonates  Monthly Actonel requested - causing nausea on empty stomach so will change to weekly at pt pref - still needs to be empty stomach for absorbtion Recheck DEXA 03/2016 to monitor effects - may consider DC vs alternate rx if not improved?

## 2015-07-15 ENCOUNTER — Other Ambulatory Visit: Payer: Self-pay | Admitting: Internal Medicine

## 2015-07-22 ENCOUNTER — Ambulatory Visit: Payer: Medicare HMO | Admitting: Internal Medicine

## 2015-08-07 ENCOUNTER — Encounter: Payer: Self-pay | Admitting: Internal Medicine

## 2015-08-07 ENCOUNTER — Ambulatory Visit (INDEPENDENT_AMBULATORY_CARE_PROVIDER_SITE_OTHER): Payer: Medicare HMO | Admitting: Internal Medicine

## 2015-08-07 VITALS — BP 132/84 | HR 65 | Temp 97.5°F | Resp 16 | Wt 111.0 lb

## 2015-08-07 DIAGNOSIS — E785 Hyperlipidemia, unspecified: Secondary | ICD-10-CM

## 2015-08-07 DIAGNOSIS — M8080XS Other osteoporosis with current pathological fracture, unspecified site, sequela: Secondary | ICD-10-CM

## 2015-08-07 DIAGNOSIS — I739 Peripheral vascular disease, unspecified: Secondary | ICD-10-CM

## 2015-08-07 DIAGNOSIS — I779 Disorder of arteries and arterioles, unspecified: Secondary | ICD-10-CM | POA: Diagnosis not present

## 2015-08-07 DIAGNOSIS — I10 Essential (primary) hypertension: Secondary | ICD-10-CM

## 2015-08-07 MED ORDER — RISEDRONATE SODIUM 35 MG PO TABS: 35.0000 mg | ORAL_TABLET | ORAL | Status: DC

## 2015-08-07 MED ORDER — VITAMIN D3 25 MCG (1000 UT) PO CAPS: ORAL_CAPSULE | ORAL | Status: DC

## 2015-08-07 MED ORDER — CALCIUM CITRATE PETITE/VIT D 200-250 MG-UNIT PO TABS: 2.0000 | ORAL_TABLET | Freq: Two times a day (BID) | ORAL | Status: DC

## 2015-08-07 NOTE — Patient Instructions (Addendum)
  We have reviewed your prior records including labs and tests today.   All other Health Maintenance issues reviewed.   All recommended immunizations and age-appropriate screenings are up-to-date.  No immunizations administered today.   Medications reviewed and updated.   No changes recommended at this time.   Please schedule followup in 6 months      

## 2015-08-07 NOTE — Progress Notes (Signed)
Subjective:    Patient ID: [name], female    DOB: 03-29-1922, 82 y.o.   MRN: [id number]  HPI She is here to establish with a new pcp.   Hyperlipidemia: She is taking her medication daily. She is compliant with a low fat/cholesterol diet. She is not exercising regularly. She denies myalgias.   Hypertension: She is taking her medication daily. She is compliant with a low sodium diet.  She denies chest pain, palpitations, edema, shortness of breath and regular headaches. She is exercising regularly.  She does not monitor her blood pressure at home.    She broke her hip last year and had a rod placed.  She recently noticed that her right leg is a little shorter.  She wonders if these show that would help. She does not do a lot of walking and typically walks with a walker when she does. She lives in an assisted living facility. This is the only area that she has pain when rhinitis. She is taking calcium and vitamin D, but not daily.    History of CVA, PVD, carotid artery disease and subclavian steal syndrome: She is taking Plavix daily. She was on with vascular, has not seen him for a couple of years. Last carotid ultrasound was 2 years ago.  Osteoporosis: she started the actonel three months ago.  She is taking calcium and vitamin D, but not necessarily daily.  She is exercises minimally.  She does experience some nausea with the Actonel and originally she was taking it monthly and was switched to weekly. This did help, but she still has symptoms. She is resistant to take anything.  Medications and allergies reviewed with patient and updated if appropriate.  Patient Active Problem List   Diagnosis Date Noted  . Compression fracture of thoracic vertebra (HCC) 12/03/2014  . Bacteria in urine 11/30/2014  . Intractable back pain 11/30/2014  . Hyperglycemia 07/10/2013  . Osteoporosis with fracture   . Occlusion and stenosis of carotid artery without mention of cerebral infarction 01/18/2012   . SEBACEOUS CYST 10/12/2010  . MACULAR DEGENERATION 07/13/2010  . 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 SUBCLAVIAN STEAL SYNDROME 07/13/2010  . PERIPHERAL VASCULAR DISEASE 07/13/2010  . ARTHRITIS 07/13/2010  . Hyperlipidemia 07/12/2010  . Essential hypertension 07/12/2010  . Coronary atherosclerosis 07/12/2010    Current Outpatient Prescriptions on File Prior to Visit  Medication Sig Dispense Refill  . atenolol (TENORMIN) 25 MG tablet TAKE 1 TABLET BY MOUTH DAILY . TAKE WITH 50 MG TABLET FOR A TOTAL OF 75 MG 90 tablet 3  . atenolol (TENORMIN) 50 MG tablet TAKE 1 TABLET BY MOUTH DAILY . TAKE WITH 25 MG TABLET FOR A TOTAL OF 75 MG. 90 tablet 3  . clopidogrel (PLAVIX) 75 MG tablet Take 1 tablet (75 mg total) by mouth daily. 90 tablet 2  . pravastatin (PRAVACHOL) 20 MG tablet Take 1 tablet (20 mg total) by mouth daily. 90 tablet 2  . risedronate (ACTONEL) 150 MG tablet TAKE 1 TABLET (150 MG TOTAL) BY MOUTH EVERY 30 (THIRTY) DAYS. WITH WATER ON EMPTY STOMACH, NOTHING BY MOUTH OR LIE DOWN FOR NEXT 30 MINUTES. 3 tablet 3   No current facility-administered medications on file prior to visit.    Past Medical History  Diagnosis Date  . ARTHRITIS   . 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 CORONARY ARTERY DISEASE   . MACULAR DEGENERATION   . PERIPHERAL VASCULAR DISEASE   .  Sebaceous cyst     L chin and R neck  . Subclavian steal syndrome   . HYPERTENSION   . HYPERLIPIDEMIA   . Cyst of breast 2007    cyst on breast  . 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 Tonsillectomy    . Sabacious cyst removed from breast  2011  . Breast surgery  2011    sebacious cyst removal   . Hip pinning Right 04/2014    Social History   Social History  . Marital Status: Widowed    Spouse Name: N/A  . Number of Children: N/A  . Years of Education: N/A    Occupational History  . Retired from [employer]    Social History Main Topics  . Smoking status: Former Smoker -- 1.00 packs/day for 61 years    Quit date: 09/29/2009  . Smokeless tobacco: Never Used  . Alcohol Use: 0.0 oz/week    0 Standard drinks or equivalent per week     Comment: Occasional  . Drug Use: No  . Sexual Activity: No   Other Topics Concern  . None   Social History Narrative   [facility]. [place] fall of [place] to be near daughter    Family History  Problem Relation Age of Onset  . Heart disease Other     Parent  . Miscarriages / Stillbirths Other   . Heart attack Father   . Cancer Mother   . Hyperlipidemia Brother   . Hypertension Brother   . Stroke Brother     Review of Systems  Constitutional: Negative for fever and appetite change.  Respiratory: Negative for cough, shortness of breath and wheezing.   Cardiovascular: Negative for chest pain and palpitations.  Gastrointestinal: Negative for abdominal pain.  Musculoskeletal:       Pain in right upper leg - history of fracture and rod placed  Neurological: Negative for dizziness, light-headedness and headaches.       Objective:   Filed Vitals:   08/07/15 1327  BP: 132/84  Pulse: 65  Temp: 97.5 F (36.4 C)  Resp: 16   Filed Weights   08/07/15 1327  Weight: 111 lb (50.349 kg)   Body mass index is 20.3 kg/(m^2).   Physical Exam  Constitutional: She appears well-developed and well-nourished.  HENT:  Head: Normocephalic and atraumatic.  Neck: Neck supple. No tracheal deviation present. No thyromegaly present.  Cardiovascular: Normal rate, regular rhythm and normal heart sounds.   Pulmonary/Chest: Effort normal and breath sounds normal. No respiratory distress. She has no wheezes. She has no rales.  Abdominal: Soft. She exhibits no distension. There is no tenderness.  Musculoskeletal: She exhibits no edema.  Lymphadenopathy:    She has no  cervical adenopathy.  Psychiatric: She has a normal mood and affect.        Assessment & Plan:   See problem list for assessment and plan  Follow-up in approximately 6 months, sooner if needed

## 2015-08-07 NOTE — Progress Notes (Signed)
Pre visit review using our clinic review tool, if applicable. No additional management support is needed unless otherwise documented below in the visit note. 

## 2015-08-08 DIAGNOSIS — I779 Disorder of arteries and arterioles, unspecified: Secondary | ICD-10-CM | POA: Insufficient documentation

## 2015-08-08 DIAGNOSIS — I739 Peripheral vascular disease, unspecified: Secondary | ICD-10-CM

## 2015-08-08 NOTE — Assessment & Plan Note (Signed)
Blood pressure controlled Continue current dose of atenolol

## 2015-08-08 NOTE — Assessment & Plan Note (Signed)
She was seen vascular, but has not seen them in a couple of years Will need to discuss repeating carotid ultrasound in her next visit Asymptomatic Taking Plavix daily

## 2015-08-08 NOTE — Assessment & Plan Note (Signed)
Taking pravastatin 20 mg daily-continue

## 2015-08-08 NOTE — Assessment & Plan Note (Signed)
R hip fx s/p fall 04/2014 s/p repair 04/2014 History of T10 compression fracture December 2013. No T8 compression fracture July 2016 DEXA 03/2015 severe osteoporosis-reluctantly agreed to begin Actonel Taking calcium and vitamin D, but not daily-stressed compliance She does experience some nausea with the Actonel monthly so was changed weekly. This is better, but still some mild symptoms Recheck DEXA 03/2016 to monitor effects

## 2015-11-04 ENCOUNTER — Ambulatory Visit (INDEPENDENT_AMBULATORY_CARE_PROVIDER_SITE_OTHER): Payer: Medicare HMO | Admitting: Internal Medicine

## 2015-11-04 ENCOUNTER — Encounter: Payer: Self-pay | Admitting: Internal Medicine

## 2015-11-04 VITALS — BP 180/100 | HR 60 | Temp 97.4°F | Resp 16 | Wt 117.0 lb

## 2015-11-04 DIAGNOSIS — S8991XA Unspecified injury of right lower leg, initial encounter: Secondary | ICD-10-CM | POA: Diagnosis not present

## 2015-11-04 DIAGNOSIS — I10 Essential (primary) hypertension: Secondary | ICD-10-CM | POA: Diagnosis not present

## 2015-11-04 NOTE — Progress Notes (Signed)
Subjective:    Patient ID: Margaret StadeRuth Brooks, female    DOB: 10/18/1921, 82 y.o.   MRN: [id number]  HPI She is here today for an acute visit for right knee pain due to a fall.  2 weeks ago she fell at home. It was the middle of the night and she got up to go to the bathroom. She grabbed her walker and a walker slipped out from under her and she fell. The only injury was to her right knee. She is unsure if the walker was locked. We will be tested walker here today it looks like even with a brace locked the walker was able to move.  She landed on the right knee and has had pain, swelling and bruising in the knee and down to the foot since then. Her symptoms have improved. She is on Plavix and has continued to take it. She wondered if she should stop that now. She has remained active since the injury was not sure if she should stop walking. She has iced the knee and feels that has helped. She has been taking Tylenol and that also helps.  She is able to walk okay without too much pain.  Both her and her daughter feel the swelling, bruising and pain has improved over the past 2 weeks.  Elevated blood pressure: She did take her medication today. She can check her blood pressure at home, but does not monitor it regularly. She states a long history of her blood pressure being elevated when she first goes to a doctor's office or if she is nervous/anxious about something. She states she was anxious today. She denies any headaches or lightheadedness. Overall she feels well except for the leg.       Medications and allergies reviewed with patient and updated if appropriate.  Patient Active Problem List   Diagnosis Date Noted  . Carotid artery disease (HCC) 08/08/2015  . Compression fracture of thoracic vertebra (HCC) 12/03/2014  . Intractable back pain 11/30/2014  . Hyperglycemia 07/10/2013  . Osteoporosis with fracture   . SEBACEOUS CYST 10/12/2010  . MACULAR DEGENERATION 07/13/2010  . CEREBRAL  EMBOLISM, WITH INFARCTION 07/13/2010  . SUBCLAVIAN STEAL SYNDROME 07/13/2010  . PERIPHERAL VASCULAR DISEASE 07/13/2010  . ARTHRITIS 07/13/2010  . Hyperlipidemia 07/12/2010  . Essential hypertension 07/12/2010  . Coronary atherosclerosis 07/12/2010    Current Outpatient Prescriptions on File Prior to Visit  Medication Sig Dispense Refill  . atenolol (TENORMIN) 25 MG tablet TAKE 1 TABLET BY MOUTH DAILY . TAKE WITH 50 MG TABLET FOR A TOTAL OF 75 MG 90 tablet 3  . atenolol (TENORMIN) 50 MG tablet TAKE 1 TABLET BY MOUTH DAILY . TAKE WITH 25 MG TABLET FOR A TOTAL OF 75 MG. 90 tablet 3  . Calcium Citrate-Vitamin D (CALCIUM CITRATE PETITE/VIT D) 200-250 MG-UNIT TABS Take 2 tablets by mouth 2 (two) times daily. 120 tablet   . Cholecalciferol (VITAMIN D3) 1000 units CAPS Takes one tablet daily 60 capsule   . clopidogrel (PLAVIX) 75 MG tablet Take 1 tablet (75 mg total) by mouth daily. 90 tablet 2  . pravastatin (PRAVACHOL) 20 MG tablet Take 1 tablet (20 mg total) by mouth daily. 90 tablet 2  . risedronate (ACTONEL) 35 MG tablet Take 1 tablet (35 mg total) by mouth every 7 (seven) days. with water on empty stomach, nothing by mouth or lie down for next 30 minutes. 12 tablet 3   No current facility-administered medications on file prior to visit.  Past Medical History  Diagnosis Date  . ARTHRITIS   . 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 CORONARY ARTERY DISEASE   . MACULAR DEGENERATION   . PERIPHERAL VASCULAR DISEASE   . Sebaceous cyst     L chin and R neck  . Subclavian steal syndrome   . HYPERTENSION   . HYPERLIPIDEMIA   . Cyst of breast 2007    cyst on breast  . 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 Tonsillectomy    . Sabacious cyst removed from breast  2011  . Breast surgery  2011    sebacious cyst removal   . Hip pinning Right 04/2014    Social  History   Social History  . Marital Status: Widowed    Spouse Name: N/A  . Number of Children: N/A  . Years of Education: N/A   Occupational History  . 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 Smoking status: Former Smoker -- 1.00 packs/day for 61 years    Quit date: 09/29/2009  . Smokeless tobacco: Never Used  . Alcohol Use: 0.0 oz/week    0 Standard drinks or equivalent per week     Comment: Occasional  . Drug Use: No  . Sexual Activity: No   Other Topics Concern  . None   Social History Narrative   [place]ives in [place]. Moved from [place] in the fall of 2011 to be near daughter    Family History  Problem Relation Age of Onset  . Heart disease Other     Parent  . Miscarriages / Stillbirths Other   . Heart attack Father   . Cancer Mother   . Hyperlipidemia Brother   . Hypertension Brother   . Stroke Brother     Review of Systems  Constitutional: Negative for fever and chills.  Respiratory: Negative for shortness of breath.   Cardiovascular: Positive for leg swelling. Negative for chest pain and palpitations.  Skin: Positive for color change (bruising in right leg).  Neurological: Negative for dizziness, light-headedness and headaches.       Objective:   Filed Vitals:   11/04/15 1138  BP: 240/110  Pulse: 60  Temp: 97.4 F (36.3 C)  Resp: 16   Filed Weights   11/04/15 1138  Weight: 117 lb (53.071 kg)   Repeat blood pressure 180/100   Body mass index is 21.39 kg/(m^2).   Physical Exam  Constitutional: She appears well-developed and well-nourished. No distress.  Musculoskeletal: She exhibits edema (Right leg from knee down the leg including foot ) and tenderness (Mild tenderness right knee, down lower leg and in the ankle).  Right knee large effusion, Full range of motion of the knee and ankle, mild discomfort with palpation of knee and lower leg/ankle; palpable pulses right foot, right foot is warm to touch    Neurological:  Using a walker to ambulate, gait is at baseline; normal sensation right lower leg  Skin: She is not diaphoretic.  Extensive bruising right knee down lateral aspect of right leg and throughout the foot        Assessment & Plan:   Right knee injury associated with pain, bruising, swelling of the knee and right lower leg Her symptoms have improved over the past 2 weeks at this point there is no need to discontinue the  Plavix so she will continue She will continue to monitor the leg and in the closely and if her symptoms worsen or she develops any fever she will let me know She does have a large effusion, but it seems to be improving and I don't think she needs that drained at this point No evidence of compartment syndrome Bruising and swelling improving Continue to ice the knee Continue Tylenol Continue to ambulate as normal Continue the heating pad to lower leg She will have her walker checked to make sure the brakes are working properly  See Problem List for Assessment and Plan of chronic medical problems.

## 2015-11-04 NOTE — Progress Notes (Signed)
Pre visit review using our clinic review tool, if applicable. No additional management support is needed unless otherwise documented below in the visit note. 

## 2015-11-04 NOTE — Patient Instructions (Addendum)
Continue icing the knee. You can use a heating pad on the leg.  Elevate the leg when you are sitting.  Keep active.   Have your walker checked - make sure the brakes are working.   Call if the swelling or pain increases in the leg or if you have a fever.

## 2015-11-04 NOTE — Assessment & Plan Note (Signed)
Blood pressure is typically controlled, but was very elevated here today Repeat was slightly lower, but still too high She did take her medication and she will continue her current medication She will start monitoring her blood pressure regularly at home and let me know if it is truly elevated She states she often has whitecoat hypertension

## 2015-11-12 ENCOUNTER — Telehealth: Payer: Self-pay | Admitting: Emergency Medicine

## 2015-11-12 NOTE — Telephone Encounter (Signed)
Pt called in to inquire about a home health referral. Pt is going to fax over paper work

## 2015-11-23 ENCOUNTER — Telehealth: Payer: Self-pay | Admitting: Emergency Medicine

## 2015-11-23 DIAGNOSIS — M810 Age-related osteoporosis without current pathological fracture: Secondary | ICD-10-CM

## 2015-11-23 NOTE — Telephone Encounter (Signed)
ok 

## 2015-11-23 NOTE — Telephone Encounter (Signed)
Received call from [facility]. Would like Verbal orders to see pt 2xs week for 3 weeks, and 1x week for 3 weeks. Would also like an RX for new 4 wheeled walker.    [facility]re[facility] at [phone]   Call back for homehealth [phone] Jae Dire([place] [facility]

## 2015-11-24 NOTE — Telephone Encounter (Signed)
LVM for [name] giving Verbal orders.

## 2015-11-24 NOTE — Telephone Encounter (Signed)
RX for 4 wheeled walker has been faxed.

## 2015-11-25 ENCOUNTER — Emergency Department (HOSPITAL_COMMUNITY): Payer: Medicare HMO | Admitting: Anesthesiology

## 2015-11-25 ENCOUNTER — Emergency Department (HOSPITAL_COMMUNITY): Payer: Medicare HMO

## 2015-11-25 ENCOUNTER — Encounter (HOSPITAL_COMMUNITY)
Admission: EM | Disposition: A | Discharge status: Home / Self Care | Payer: Self-pay | Source: Home / Self Care | Attending: Emergency Medicine

## 2015-11-25 ENCOUNTER — Emergency Department (HOSPITAL_COMMUNITY)
Admission: EM | Admit: 2015-11-25 | Discharge: 2015-11-26 | Disposition: A | Discharge status: Home / Self Care | Payer: Medicare HMO | Attending: Emergency Medicine | Admitting: Emergency Medicine

## 2015-11-25 ENCOUNTER — Encounter (HOSPITAL_COMMUNITY): Payer: Self-pay | Admitting: Emergency Medicine

## 2015-11-25 DIAGNOSIS — I10 Essential (primary) hypertension: Secondary | ICD-10-CM | POA: Diagnosis not present

## 2015-11-25 DIAGNOSIS — X58XXXA Exposure to other specified factors, initial encounter: Secondary | ICD-10-CM | POA: Diagnosis not present

## 2015-11-25 DIAGNOSIS — I739 Peripheral vascular disease, unspecified: Secondary | ICD-10-CM | POA: Diagnosis not present

## 2015-11-25 DIAGNOSIS — Z87891 Personal history of nicotine dependence: Secondary | ICD-10-CM | POA: Insufficient documentation

## 2015-11-25 DIAGNOSIS — Z79899 Other long term (current) drug therapy: Secondary | ICD-10-CM | POA: Insufficient documentation

## 2015-11-25 DIAGNOSIS — R079 Chest pain, unspecified: Secondary | ICD-10-CM | POA: Diagnosis not present

## 2015-11-25 DIAGNOSIS — Z7902 Long term (current) use of antithrombotics/antiplatelets: Secondary | ICD-10-CM | POA: Insufficient documentation

## 2015-11-25 DIAGNOSIS — T18128A Food in esophagus causing other injury, initial encounter: Secondary | ICD-10-CM | POA: Insufficient documentation

## 2015-11-25 DIAGNOSIS — Z8673 Personal history of transient ischemic attack (TIA), and cerebral infarction without residual deficits: Secondary | ICD-10-CM | POA: Diagnosis not present

## 2015-11-25 DIAGNOSIS — I2510 Atherosclerotic heart disease of native coronary artery without angina pectoris: Secondary | ICD-10-CM | POA: Insufficient documentation

## 2015-11-25 DIAGNOSIS — E785 Hyperlipidemia, unspecified: Secondary | ICD-10-CM | POA: Diagnosis not present

## 2015-11-25 DIAGNOSIS — M549 Dorsalgia, unspecified: Secondary | ICD-10-CM | POA: Diagnosis not present

## 2015-11-25 DIAGNOSIS — K228 Other specified diseases of esophagus: Secondary | ICD-10-CM | POA: Diagnosis not present

## 2015-11-25 DIAGNOSIS — H3530 Unspecified macular degeneration: Secondary | ICD-10-CM | POA: Diagnosis not present

## 2015-11-25 DIAGNOSIS — M810 Age-related osteoporosis without current pathological fracture: Secondary | ICD-10-CM | POA: Diagnosis not present

## 2015-11-25 HISTORY — PX: ESOPHAGOGASTRODUODENOSCOPY: SHX5428

## 2015-11-25 LAB — BASIC METABOLIC PANEL
Anion gap: 9 (ref 5–15)
BUN: 28 mg/dL — ABNORMAL HIGH (ref 6–20)
CALCIUM: 9.1 mg/dL (ref 8.9–10.3)
CHLORIDE: 108 mmol/L (ref 101–111)
CO2: 25 mmol/L (ref 22–32)
CREATININE: 0.77 mg/dL (ref 0.44–1.00)
GFR calc non Af Amer: >60 mL/min (ref >60)
Glucose, Bld: 113 mg/dL — ABNORMAL HIGH (ref 65–99)
Potassium: 4.3 mmol/L (ref 3.5–5.1)
SODIUM: 142 mmol/L (ref 135–145)

## 2015-11-25 LAB — CBC
HCT: 40.4 % (ref 36.0–46.0)
Hemoglobin: 13.3 g/dL (ref 12.0–15.0)
MCH: 29.6 pg (ref 26.0–34.0)
MCHC: 32.9 g/dL (ref 30.0–36.0)
MCV: 89.8 fL (ref 78.0–100.0)
PLATELETS: 357 10*3/uL (ref 150–400)
RBC: 4.5 MIL/uL (ref 3.87–5.11)
RDW: 15.2 % (ref 11.5–15.5)
WBC: 10.4 10*3/uL (ref 4.0–10.5)

## 2015-11-25 LAB — I-STAT TROPONIN, ED: TROPONIN I, POC: 0 ng/mL (ref 0.00–0.08)

## 2015-11-25 SURGERY — EGD (ESOPHAGOGASTRODUODENOSCOPY)
Anesthesia: General

## 2015-11-25 MED ORDER — PHENYLEPHRINE HCL 10 MG/ML IJ SOLN
INTRAMUSCULAR | Status: DC | PRN
  Administered 2015-11-25: 120 ug via INTRAVENOUS

## 2015-11-25 MED ORDER — PROPOFOL 10 MG/ML IV BOLUS
INTRAVENOUS | Status: AC
  Filled 2015-11-25: qty 20

## 2015-11-25 MED ORDER — GLUCAGON HCL RDNA (DIAGNOSTIC) 1 MG IJ SOLR
1.0000 mg | Freq: Once | INTRAMUSCULAR | Status: DC
  Filled 2015-11-25: qty 1

## 2015-11-25 MED ORDER — LIDOCAINE HCL (CARDIAC) 20 MG/ML IV SOLN
INTRAVENOUS | Status: DC | PRN
  Administered 2015-11-25: 50 mg via INTRAVENOUS

## 2015-11-25 MED ORDER — SUCCINYLCHOLINE CHLORIDE 20 MG/ML IJ SOLN
INTRAMUSCULAR | Status: DC | PRN
  Administered 2015-11-25: 100 mg via INTRAVENOUS

## 2015-11-25 MED ORDER — PROPOFOL 10 MG/ML IV BOLUS
INTRAVENOUS | Status: DC | PRN
  Administered 2015-11-25: 100 mg via INTRAVENOUS

## 2015-11-25 MED ORDER — LACTATED RINGERS IV SOLN
INTRAVENOUS | Status: DC
Start: 2015-11-25 — End: 2015-11-26
  Administered 2015-11-25: via INTRAVENOUS

## 2015-11-25 MED ORDER — ONDANSETRON HCL 4 MG/2ML IJ SOLN
INTRAMUSCULAR | Status: AC
  Filled 2015-11-25: qty 2

## 2015-11-25 MED ORDER — LIDOCAINE HCL (CARDIAC) 20 MG/ML IV SOLN
INTRAVENOUS | Status: AC
  Filled 2015-11-25: qty 5

## 2015-11-25 NOTE — ED Provider Notes (Signed)
CSN: [id number]     Arrival date & time 11/25/15  1751 History   First MD Initiated Contact with Patient 11/25/15 2127     Chief Complaint  Patient presents with  . Chest Pain  . Back Pain     (Consider location/radiation/quality/duration/timing/severity/associated sxs/prior Treatment) HPI Comments: Patient presents with chest pain. She states she was eating some chicken tenders about 2:00 this afternoon and felt like a piece of food got stuck in her esophagus. She developed some discomfort in her chest. Since that time she hasn't been able to swallow anything down. Any time she swallows even sips of water she spits it back up. She currently denies any chest pain. She does have some pain in her right upper back which started when she was sitting on the waiting room. She feels it's from sitting out in the waiting room. It's worse with movement. She denies any nausea. No cough or chest congestion. No prior history of food impactions.  Patient is a 82 y.o. female presenting with chest pain and back pain.  Chest Pain Associated symptoms: back pain and vomiting   Associated symptoms: no abdominal pain, no cough, no diaphoresis, no dizziness, no fatigue, no fever, no headache, no nausea, no numbness, no shortness of breath and no weakness   Back Pain Associated symptoms: chest pain   Associated symptoms: no abdominal pain, no fever, no headaches, no numbness and no weakness     Past Medical History  Diagnosis Date  . ARTHRITIS   . 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 CORONARY ARTERY DISEASE   . MACULAR DEGENERATION   . PERIPHERAL VASCULAR DISEASE   . Sebaceous cyst     L chin and R neck  . Subclavian steal syndrome   . HYPERTENSION   . HYPERLIPIDEMIA   . Cyst of breast 2007    cyst on breast  . 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 Tonsillectomy    .  Sabacious cyst removed from breast  2011  . Breast surgery  2011    sebacious cyst removal   . Hip pinning Right 04/2014   Family History  Problem Relation Age of Onset  . Heart disease Other     Parent  . Miscarriages / Stillbirths Other   . Heart attack Father   . Cancer Mother   . Hyperlipidemia Brother   . Hypertension Brother   . Stroke Brother    Social History  Substance Use Topics  . Smoking status: Former Smoker -- 1.00 packs/day for 61 years    Quit date: 09/29/2009  . Smokeless tobacco: Never Used  . Alcohol Use: 0.0 oz/week    0 Standard drinks or equivalent per week     Comment: Occasional   OB History    No data available     Review of Systems  Constitutional: Negative for fever, chills, diaphoresis and fatigue.  HENT: Negative for congestion, rhinorrhea and sneezing.   Eyes: Negative.   Respiratory: Negative for cough, chest tightness and shortness of breath.   Cardiovascular: Positive for chest pain. Negative for leg swelling.  Gastrointestinal: Positive for vomiting. Negative for nausea, abdominal pain, diarrhea and blood in stool.  Genitourinary: Negative for frequency, hematuria, flank pain and difficulty urinating.  Musculoskeletal: Positive for back pain. Negative for arthralgias.  Skin: Negative for rash.  Neurological: Negative for dizziness,  speech difficulty, weakness, numbness and headaches.      Allergies  Codeine; Penicillins; and Sulfonamide derivatives  Home Medications   Prior to Admission medications   Medication Sig Start Date End Date Taking? Authorizing Provider  atenolol (TENORMIN) 25 MG tablet TAKE 1 TABLET BY MOUTH DAILY . TAKE WITH 50 MG TABLET FOR A TOTAL OF 75 MG Patient taking differently: TAKE 25 MG BY MOUTH DAILY . TAKE WITH 50 MG TABLET FOR A TOTAL OF 75 MG 07/15/15  Yes Newt Lukes, MD  atenolol (TENORMIN) 50 MG tablet TAKE 1 TABLET BY MOUTH DAILY . TAKE WITH 25 MG TABLET FOR A TOTAL OF 75 MG. Patient taking  differently: TAKE 50 MG BY MOUTH DAILY . TAKE WITH 25 MG TABLET FOR A TOTAL OF 75 MG. 07/15/15  Yes Newt Lukes, MD  Calcium Citrate-Vitamin D (CALCIUM CITRATE PETITE/VIT D) 200-250 MG-UNIT TABS Take 2 tablets by mouth 2 (two) times daily. 08/07/15  Yes Pincus Sanes, MD  Cholecalciferol (VITAMIN D3) 1000 units CAPS Takes one tablet daily Patient taking differently: Take 1,000 Units by mouth daily.  08/07/15  Yes Pincus Sanes, MD  clopidogrel (PLAVIX) 75 MG tablet Take 1 tablet (75 mg total) by mouth daily. 02/04/15  Yes Newt Lukes, MD  pravastatin (PRAVACHOL) 20 MG tablet Take 1 tablet (20 mg total) by mouth daily. 02/04/15  Yes Newt Lukes, MD  risedronate (ACTONEL) 35 MG tablet Take 1 tablet (35 mg total) by mouth every 7 (seven) days. with water on empty stomach, nothing by mouth or lie down for next 30 minutes. 08/07/15  Yes Pincus Sanes, MD   BP 148/86 mmHg  Pulse 77  Temp(Src) 97.3 F (36.3 C) (Oral)  Resp 21  SpO2 96% Physical Exam  Constitutional: She is oriented to person, place, and time. She appears well-developed and well-nourished.  HENT:  Head: Normocephalic and atraumatic.  Eyes: Pupils are equal, round, and reactive to light.  Neck: Normal range of motion. Neck supple.  Cardiovascular: Normal rate, regular rhythm and normal heart sounds.   Pulmonary/Chest: Effort normal and breath sounds normal. No respiratory distress. She has no wheezes. She has no rales. She exhibits no tenderness.  Abdominal: Soft. Bowel sounds are normal. There is no tenderness. There is no rebound and no guarding.  Musculoskeletal: Normal range of motion. She exhibits no edema.  Lymphadenopathy:    She has no cervical adenopathy.  Neurological: She is alert and oriented to person, place, and time.  Skin: Skin is warm and dry. No rash noted.  Psychiatric: She has a normal mood and affect.    ED Course  Procedures (including critical care time) Labs Review Labs Reviewed  BASIC  METABOLIC PANEL - Abnormal; Notable for the following:    Glucose, Bld 113 (*)    BUN 28 (*)    All other components within normal limits  CBC  I-STAT TROPOININ, ED    Imaging Review Dg Chest 2 View  11/25/2015  CLINICAL DATA:  Sternal chest pain.  Radiates to the mid back. EXAM: CHEST  2 VIEW COMPARISON:  11/30/2014 FINDINGS: There is left basilar pleural plaque. There is no focal parenchymal opacity. There is no pleural effusion or pneumothorax. The heart and mediastinal contours are unremarkable. The osseous structures are unremarkable. IMPRESSION: No active cardiopulmonary disease. Electronically Signed   By: Elige Ko   On: 11/25/2015 19:18   I have personally reviewed and evaluated these images and lab results as part of  my medical decision-making.   EKG Interpretation   Date/Time:  Wednesday November 25 2015 18:36:57 EDT Ventricular Rate:  74 PR Interval:  180 QRS Duration: 80 QT Interval:  376 QTC Calculation: 417 R Axis:   -16 Text Interpretation:  Sinus rhythm Borderline left axis deviation Baseline  wander in lead(s) V6 since last tracing no significant change maybe  slightly more prominent ST depression in V4-V6 Confirmed by Shamika Pedregon  MD,  Orion Mole ([id number]) on 11/25/2015 6:43:25 PM      MDM   Final diagnoses:  Food impaction of esophagus, initial encounter    Patient presents with a food impaction. She's unable to swallow any liquids down. I attempted to have her drink a little bit of water in the ED and she spit it right back up. She has no ongoing chest pain I feel like her chest pain was related to the food impaction. She has back pain which started since she's been in the ED and seems to be musculoskeletal in nature. She states that she's never seen a gastroenterologist in the past. I spoke with Dr. Matthias HughsBuccini who will take the pt to the endoscopy lab.    Rolan BuccoMelanie Kotaro Buer, MD 11/25/15 2214

## 2015-11-25 NOTE — Anesthesia Preprocedure Evaluation (Addendum)
Anesthesia Evaluation  Patient identified by MRN, date of birth, ID band Patient awake    Reviewed: Allergy & Precautions, NPO status , Patient's Chart, lab work & pertinent test results  History of Anesthesia Complications (+) PONV  Airway Mallampati: II  TM Distance: <3 FB Neck ROM: Full    Dental  (+) Dental Advisory Given, Missing   Pulmonary former smoker,    Pulmonary exam normal breath sounds clear to auscultation       Cardiovascular hypertension, + CAD and + Peripheral Vascular Disease  Normal cardiovascular exam Rhythm:Regular Rate:Normal     Neuro/Psych CVA negative psych ROS   GI/Hepatic negative GI ROS, Neg liver ROS,   Endo/Other  negative endocrine ROS  Renal/GU negative Renal ROS     Musculoskeletal T8 compression fracture evaluated by orthospine July 2016   Abdominal   Peds  Hematology negative hematology ROS (+)   Anesthesia Other Findings Day of surgery medications reviewed with the patient.  Reproductive/Obstetrics                           Anesthesia Physical Anesthesia Plan  ASA: III and emergent  Anesthesia Plan: General   Post-op Pain Management:    Induction: Intravenous, Rapid sequence and Cricoid pressure planned  Airway Management Planned: Oral ETT  Additional Equipment:   Intra-op Plan:   Post-operative Plan: Extubation in OR  Informed Consent: I have reviewed the patients History and Physical, chart, labs and discussed the procedure including the risks, benefits and alternatives for the proposed anesthesia with the patient or authorized representative who has indicated his/her understanding and acceptance.   Dental advisory given  Plan Discussed with: CRNA  Anesthesia Plan Comments: (Risks/benefits of general anesthesia discussed with patient including risk of damage to teeth, lips, gum, and tongue, nausea/vomiting, allergic reactions to  medications, and the possibility of heart attack, stroke and death.  All patient questions answered.  Patient wishes to proceed.)        Anesthesia Quick Evaluation

## 2015-11-25 NOTE — H&P (Signed)
[name] is an 82 y.o. female.   Chief Complaint: Dysphagia HPI: This 82 year old female in reasonable general health was eating chicken tenders this afternoon, and became unable to swallow further. She presented to the emergency room, where attempts to swallow water led to regurgitation, leading to the clinical impression of the food impaction.  Past Medical History  Diagnosis Date  . ARTHRITIS   . 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 CORONARY ARTERY DISEASE   . MACULAR DEGENERATION   . PERIPHERAL VASCULAR DISEASE   . Sebaceous cyst     L chin and R neck  . Subclavian steal syndrome   . HYPERTENSION   . HYPERLIPIDEMIA   . Cyst of breast 2007    cyst on breast  . 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 Tonsillectomy    . Sabacious cyst removed from breast  2011  . Breast surgery  2011    sebacious cyst removal   . Hip pinning Right 04/2014    Family History  Problem Relation Age of Onset  . Heart disease Other     Parent  . Miscarriages / Stillbirths Other   . Heart attack Father   . Cancer Mother   . Hyperlipidemia Brother   . Hypertension Brother   . Stroke Brother    Social History:  reports that she quit smoking about 6 years ago. She has never used smokeless tobacco. She reports that she drinks alcohol. She reports that she does not use illicit drugs.  Allergies:  Allergies  Allergen Reactions  . Codeine     Unknown  . Penicillins Other (See Comments)    Reaction: unknown  Has patient had a PCN reaction causing immediate rash, facial/tongue/throat swelling, SOB or lightheadedness with hypotension: n/a Has patient had a PCN reaction causing severe rash involving mucus membranes or skin necrosis: n/a Has patient had a PCN reaction that required hospitalization: n/a Has patient had a PCN reaction occurring within the last 10 years:  n/a If all of the above answers are "NO", then may proceed with Cephalosporin use.   . Sulfonamide Derivatives     Pt states that it was so long ago that she does not remember what happened.     Medications Prior to Admission  Medication Sig Dispense Refill  . atenolol (TENORMIN) 25 MG tablet TAKE 1 TABLET BY MOUTH DAILY . TAKE WITH 50 MG TABLET FOR A TOTAL OF 75 MG (Patient taking differently: TAKE 25 MG BY MOUTH DAILY . TAKE WITH 50 MG TABLET FOR A TOTAL OF 75 MG) 90 tablet 3  . atenolol (TENORMIN) 50 MG tablet TAKE 1 TABLET BY MOUTH DAILY . TAKE WITH 25 MG TABLET FOR A TOTAL OF 75 MG. (Patient taking differently: TAKE 50 MG BY MOUTH DAILY . TAKE WITH 25 MG TABLET FOR A TOTAL OF 75 MG.) 90 tablet 3  . Calcium Citrate-Vitamin D (CALCIUM CITRATE PETITE/VIT D) 200-250 MG-UNIT TABS Take 2 tablets by mouth 2 (two) times daily. 120 tablet   . Cholecalciferol (VITAMIN D3) 1000 units CAPS Takes one tablet daily (Patient taking differently: Take 1,000 Units by mouth daily. ) 60 capsule   . clopidogrel (PLAVIX) 75 MG tablet Take 1 tablet (75 mg total) by mouth daily. 90 tablet 2  . pravastatin (PRAVACHOL) 20 MG tablet Take 1 tablet (20 mg total) by mouth  daily. 90 tablet 2  . risedronate (ACTONEL) 35 MG tablet Take 1 tablet (35 mg total) by mouth every 7 (seven) days. with water on empty stomach, nothing by mouth or lie down for next 30 minutes. 12 tablet 3    Results for orders placed or performed during the hospital encounter of 11/25/15 (from the past 48 hour(s))  Basic metabolic panel     Status: Abnormal   Collection Time: 11/25/15  6:42 PM  Result Value Ref Range   Sodium 142 135 - 145 mmol/L   Potassium 4.3 3.5 - 5.1 mmol/L   Chloride 108 101 - 111 mmol/L   CO2 25 22 - 32 mmol/L   Glucose, Bld 113 (H) 65 - 99 mg/dL   BUN 28 (H) 6 - 20 mg/dL   Creatinine, Ser 0.77 0.44 - 1.00 mg/dL   Calcium 9.1 8.9 - 10.3 mg/dL   GFR calc non Af Amer >60 >60 mL/min   GFR calc Af Amer >60 >60 mL/min     Comment: (NOTE) The eGFR has been calculated using the CKD EPI equation. This calculation has not been validated in all clinical situations. eGFR's persistently <60 mL/min signify possible Chronic Kidney Disease.    Anion gap 9 5 - 15    Comment: Performed at [facility]  CBC     Status: None   Collection Time: 11/25/15  6:42 PM  Result Value Ref Range   WBC 10.4 4.0 - 10.5 K/uL   RBC 4.50 3.87 - 5.11 MIL/uL   Hemoglobin 13.3 12.0 - 15.0 g/dL   HCT 40.4 36.0 - 46.0 %   MCV 89.8 78.0 - 100.0 fL   MCH 29.6 26.0 - 34.0 pg   MCHC 32.9 30.0 - 36.0 g/dL   RDW 15.2 11.5 - 15.5 %   Platelets 357 150 - 400 K/uL  I-stat troponin, ED     Status: None   Collection Time: 11/25/15  6:59 PM  Result Value Ref Range   Troponin i, poc 0.00 0.00 - 0.08 ng/mL   Comment 3            Comment: Due to the release kinetics of cTnI, a negative result within the first hours of the onset of symptoms does not rule out myocardial infarction with certainty. If myocardial infarction is still suspected, repeat the test at appropriate intervals.    Dg Chest 2 View  11/25/2015  CLINICAL DATA:  Sternal chest pain.  Radiates to the mid back. EXAM: CHEST  2 VIEW COMPARISON:  11/30/2014 FINDINGS: There is left basilar pleural plaque. There is no focal parenchymal opacity. There is no pleural effusion or pneumothorax. The heart and mediastinal contours are unremarkable. The osseous structures are unremarkable. IMPRESSION: No active cardiopulmonary disease. Electronically Signed   By: Kathreen Devoid   On: 11/25/2015 19:18    ROS  denies previous dysphagia issues  Blood pressure 253/94, pulse 74, temperature 97.3 F (36.3 C), temperature source Oral, resp. rate 23, height 5' 2"  (1.575 m), weight 53.071 kg (117 lb), SpO2 98 %. Physical Exam  Well-preserved, cognitively intact Caucasian female. Chest clear, oropharynx benign, heart without murmur or arrhythmia, abdomen without mass or  tenderness   Assessment/Plan Proceed to endoscopic evaluation under general endotracheal intubation for the purposes of airway protection. Purpose and risks reviewed with patient and daughter and they are agreeable to proceed.  Cleotis Nipper, MD 11/25/2015, 11:42 PM

## 2015-11-25 NOTE — ED Notes (Signed)
Patient presents for sternal CP starting earlier at lunch today, radiating to mid back. Patient currently rates pain 3/10, describes as uncomfortable. Denies SOB, N/V, diaphoresis, weakness.

## 2015-11-25 NOTE — Anesthesia Procedure Notes (Signed)
Procedure Name: Intubation Date/Time: 11/25/2015 11:46 PM Performed by: Enriqueta ShutterWILLIFORD, Jahkeem Kurka D Pre-anesthesia Checklist: Patient identified, Emergency Drugs available, Suction available and Patient being monitored Patient Re-evaluated:Patient Re-evaluated prior to inductionOxygen Delivery Method: Circle System Utilized Preoxygenation: Pre-oxygenation with 100% oxygen Intubation Type: IV induction, Rapid sequence and Cricoid Pressure applied Laryngoscope Size: Mac and 3 Grade View: Grade I Tube type: Oral Tube size: 7.5 mm Number of attempts: 1 Airway Equipment and Method: Stylet and Oral airway Placement Confirmation: ETT inserted through vocal cords under direct vision,  positive ETCO2 and breath sounds checked- equal and bilateral Secured at: 21 cm Tube secured with: Tape Dental Injury: Teeth and Oropharynx as per pre-operative assessment

## 2015-11-25 NOTE — ED Notes (Signed)
Pt transported to Endoscopy 

## 2015-11-26 ENCOUNTER — Encounter (HOSPITAL_COMMUNITY): Payer: Self-pay | Admitting: Gastroenterology

## 2015-11-26 MED ORDER — PHENYLEPHRINE 40 MCG/ML (10ML) SYRINGE FOR IV PUSH (FOR BLOOD PRESSURE SUPPORT)
PREFILLED_SYRINGE | INTRAVENOUS | Status: AC
  Filled 2015-11-26: qty 10

## 2015-11-26 MED ORDER — ONDANSETRON HCL 4 MG/2ML IJ SOLN
INTRAMUSCULAR | Status: DC | PRN
  Administered 2015-11-26: 4 mg via INTRAVENOUS

## 2015-11-26 MED ORDER — OMEPRAZOLE 20 MG PO CPDR: 20.0000 mg | DELAYED_RELEASE_CAPSULE | Freq: Every day | ORAL | Status: DC

## 2015-11-26 MED ORDER — MAGIC MOUTHWASH
5.0000 mL | Freq: Once | ORAL | Status: DC
  Filled 2015-11-26: qty 5

## 2015-11-26 NOTE — Transfer of Care (Signed)
Immediate Anesthesia Transfer of Care Note  Patient: Wendall StadeRuth Mcclish  Procedure(s) Performed: Procedure(s): ESOPHAGOGASTRODUODENOSCOPY (EGD) (N/A)  Patient Location: PACU  Anesthesia Type:General  Level of Consciousness: awake, alert  and oriented  Airway & Oxygen Therapy: Patient Spontanous Breathing and Patient connected to face mask oxygen  Post-op Assessment: Report given to RN and Post -op Vital signs reviewed and stable  Post vital signs: Reviewed and stable  Last Vitals:  Filed Vitals:   11/25/15 2336 11/26/15 0032  BP: 253/94 215/108  Pulse: 74 71  Temp:    Resp: 23 22    Last Pain: There were no vitals filed for this visit.       Complications: No apparent anesthesia complications

## 2015-11-26 NOTE — Discharge Instructions (Signed)
See the GI doctor as requested. Clear liquid diet for now.  Clear Liquid Diet A clear liquid diet is a short-term diet that is prescribed to provide the necessary fluid and basic energy you need when you can have nothing else. The clear liquid diet consists of liquids or solids that will become liquid at room temperature. You should be able to see through the liquid. There are many reasons that you may be restricted to clear liquids, such as:  When you have a sudden-onset (acute) condition that occurs before or after surgery.  To help your body slowly get adjusted to food again after a long period when you were unable to have food.  Replacement of fluids when you have a diarrheal disease.  When you are going to have certain exams, such as a colonoscopy, in which instruments are inserted inside your body to look at parts of your digestive system. WHAT CAN I HAVE? A clear liquid diet does not provide all the nutrients you need. It is important to choose a variety of the following items to get as many nutrients as possible:  Vegetable juices that do not have pulp.  Fruit juices and fruit drinks that do not have pulp.  Coffee (regular or decaffeinated), tea, or soda at the discretion of your health care provider.  Clear bouillon, broth, or strained broth-based soups.  High-protein and flavored gelatins.  Sugar or honey.  Ices or frozen ice pops that do not contain milk. If you are not sure whether you can have certain items, you should ask your health care provider. You may also ask your health care provider if there are any other clear liquid options.   This information is not intended to replace advice given to you by your health care provider. Make sure you discuss any questions you have with your health care provider.   Document Released: 07/18/2005 Document Revised: 07/23/2013 Document Reviewed: 06/14/2013 Elsevier Interactive Patient Education [employer]2016 Elsevier Inc.

## 2015-11-26 NOTE — Anesthesia Postprocedure Evaluation (Signed)
Anesthesia Post Note  Patient: [name]  Procedure(s) Performed: Procedure(s) (LRB): ESOPHAGOGASTRODUODENOSCOPY (EGD) (N/A)  Patient location during evaluation: PACU Anesthesia Type: General Level of consciousness: awake and alert Pain management: pain level controlled Vital Signs Assessment: post-procedure vital signs reviewed and stable Respiratory status: spontaneous breathing, nonlabored ventilation, respiratory function stable and patient connected to nasal cannula oxygen Cardiovascular status: blood pressure returned to baseline and stable Postop Assessment: no signs of nausea or vomiting Anesthetic complications: no    Last Vitals:  Filed Vitals:   11/25/15 2336 11/26/15 0032  BP: 253/94 215/108  Pulse: 74 71  Temp:    Resp: 23 22    Last Pain: There were no vitals filed for this visit.               Cecile HearingStephen Edward Turk

## 2015-11-26 NOTE — ED Notes (Signed)
Pt ambulated to restroom using her walker. No distress noted.

## 2015-11-26 NOTE — ED Provider Notes (Signed)
Pt s/p EGD for food impaction. Has ambulated. Will d/c.    Derwood KaplanAnkit Amahd Morino, MD 11/26/15 [phone]

## 2015-11-26 NOTE — ED Notes (Signed)
Bed: WA09 Expected date:  Expected time:  Means of arrival:  Comments: Pt in Endo- returning

## 2015-11-26 NOTE — Op Note (Signed)
[facility] Patient Name: [name] Procedure Date: 11/25/2015 MRN: [id number] Attending MD: Bernette Redbird , MD Date of Birth: 18-Sep-1921 CSN:  Age: 82 Admit Type: Outpatient Procedure:                Upper GI endoscopy Indications:              Foreign body in the esophagus--first episode Providers:                Bernette Redbird, MD, Dow Adolph, RN, Kandice Robinsons, Technician Referring MD:             Dr. Lawerance Bach ([facility]) Medicines:                General Anesthesia Complications:            No immediate complications. Estimated Blood Loss:     Estimated blood loss was minimal. Procedure:                Pre-Anesthesia Assessment:                           - Prior to the procedure, a History and Physical                            was performed, and patient medications and                            allergies were reviewed. The patient's tolerance of                            previous anesthesia was also reviewed. The risks                            and benefits of the procedure and the sedation                            options and risks were discussed with the patient.                            All questions were answered, and informed consent                            was obtained. Prior Anticoagulants: The patient has                            taken Plavix (clopidogrel), last dose was day of                            procedure. ASA Grade Assessment: III - A patient                            with severe systemic disease. After reviewing the  risks and benefits, the patient was deemed in                            satisfactory condition to undergo the procedure.                           After obtaining informed consent, the endoscope was                            passed under direct vision. Throughout the                            procedure, the patient's blood pressure,  pulse, and                            oxygen saturations were monitored continuously. The                            [id number] ([id number]) scope was introduced through the                            mouth, and advanced to the second part of duodenum.                            The upper GI endoscopy was accomplished without                            difficulty. The patient tolerated the procedure                            well. Scope In: Scope Out: Findings:      Food was found in the lower third of the esophagus. Removal of food was       accomplished in a 30-minute procedure, during which we used the Rescue       basket device, the prong-retrieval instrument, the plastic banding-type       hood, and 2 types of snares. In each case, we retrieved small fragments       of food, until finally we were able to push the remaining food bolus       into the stomach.      The distal esophagus was moderately tortuous.      The exam of the esophagus was otherwise normal.      There is no endoscopic evidence of hiatal hernia, stenosis, stricture or       mass in the entire esophagus.      The entire examined stomach was normal.      The cardia and gastric fundus were normal on retroflexion.      The examined duodenum was normal. Impression:               - Food in the lower third of the esophagus. Removal                            was successful.                           -  Tortuous esophagus.                           - Normal stomach.                           - Normal examined duodenum.                           - No discrete cause for food impaction identified;                            this does not appear to be the result of a                            stricture, but rather, her tortuous anatomy,                            possibly with an element of dysmotility. Moderate Sedation:      This patient was sedated with monitored anesthesia care, not moderate        sedation. Recommendation:           - Return to my office at appointment to be                            scheduled to discuss dietary recommendations and                            swallowing strategies in more detail, and to                            discuss possibly doing a barium swallow to further                            define the anatomy and function of the esophagus.                           - Chopped diet.                           - Findings and recommendations discussed at length                            with the patient's daughter.                           - Continue present medications. Procedure Code(s):        --- Professional ---                           [phone], Esophagogastroduodenoscopy, flexible,                            transoral; with removal of foreign body(s) Diagnosis Code(s):        --- Professional ---  Z61.096ET18.128A, Food in esophagus causing other injury,                            initial encounter CPT copyright 2016 American Medical Association. All rights reserved. The codes documented in this report are preliminary and upon coder review may  be revised to meet current compliance requirements. Bernette Redbirdobert Kamora Vossler, MD  Bernette Redbirdobert Gustavo Meditz, MD 11/26/2015 12:47:28 AM This report has been signed electronically. Number of Addenda: 0

## 2015-12-04 ENCOUNTER — Telehealth: Payer: Self-pay | Admitting: Emergency Medicine

## 2016-01-22 NOTE — Telephone Encounter (Signed)
A user error has taken place. Not sure why encounter still open since may. Closing encounter.../lmb 

## 2016-01-26 ENCOUNTER — Other Ambulatory Visit: Payer: Self-pay | Admitting: *Deleted

## 2016-01-26 MED ORDER — CLOPIDOGREL BISULFATE 75 MG PO TABS: 75.0000 mg | ORAL_TABLET | Freq: Every day | ORAL | Status: AC

## 2016-02-04 ENCOUNTER — Telehealth: Payer: Self-pay

## 2016-02-04 NOTE — Telephone Encounter (Signed)
Patient is on the list for [employer] 2017 and may be a good candidate for an AWV in 2017. Please let me know if/when appt is scheduled.   

## 2016-02-05 ENCOUNTER — Encounter: Payer: Self-pay | Admitting: Internal Medicine

## 2016-02-05 ENCOUNTER — Other Ambulatory Visit (INDEPENDENT_AMBULATORY_CARE_PROVIDER_SITE_OTHER): Payer: Medicare HMO

## 2016-02-05 ENCOUNTER — Ambulatory Visit (INDEPENDENT_AMBULATORY_CARE_PROVIDER_SITE_OTHER): Payer: Medicare HMO | Admitting: Internal Medicine

## 2016-02-05 VITALS — BP 144/96 | HR 64 | Temp 97.5°F | Resp 16 | Ht 62.0 in | Wt 119.0 lb

## 2016-02-05 DIAGNOSIS — I10 Essential (primary) hypertension: Secondary | ICD-10-CM

## 2016-02-05 DIAGNOSIS — M1711 Unilateral primary osteoarthritis, right knee: Secondary | ICD-10-CM

## 2016-02-05 DIAGNOSIS — M8080XS Other osteoporosis with current pathological fracture, unspecified site, sequela: Secondary | ICD-10-CM

## 2016-02-05 DIAGNOSIS — I739 Peripheral vascular disease, unspecified: Secondary | ICD-10-CM

## 2016-02-05 DIAGNOSIS — R739 Hyperglycemia, unspecified: Secondary | ICD-10-CM

## 2016-02-05 DIAGNOSIS — E785 Hyperlipidemia, unspecified: Secondary | ICD-10-CM

## 2016-02-05 DIAGNOSIS — M2170 Unequal limb length (acquired), unspecified site: Secondary | ICD-10-CM

## 2016-02-05 DIAGNOSIS — I779 Disorder of arteries and arterioles, unspecified: Secondary | ICD-10-CM

## 2016-02-05 LAB — CBC WITH DIFFERENTIAL/PLATELET
BASOS ABS: 0 10*3/uL (ref 0.0–0.1)
BASOS PCT: 0.4 % (ref 0.0–3.0)
EOS ABS: 0.1 10*3/uL (ref 0.0–0.7)
Eosinophils Relative: 1.3 % (ref 0.0–5.0)
HEMATOCRIT: 39.6 % (ref 36.0–46.0)
HEMOGLOBIN: 12.8 g/dL (ref 12.0–15.0)
LYMPHS PCT: 20.1 % (ref 12.0–46.0)
Lymphs Abs: 1.8 10*3/uL (ref 0.7–4.0)
MCHC: 32.2 g/dL (ref 30.0–36.0)
MCV: 88.7 fl (ref 78.0–100.0)
MONO ABS: 0.6 10*3/uL (ref 0.1–1.0)
Monocytes Relative: 6.2 % (ref 3.0–12.0)
Neutro Abs: 6.4 10*3/uL (ref 1.4–7.7)
Neutrophils Relative %: 72 % (ref 43.0–77.0)
Platelets: 356 10*3/uL (ref 150.0–400.0)
RBC: 4.47 Mil/uL (ref 3.87–5.11)
RDW: 15.6 % — AB (ref 11.5–15.5)
WBC: 8.9 10*3/uL (ref 4.0–10.5)

## 2016-02-05 LAB — COMPREHENSIVE METABOLIC PANEL
ALBUMIN: 4.3 g/dL (ref 3.5–5.2)
ALT: 7 U/L (ref 0–35)
AST: 12 U/L (ref 0–37)
Alkaline Phosphatase: 96 U/L (ref 39–117)
BILIRUBIN TOTAL: 0.5 mg/dL (ref 0.2–1.2)
BUN: 23 mg/dL (ref 6–23)
CALCIUM: 9.5 mg/dL (ref 8.4–10.5)
CHLORIDE: 104 meq/L (ref 96–112)
CO2: 32 mEq/L (ref 19–32)
CREATININE: 0.71 mg/dL (ref 0.40–1.20)
GFR: 81.82 mL/min (ref >60.00)
Glucose, Bld: 102 mg/dL — ABNORMAL HIGH (ref 70–99)
Potassium: 4.1 mEq/L (ref 3.5–5.1)
SODIUM: 140 meq/L (ref 135–145)
TOTAL PROTEIN: 7.5 g/dL (ref 6.0–8.3)

## 2016-02-05 LAB — TSH: TSH: 2.53 u[IU]/mL (ref 0.35–4.50)

## 2016-02-05 LAB — LIPID PANEL
CHOLESTEROL: 182 mg/dL (ref 0–200)
HDL: 56.7 mg/dL (ref >39.00)
LDL CALC: 105 mg/dL — AB (ref 0–99)
NonHDL: 125.16
TRIGLYCERIDES: 100 mg/dL (ref 0.0–149.0)
Total CHOL/HDL Ratio: 3
VLDL: 20 mg/dL (ref 0.0–40.0)

## 2016-02-05 LAB — HEMOGLOBIN A1C: HEMOGLOBIN A1C: 6.1 % (ref 4.6–6.5)

## 2016-02-05 NOTE — Progress Notes (Signed)
Pre visit review using our clinic review tool, if applicable. No additional management support is needed unless otherwise documented below in the visit note. 

## 2016-02-05 NOTE — Assessment & Plan Note (Signed)
BP elevated here, but controlled elsewhere ? whitecoat htn Continue current medication at current dose cmp

## 2016-02-05 NOTE — Assessment & Plan Note (Signed)
Discussed rechecking carotid US - she deferred - does not want to have it done Continue plavix

## 2016-02-05 NOTE — Assessment & Plan Note (Signed)
Continue tylenol Can try otc topical pain meds

## 2016-02-05 NOTE — Assessment & Plan Note (Signed)
Check lipids On pravastatin

## 2016-02-05 NOTE — Patient Instructions (Addendum)
  Test(s) ordered today. Your results will be released to MyChart (or called to you) after review, usually within 72hours after test completion. If any changes need to be made, you will be notified at that same time.   Medications reviewed and updated.  No changes recommended at this time.   A referral was ordered for Dr Katrinka BlazingSmith to evaluate your legs.   Please followup in 6 months

## 2016-02-05 NOTE — Progress Notes (Signed)
Subjective:    Patient ID: [name], female    DOB: 10/14/1921, 82 y.o.   MRN: [id number]  HPI She is here for follow up.  Hypertension: She is taking her medication daily. She is compliant with a low sodium diet.  She denies chest pain, palpitations, edema, shortness of breath and regular headaches. She is not in exercising regularly.  She does not monitor her blood pressure at home, but at other offices since it has been controlled.    Hyperlipidemia: She is taking her medication daily. She is compliant with a low fat/cholesterol diet. She is not exercising regularly.    History of CVA, PVD, carotid artery disease, subclavian steal syndrome:  She takes plavix daily.  Her last carotid ultrasound was in 2014. She is not currently following with vascular surgery.  She feels her legs are different lengths since her hip fracture.  She denies pain with walking.  She wonders if she should have a shoe lift. She would like to have this evaluated.  She has chronic right knee pain and takes Tylenol as needed. This helps a little, but wonders what else she can do. She wonders if she can try some topical medication.  Medications and allergies reviewed with patient and updated if appropriate.  Patient Active Problem List   Diagnosis Date Noted  . Carotid artery disease (HCC) 08/08/2015  . Compression fracture of thoracic vertebra (HCC) 12/03/2014  . Intractable back pain 11/30/2014  . Hyperglycemia 07/10/2013  . Osteoporosis with fracture   . SEBACEOUS CYST 10/12/2010  . MACULAR DEGENERATION 07/13/2010  . 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 SUBCLAVIAN STEAL SYNDROME 07/13/2010  . PERIPHERAL VASCULAR DISEASE 07/13/2010  . ARTHRITIS 07/13/2010  . Hyperlipidemia 07/12/2010  . Essential hypertension 07/12/2010  . Coronary atherosclerosis 07/12/2010    Current Outpatient Prescriptions on File Prior to Visit  Medication Sig Dispense Refill  . atenolol (TENORMIN) 25 MG tablet  TAKE 1 TABLET BY MOUTH DAILY . TAKE WITH 50 MG TABLET FOR A TOTAL OF 75 MG (Patient taking differently: TAKE 25 MG BY MOUTH DAILY . TAKE WITH 50 MG TABLET FOR A TOTAL OF 75 MG) 90 tablet 3  . atenolol (TENORMIN) 50 MG tablet TAKE 1 TABLET BY MOUTH DAILY . TAKE WITH 25 MG TABLET FOR A TOTAL OF 75 MG. (Patient taking differently: TAKE 50 MG BY MOUTH DAILY . TAKE WITH 25 MG TABLET FOR A TOTAL OF 75 MG.) 90 tablet 3  . Calcium Citrate-Vitamin D (CALCIUM CITRATE PETITE/VIT D) 200-250 MG-UNIT TABS Take 2 tablets by mouth 2 (two) times daily. 120 tablet   . Cholecalciferol (VITAMIN D3) 1000 units CAPS Takes one tablet daily (Patient taking differently: Take 1,000 Units by mouth daily. ) 60 capsule   . clopidogrel (PLAVIX) 75 MG tablet Take 1 tablet (75 mg total) by mouth daily. 90 tablet 1  . pravastatin (PRAVACHOL) 20 MG tablet Take 1 tablet (20 mg total) by mouth daily. 90 tablet 2  . risedronate (ACTONEL) 35 MG tablet Take 1 tablet (35 mg total) by mouth every 7 (seven) days. with water on empty stomach, nothing by mouth or lie down for next 30 minutes. 12 tablet 3   No current facility-administered medications on file prior to visit.    Past Medical History  Diagnosis Date  . ARTHRITIS   . 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 CORONARY ARTERY DISEASE   . MACULAR DEGENERATION   . PERIPHERAL VASCULAR DISEASE   .  Sebaceous cyst     L chin and R neck  . Subclavian steal syndrome   . HYPERTENSION   . HYPERLIPIDEMIA   . Cyst of breast 2007    cyst on breast  . 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 Tonsillectomy    . Sabacious cyst removed from breast  2011  . Breast surgery  2011    sebacious cyst removal   . Hip pinning Right 04/2014  . Esophagogastroduodenoscopy N/A 11/25/2015    Procedure: ESOPHAGOGASTRODUODENOSCOPY (EGD);  Surgeon: Bernette Redbirdobert Buccini, MD;  Location:  [facility];  Service: Endoscopy;  Laterality: N/A;    Social History   Social History  . Marital Status: Widowed    Spouse Name: N/A  . Number of Children: N/A  . Years of Education: N/A   Occupational History  . Retired from [employer]    Social History Main Topics  . Smoking status: Former Smoker -- 1.00 packs/day for 61 years    Quit date: 09/29/2009  . Smokeless tobacco: Never Used  . Alcohol Use: 0.0 oz/week    0 Standard drinks or equivalent per week     Comment: Occasional  . Drug Use: No  . Sexual Activity: No   Other Topics Concern  . Not on file   Social History Narrative   Lives in Independent Living Facility. [place] fall of [place] to be near daughter    Family History  Problem Relation Age of Onset  . Heart disease Other     Parent  . Miscarriages / Stillbirths Other   . Heart attack Father   . Cancer Mother   . Hyperlipidemia Brother   . Hypertension Brother   . Stroke Brother     Review of Systems  Constitutional: Negative for fever.  Respiratory: Negative for cough, shortness of breath and wheezing.   Cardiovascular: Negative for chest pain, palpitations and leg swelling.  Gastrointestinal: Negative for abdominal pain.       No gerd  Neurological: Negative for light-headedness and headaches.       Objective:   Filed Vitals:   02/05/16 1037 02/05/16 1130  BP: 156/110 144/96  Pulse: 64   Temp: 97.5 F (36.4 C)   Resp: 16    Filed Weights   02/05/16 1037  Weight: 119 lb (53.978 kg)   Body mass index is 21.76 kg/(m^2).   Physical Exam Constitutional: Appears well-developed and well-nourished. No distress.  Neck: Neck supple. No tracheal deviation present. No thyromegaly present.  No carotid bruit. No cervical adenopathy.   Cardiovascular: Normal rate, regular rhythm and normal heart sounds.   2/6 systolic murmur heard.  No edema Pulmonary/Chest: Effort normal and breath sounds normal. No respiratory  distress. No wheezes.        Assessment & Plan:   See Problem List for Assessment and Plan of chronic medical problems.  F/u in 6 months

## 2016-02-05 NOTE — Assessment & Plan Note (Signed)
Taking actonel weekly Taking calcium and vitamin d daily Order dexa at next visit

## 2016-02-05 NOTE — Assessment & Plan Note (Signed)
Check a1c 

## 2016-02-06 ENCOUNTER — Encounter: Payer: Self-pay | Admitting: Internal Medicine

## 2016-02-09 ENCOUNTER — Telehealth: Payer: Self-pay | Admitting: Internal Medicine

## 2016-02-09 NOTE — Telephone Encounter (Signed)
Patient called to get 02/05/2016 lab results. Advised of notation

## 2016-02-11 NOTE — Telephone Encounter (Signed)
See my mychart message - please give her the results.  Thanks.

## 2016-02-19 ENCOUNTER — Encounter: Payer: Self-pay | Admitting: Family Medicine

## 2016-02-19 ENCOUNTER — Ambulatory Visit (INDEPENDENT_AMBULATORY_CARE_PROVIDER_SITE_OTHER): Payer: Medicare HMO | Admitting: Family Medicine

## 2016-02-19 VITALS — BP 136/82 | HR 74 | Wt 120.0 lb

## 2016-02-19 DIAGNOSIS — M2170 Unequal limb length (acquired), unspecified site: Secondary | ICD-10-CM | POA: Insufficient documentation

## 2016-02-19 DIAGNOSIS — M8080XS Other osteoporosis with current pathological fracture, unspecified site, sequela: Secondary | ICD-10-CM | POA: Diagnosis not present

## 2016-02-19 MED ORDER — VITAMIN D (ERGOCALCIFEROL) 1.25 MG (50000 UNIT) PO CAPS: 50000.0000 [IU] | ORAL_CAPSULE | ORAL | Status: DC

## 2016-02-19 NOTE — Assessment & Plan Note (Signed)
Severe osteoporosis. Discussed continuing the bisphosphonate would be a good idea but patient does not want to do is going to stop it on her own. We did discuss then we would like her to do at least a higher dose vitamin D supplementation that I think will be beneficial. Patient does take as significant amount calcium in her diet she states. Patient states that she just does not want to continue to take some any medications. Patient come back in 6 weeks and we'll see if she wants to restart bisphosphonate

## 2016-02-19 NOTE — Patient Instructions (Signed)
Good to meet you both  Once weekly vitamin D for the next 12 weeks.  Stop the calcium and can stop the actonel for now, we may need to restart it  pennsaid pinkie amount topically 2 times daily as needed.  Stop this though if you hurt your stomach or easy bruising.  Put a orthotic (dr. Bonner PunaShoals or something) in the right shoe only  Tart cherry extract any dose at night can help with sleep and arthritis pain  See me again in 6 weeks.

## 2016-02-19 NOTE — Assessment & Plan Note (Signed)
Discuss 8 of an inch heel lift first possible orthotic in the right shoe only. Patient will see what she can fine at the store. Do not want her to correct the complete quarter of an inch would do think that that would cause more strain on her back. Patient come back again in 6 weeks

## 2016-02-19 NOTE — Progress Notes (Signed)
[facility] [address] [place], [place] [address] Phone: [phone] Subjective:    I'm seeing this patient by the request  of:  Pincus Sanes, MD   CC: right hip pain, possible leg length discrepancy  [id number] [name] is a 82 y.o. female coming in with complaint of right leg pain. Patient has past pedicle history significant for a fracture after a fall. Patient did have pinning of the hip. Since then she has felt like her right leg has been shorter. Patient states that sometimes when she stands she notices she is loading the amount of force on her right leg. States that seems to be given her some foot pain as well as some knee pain from time to time. Patient is wondering if there is anything she can do for it. Past medical history is also for a insufficiency fracture of the spine. States that overall she seems to be doing relatively well. Does have daily aches and pains but nothing severe. Walks with the aid of a walker. No fevers or chills and no significant weight loss.     Past Medical History  Diagnosis Date  . ARTHRITIS   . 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 CORONARY ARTERY DISEASE   . MACULAR DEGENERATION   . PERIPHERAL VASCULAR DISEASE   . Sebaceous cyst     L chin and R neck  . Subclavian steal syndrome   . HYPERTENSION   . HYPERLIPIDEMIA   . Cyst of breast 2007    cyst on breast  . 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 Tonsillectomy    . Sabacious cyst removed from breast  2011  . Breast surgery  2011    sebacious cyst removal   . Hip pinning Right 04/2014  . Esophagogastroduodenoscopy N/A 11/25/2015    Procedure: ESOPHAGOGASTRODUODENOSCOPY (EGD);  Surgeon: Bernette Redbird, MD;  Location: [facility];  Service: Endoscopy;  Laterality: N/A;   Social History   Social History  . Marital Status:  Widowed    Spouse Name: N/A  . Number of Children: N/A  . Years of Education: N/A   Occupational History  . 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 Smoking status: Former Smoker -- 1.00 packs/day for 61 years    Quit date: 09/29/2009  . Smokeless tobacco: Never Used  . Alcohol Use: 0.0 oz/week    0 Standard drinks or equivalent per week     Comment: Occasional  . Drug Use: No  . Sexual Activity: No   Other Topics Concern  . None   Social History Narrative   [place]ives in [place]. Moved from [place] in the fall of 2011 to be near daughter   Allergies  Allergen Reactions  . Codeine     Unknown  . Penicillins Other (See Comments)    Reaction: unknown  Has patient had a PCN reaction causing immediate rash, facial/tongue/throat swelling, SOB or lightheadedness with hypotension: n/a Has patient had a PCN reaction causing severe rash involving mucus membranes or skin necrosis: n/a Has patient had a PCN reaction that required hospitalization: n/a Has patient had a PCN reaction occurring within the last 10 years: n/a If all of the above answers are "NO", then may proceed with Cephalosporin use.   . Sulfonamide Derivatives  Pt states that it was so long ago that she does not remember what happened.    Family History  Problem Relation Age of Onset  . Heart disease Other     Parent  . Miscarriages / Stillbirths Other   . Heart attack Father   . Cancer Mother   . Hyperlipidemia Brother   . Hypertension Brother   . Stroke Brother     Past medical history, social, surgical and family history all reviewed in electronic medical record.  No pertanent information unless stated regarding to the chief complaint.   Review of Systems: No headache, visual changes, nausea, vomiting, diarrhea, constipation, dizziness, abdominal pain, skin rash, fevers, chills, night sweats, weight loss, swollen lymph nodes, chest pain, shortness of breath, mood  changes.   Objective Blood pressure 136/82, pulse 74, weight 120 lb (54.432 kg).  General: No apparent distress alert and oriented x3 mood and affect normal, dressed appropriately.  HEENT: Pupils equal, extraocular movements intact  Respiratory: Patient's speak in full sentences and does not appear short of breath  Cardiovascular: No lower extremity edema, non tender, no erythema  Skin: dry skin with some mild exfoliation Abdomen: Soft nontender  Neuro: Cranial nerves II through XII are intact, neurovascularly intact in all extremities with 2+ DTRs and 2+ pulses.  Lymph: No lymphadenopathy of posterior or anterior cervical chain or axillae bilaterally.  Gait antalgic gait walking with the aid of a walker MSK:  Non tender with mild limitation ofrange of motion and good stabilityan mild overall muscle wasting elbows, wrist,  knee and ankles bilaterally. Significant arthritic changes of multiple joints Significant kyphosis and scoliosis Right hip does have some tenderness. This has some mild crepitus with range of motion. Pain seems to be more in the groin area as well as the lateral aspect of the hip. Patient's incision is well-healed. Patient does have 4-5 strength but symmetric compared to the contralateral side. Neurovascular intact distally. Patient does have a short leg on the right side by approximately quarter of an inch    Impression and Recommendations:     This case required medical decision making of moderate complexity.      Note: This dictation was prepared with Dragon dictation along with smaller phrase technology. Any transcriptional errors that result from this process are unintentional.

## 2016-03-28 ENCOUNTER — Other Ambulatory Visit: Payer: Self-pay | Admitting: Internal Medicine

## 2016-04-13 ENCOUNTER — Ambulatory Visit: Payer: Medicare HMO | Admitting: Family Medicine

## 2016-04-25 ENCOUNTER — Telehealth: Payer: Self-pay

## 2016-04-25 NOTE — Telephone Encounter (Signed)
Spoke with Steffanie, she will be re-faxing orders.

## 2016-04-25 NOTE — Telephone Encounter (Signed)
[name] had called to check on orders that was faxed (9/20) over for pt and ot for this patient. Can you please follow up with her.   Her number is: [phone]  If you have revised the fax and not need to speak with her Fax: [phone]  Thank you.

## 2016-04-26 NOTE — Telephone Encounter (Signed)
Orders faxed back. LVM with Judeth CornfieldStephanie informing.

## 2016-05-09 ENCOUNTER — Other Ambulatory Visit: Payer: Self-pay | Admitting: Family Medicine

## 2016-05-09 NOTE — Telephone Encounter (Signed)
Refill done.  

## 2016-05-10 ENCOUNTER — Encounter: Payer: Self-pay | Admitting: Family Medicine

## 2016-05-10 ENCOUNTER — Encounter: Payer: Self-pay | Admitting: Internal Medicine

## 2016-05-11 NOTE — Telephone Encounter (Signed)
Please advise 

## 2016-06-16 ENCOUNTER — Emergency Department (HOSPITAL_COMMUNITY): Payer: MEDICARE

## 2016-06-16 ENCOUNTER — Encounter (HOSPITAL_COMMUNITY): Payer: Self-pay | Admitting: *Deleted

## 2016-06-16 ENCOUNTER — Inpatient Hospital Stay (HOSPITAL_COMMUNITY)
Admission: EM | Admit: 2016-06-16 | Discharge: 2016-06-18 | DRG: 392 | Disposition: A | Discharge status: Home / Self Care | Payer: MEDICARE | Attending: Internal Medicine | Admitting: Internal Medicine

## 2016-06-16 DIAGNOSIS — Z885 Allergy status to narcotic agent status: Secondary | ICD-10-CM

## 2016-06-16 DIAGNOSIS — Z823 Family history of stroke: Secondary | ICD-10-CM

## 2016-06-16 DIAGNOSIS — Q399 Congenital malformation of esophagus, unspecified: Secondary | ICD-10-CM

## 2016-06-16 DIAGNOSIS — I739 Peripheral vascular disease, unspecified: Secondary | ICD-10-CM | POA: Diagnosis not present

## 2016-06-16 DIAGNOSIS — Y929 Unspecified place or not applicable: Secondary | ICD-10-CM | POA: Diagnosis not present

## 2016-06-16 DIAGNOSIS — H3530 Unspecified macular degeneration: Secondary | ICD-10-CM | POA: Diagnosis present

## 2016-06-16 DIAGNOSIS — Z8249 Family history of ischemic heart disease and other diseases of the circulatory system: Secondary | ICD-10-CM

## 2016-06-16 DIAGNOSIS — Z882 Allergy status to sulfonamides status: Secondary | ICD-10-CM | POA: Diagnosis not present

## 2016-06-16 DIAGNOSIS — K2980 Duodenitis without bleeding: Secondary | ICD-10-CM | POA: Diagnosis not present

## 2016-06-16 DIAGNOSIS — I2510 Atherosclerotic heart disease of native coronary artery without angina pectoris: Secondary | ICD-10-CM | POA: Diagnosis not present

## 2016-06-16 DIAGNOSIS — Z880 Allergy status to penicillin: Secondary | ICD-10-CM | POA: Diagnosis not present

## 2016-06-16 DIAGNOSIS — M1990 Unspecified osteoarthritis, unspecified site: Secondary | ICD-10-CM | POA: Diagnosis not present

## 2016-06-16 DIAGNOSIS — R079 Chest pain, unspecified: Secondary | ICD-10-CM

## 2016-06-16 DIAGNOSIS — Z7902 Long term (current) use of antithrombotics/antiplatelets: Secondary | ICD-10-CM

## 2016-06-16 DIAGNOSIS — K224 Dyskinesia of esophagus: Secondary | ICD-10-CM

## 2016-06-16 DIAGNOSIS — Z79899 Other long term (current) drug therapy: Secondary | ICD-10-CM

## 2016-06-16 DIAGNOSIS — Z66 Do not resuscitate: Secondary | ICD-10-CM | POA: Diagnosis not present

## 2016-06-16 DIAGNOSIS — I10 Essential (primary) hypertension: Secondary | ICD-10-CM | POA: Diagnosis present

## 2016-06-16 DIAGNOSIS — X58XXXA Exposure to other specified factors, initial encounter: Secondary | ICD-10-CM | POA: Diagnosis present

## 2016-06-16 DIAGNOSIS — T18128A Food in esophagus causing other injury, initial encounter: Secondary | ICD-10-CM | POA: Diagnosis present

## 2016-06-16 DIAGNOSIS — K209 Esophagitis, unspecified: Secondary | ICD-10-CM | POA: Diagnosis not present

## 2016-06-16 DIAGNOSIS — W44F3XA Food entering into or through a natural orifice, initial encounter: Secondary | ICD-10-CM

## 2016-06-16 DIAGNOSIS — K222 Esophageal obstruction: Secondary | ICD-10-CM

## 2016-06-16 DIAGNOSIS — E785 Hyperlipidemia, unspecified: Secondary | ICD-10-CM | POA: Diagnosis present

## 2016-06-16 DIAGNOSIS — R0789 Other chest pain: Secondary | ICD-10-CM

## 2016-06-16 DIAGNOSIS — Z87891 Personal history of nicotine dependence: Secondary | ICD-10-CM

## 2016-06-16 LAB — CBC
HEMATOCRIT: 45.5 % (ref 36.0–46.0)
HEMOGLOBIN: 14.7 g/dL (ref 12.0–15.0)
MCH: 28.8 pg (ref 26.0–34.0)
MCHC: 32.3 g/dL (ref 30.0–36.0)
MCV: 89.2 fL (ref 78.0–100.0)
Platelets: 316 10*3/uL (ref 150–400)
RBC: 5.1 MIL/uL (ref 3.87–5.11)
RDW: 15.8 % — AB (ref 11.5–15.5)
WBC: 8.6 10*3/uL (ref 4.0–10.5)

## 2016-06-16 LAB — URINALYSIS, ROUTINE W REFLEX MICROSCOPIC
GLUCOSE, UA: NEGATIVE mg/dL
Ketones, ur: NEGATIVE mg/dL
Nitrite: NEGATIVE
PH: 5.5 (ref 5.0–8.0)
Protein, ur: NEGATIVE mg/dL
Specific Gravity, Urine: 1.024 (ref 1.005–1.030)

## 2016-06-16 LAB — BRAIN NATRIURETIC PEPTIDE: B NATRIURETIC PEPTIDE 5: 103.8 pg/mL — AB (ref 0.0–100.0)

## 2016-06-16 LAB — I-STAT TROPONIN, ED
TROPONIN I, POC: 0.01 ng/mL (ref 0.00–0.08)
Troponin i, poc: 0 ng/mL (ref 0.00–0.08)

## 2016-06-16 LAB — BASIC METABOLIC PANEL
ANION GAP: 10 (ref 5–15)
BUN: 28 mg/dL — AB (ref 6–20)
CALCIUM: 9.7 mg/dL (ref 8.9–10.3)
CO2: 25 mmol/L (ref 22–32)
Chloride: 105 mmol/L (ref 101–111)
Creatinine, Ser: 0.89 mg/dL (ref 0.44–1.00)
GFR calc Af Amer: >60 mL/min (ref >60)
GFR calc non Af Amer: 55 mL/min — ABNORMAL LOW (ref >60)
GLUCOSE: 109 mg/dL — AB (ref 65–99)
Potassium: 3.8 mmol/L (ref 3.5–5.1)
Sodium: 140 mmol/L (ref 135–145)

## 2016-06-16 LAB — URINE MICROSCOPIC-ADD ON

## 2016-06-16 MED ORDER — IOPAMIDOL (ISOVUE-300) INJECTION 61%
75.0000 mL | Freq: Once | INTRAVENOUS | Status: AC | PRN
  Administered 2016-06-17: 75 mL via INTRAVENOUS

## 2016-06-16 MED ORDER — GLUCAGON HCL RDNA (DIAGNOSTIC) 1 MG IJ SOLR
1.0000 mg | Freq: Once | INTRAMUSCULAR | Status: AC
  Administered 2016-06-16: 1 mg via INTRAVENOUS
  Filled 2016-06-16: qty 1

## 2016-06-16 MED ORDER — GI COCKTAIL ~~LOC~~
30.0000 mL | Freq: Once | ORAL | Status: AC
  Administered 2016-06-16: 30 mL via ORAL
  Filled 2016-06-16: qty 30

## 2016-06-16 MED ORDER — IOPAMIDOL (ISOVUE-370) INJECTION 76%
INTRAVENOUS | Status: AC
  Filled 2016-06-16: qty 100

## 2016-06-16 MED ORDER — CIPROFLOXACIN HCL 500 MG PO TABS
250.0000 mg | ORAL_TABLET | Freq: Once | ORAL | Status: AC
  Administered 2016-06-16: 250 mg via ORAL
  Filled 2016-06-16: qty 1

## 2016-06-16 MED ORDER — SODIUM CHLORIDE 0.9 % IV BOLUS (SEPSIS)
500.0000 mL | Freq: Once | INTRAVENOUS | Status: AC
  Administered 2016-06-16: 500 mL via INTRAVENOUS

## 2016-06-16 MED ORDER — IOPAMIDOL (ISOVUE-370) INJECTION 76%: 100.0000 mL | Freq: Once | INTRAVENOUS | Status: DC | PRN

## 2016-06-16 MED ORDER — SODIUM CHLORIDE 0.9 % IV BOLUS (SEPSIS)
250.0000 mL | Freq: Once | INTRAVENOUS | Status: AC
  Administered 2016-06-17: 250 mL via INTRAVENOUS

## 2016-06-16 MED ORDER — SODIUM CHLORIDE 0.9 % IJ SOLN
INTRAMUSCULAR | Status: AC
  Filled 2016-06-16: qty 50

## 2016-06-16 NOTE — ED Notes (Signed)
Patient refusing to wear cardiac monitor. Explained to patient risks and benefits of wearing monitor. EKG performed in triage.

## 2016-06-16 NOTE — ED Triage Notes (Signed)
Pt presents with central chest pain and back pain that began last night.  Pt having some SOB and nausea.  Pt is currently reporting more back pain than chest pain at this time.  No diaphoresis noted.  Pt a/o x 4 and ambulates with a walker. Rates pain a 5 out of 10.

## 2016-06-16 NOTE — ED Notes (Signed)
Bed: WA14 Expected date:  Expected time:  Means of arrival:  Comments: Hold for triage 4 

## 2016-06-16 NOTE — ED Provider Notes (Addendum)
  Physical Exam  BP 149/71 (BP Location: Left Arm)   Pulse 79   Temp 97.6 F (36.4 C) (Oral)   Resp 18   Ht 5\' 2"  (1.575 m)   Wt 118 lb (53.5 kg)   SpO2 92%   BMI 21.58 kg/m   Physical Exam  ED Course  Procedures  MDM 82 y/o with cc of chest pain + back pain. Also reports dysphagia. - CT ordered, as she has hx of compression fractures and Dr. Ranae PalmsYelverton wants to ensure there is no fragment pushing on the esophagus.  CT ordered. Post CT - consult GI and pt will likely need admission. Pt has seen [facility] in the past.   Derwood KaplanAnkit Codee Bloodworth, MD 06/16/16 2354   3:30 AM Dr. Georgiann CockerBrahmbatt from GI to see.   Derwood KaplanAnkit Bobette Leyh, MD 06/17/16 [phone]

## 2016-06-16 NOTE — ED Notes (Signed)
Pt repositioned in bed and given water to try to drink along with glucagon IV

## 2016-06-16 NOTE — ED Provider Notes (Signed)
[facility]-EMERGENCY DEPT Provider Note   CSN: [id number] Arrival date & time: 06/16/16  1500     History   Chief Complaint Chief Complaint  Patient presents with  . Chest Pain    HPI [name] is a 82 y.o. female.  HPI Patient presents with central chest pain radiating to her thoracic back after eating yesterday. States the pain has now resolved. Was having some pain with swallowing. She's had decreased oral intake today but no nausea or vomiting. She states that her chest pain and thoracic back pain have resolved completely. He said no fever or chills. No shortness of breath or cough. Denies any abdominal pain. Past Medical History:  Diagnosis Date  . ARTHRITIS   . CEREBRAL EMBOLISM, WITH INFARCTION 07/2010   mild R HP  . CORONARY ARTERY DISEASE   . Cyst of breast 2007   cyst on breast  . HYPERLIPIDEMIA   . HYPERTENSION   . MACULAR DEGENERATION   . Osteoporosis, post-menopausal 07/2012   T10 compression fx, R hip fx 04/02/14 after fall  . PERIPHERAL VASCULAR DISEASE   . Sebaceous cyst    L chin and R neck  . Subclavian steal syndrome     Patient Active Problem List   Diagnosis Date Noted  . Discharge planning issues   . STEMI (ST elevation myocardial infarction) (HCC) 06/21/2016  . STEMI involving left anterior descending coronary artery (HCC)   . Cardiogenic shock (HCC)   . Holosystolic murmur   . Esophageal dysmotility 06/17/2016  . Atypical chest pain   . Esophageal obstruction due to food impaction   . Leg length discrepancy 02/19/2016  . Primary osteoarthritis of right knee 02/05/2016  . Carotid artery disease (HCC) 08/08/2015  . Compression fracture of thoracic vertebra (HCC) 12/03/2014  . Intractable back pain 11/30/2014  . Hyperglycemia 07/10/2013  . Osteoporosis with fracture   . SEBACEOUS CYST 10/12/2010  . MACULAR DEGENERATION 07/13/2010  . 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 SUBCLAVIAN STEAL SYNDROME 07/13/2010  . PERIPHERAL VASCULAR  DISEASE 07/13/2010  . Hyperlipidemia 07/12/2010  . Essential hypertension 07/12/2010  . Coronary atherosclerosis 07/12/2010    Past Surgical History:  Procedure Laterality Date  . BREAST SURGERY  2011   sebacious cyst removal   . ESOPHAGOGASTRODUODENOSCOPY N/A 11/25/2015   Procedure: ESOPHAGOGASTRODUODENOSCOPY (EGD);  Surgeon: Bernette Redbirdobert Buccini, MD;  Location[facility];  Service: Endoscopy;  Laterality: N/A;  . ESOPHAGOGASTRODUODENOSCOPY (EGD) WITH PROPOFOL N/A 06/17/2016   Procedure: ESOPHAGOGASTRODUODENOSCOPY (EGD) WITH PROPOFOL;  Surgeon: Beverley FiedlerJay M Pyrtle, MD;  Location: [facility];  Service: Endoscopy;  Laterality: N/A;  . HIP PINNING Right 04/2014  . Sabacious cyst removed from breast  2011  . TONSILLECTOMY      OB History    No data available       Home Medications    Prior to Admission medications   Medication Sig Start Date End Date Taking? Authorizing Provider  atenolol (TENORMIN) 25 MG tablet TAKE 1 TABLET BY MOUTH DAILY . TAKE WITH 50 MG TABLET FOR A TOTAL OF 75 MG Patient taking differently: Take 25 mg by mouth in the morning (in conjunction with one 50 mg tablet to equal a total of 75 milligrams) 07/15/15  Yes Newt LukesValerie A Leschber, MD  atenolol (TENORMIN) 50 MG tablet TAKE 1 TABLET BY MOUTH DAILY . TAKE WITH 25 MG TABLET FOR A TOTAL OF 75 MG. Patient taking differently: Take 50 mg by mouth in the morning (in conjunction with one 25 mg tablet to equal a  total of 75 milligrams) 07/15/15  Yes Newt LukesValerie A Leschber, MD  clopidogrel (PLAVIX) 75 MG tablet Take 1 tablet (75 mg total) by mouth daily. 01/26/16  Yes Pincus SanesStacy J Burns, MD  pravastatin (PRAVACHOL) 20 MG tablet TAKE 1 TABLET (20 MG TOTAL) BY MOUTH DAILY. Patient taking differently: Take 20 mg by mouth every evening.  03/29/16  Yes Pincus SanesStacy J Burns, MD  acetaminophen (TYLENOL) 500 MG tablet Take 500-1,000 mg by mouth every 6 (six) hours as needed (for pain).    Historical Provider, MD  pantoprazole (PROTONIX) 40 MG tablet Take 1  tablet (40 mg total) by mouth daily at 6 (six) AM. 06/19/16   Kathlen ModyVijaya Akula, MD    Family History Family History  Problem Relation Age of Onset  . Heart attack Father   . Cancer Mother   . Heart disease Other     Parent  . Miscarriages / Stillbirths Other   . Hyperlipidemia Brother   . Hypertension Brother   . Stroke Brother     Social History Social History  Substance Use Topics  . Smoking status: Former Smoker    Packs/day: 1.00    Years: 61.00    Quit date: 09/29/2009  . Smokeless tobacco: Never Used  . Alcohol use 0.0 oz/week     Comment: Occasional     Allergies   Codeine; Penicillins; and Sulfonamide derivatives   Review of Systems Review of Systems  Constitutional: Negative for chills and fever.  HENT: Positive for trouble swallowing. Negative for sore throat and voice change.   Respiratory: Negative for cough and shortness of breath.   Cardiovascular: Positive for chest pain. Negative for palpitations and leg swelling.  Gastrointestinal: Negative for abdominal pain, constipation, diarrhea, nausea and vomiting.  Genitourinary: Positive for difficulty urinating. Negative for dysuria, flank pain, frequency and hematuria.  Musculoskeletal: Positive for back pain. Negative for myalgias, neck pain and neck stiffness.  Skin: Negative for rash and wound.  Neurological: Negative for dizziness, weakness, light-headedness, numbness and headaches.  All other systems reviewed and are negative.    Physical Exam Updated Vital Signs BP 110/73 (BP Location: Left Arm)   Pulse 82   Temp 98 F (36.7 C) (Oral)   Resp 18   Ht 5\' 2"  (1.575 m)   Wt 118 lb (53.5 kg)   SpO2 97%   BMI 21.58 kg/m   Physical Exam  Constitutional: She is oriented to person, place, and time. She appears well-developed and well-nourished. No distress.  HENT:  Head: Normocephalic and atraumatic.  Mouth/Throat: Oropharynx is clear and moist. No oropharyngeal exudate.  Eyes: EOM are normal. Pupils  are equal, round, and reactive to light.  Neck: Normal range of motion. Neck supple.  Cardiovascular: Normal rate and regular rhythm.  Exam reveals no gallop and no friction rub.   No murmur heard. Pulmonary/Chest: Effort normal and breath sounds normal.  Few crackles bilateral bases  Abdominal: Soft. Bowel sounds are normal. There is no tenderness. There is no rebound and no guarding.  Musculoskeletal: Normal range of motion. She exhibits no edema or tenderness.  Distal pulses intact. No thoracic or lumbar midline tenderness. No CVA tenderness bilaterally. No lower extremity swelling, asymmetry or tenderness.  Lymphadenopathy:    She has no cervical adenopathy.  Neurological: She is alert and oriented to person, place, and time.  Moves all extremities without deficit. Sensation is fully intact.  Skin: Skin is warm and dry. Capillary refill takes less than 2 seconds. No rash noted. No erythema.  Psychiatric: She has a normal mood and affect. Her behavior is normal.  Nursing note and vitals reviewed.    ED Treatments / Results  Labs (all labs ordered are listed, but only abnormal results are displayed) Labs Reviewed  URINE CULTURE - Abnormal; Notable for the following:       Result Value   Culture 40,000 COLONIES/mL PROTEUS MIRABILIS (*)    Organism ID, Bacteria PROTEUS MIRABILIS (*)    All other components within normal limits  BASIC METABOLIC PANEL - Abnormal; Notable for the following:    Glucose, Bld 109 (*)    BUN 28 (*)    GFR calc non Af Amer 55 (*)    All other components within normal limits  CBC - Abnormal; Notable for the following:    RDW 15.8 (*)    All other components within normal limits  URINALYSIS, ROUTINE W REFLEX MICROSCOPIC (NOT AT [facility]) - Abnormal; Notable for the following:    Color, Urine AMBER (*)    APPearance CLOUDY (*)    Hgb urine dipstick TRACE (*)    Bilirubin Urine SMALL (*)    Leukocytes, UA MODERATE (*)    All other components within normal  limits  BRAIN NATRIURETIC PEPTIDE - Abnormal; Notable for the following:    B Natriuretic Peptide 103.8 (*)    All other components within normal limits  URINE MICROSCOPIC-ADD ON - Abnormal; Notable for the following:    Squamous Epithelial / LPF 6-30 (*)    Bacteria, UA MANY (*)    All other components within normal limits  I-STAT TROPOININ, ED  I-STAT TROPOININ, ED    EKG  EKG Interpretation  Date/Time:  Thursday June 16 2016 15:09:50 EST Ventricular Rate:  78 PR Interval:    QRS Duration: 78 QT Interval:  400 QTC Calculation: 456 R Axis:   -29 Text Interpretation:  Sinus arrhythmia Ventricular premature complex Borderline left axis deviation Probable anteroseptal infarct, old Minimal ST depression, lateral leads Baseline wander in lead(s) V3 V6 Confirmed by Ranae Palms  MD, Liesl Simons ([id number]) on 06/16/2016 7:40:12 PM       Radiology Ct Angio Chest/abd/pel For Dissection W And/or Wo Contrast  Result Date: 06/30/2016 CLINICAL DATA:  Chest and back pain EXAM: CT ANGIOGRAPHY CHEST, ABDOMEN AND PELVIS TECHNIQUE: Multidetector CT imaging through the chest, abdomen and pelvis was performed using the standard protocol during bolus administration of intravenous contrast. Multiplanar reconstructed images and MIPs were obtained and reviewed to evaluate the vascular anatomy. CONTRAST:  100 mL Isovue 370 intravenous COMPARISON:  06/16/2016, 11/30/2014 FINDINGS: CTA CHEST FINDINGS Cardiovascular: There is ectasia of the ascending aorta, measuring up to 4 cm in maximum diameter. There is ectasia diffusely of the aortic arch with diffuse irregular calcification and mural thrombus. Arch measures 3.6 cm in maximum diameter. There is no dissection seen. There is no evidence for mediastinal hematoma. Bovine arch variant. There is occlusion of the origin and proximal left subclavian artery with reconstitution distally. Prominent central pulmonary arteries. No gross filling defects. Moderate mural thrombus  is present within the descending thoracic aorta which is noted to be tortuous. There are coronary artery calcifications. Heart size is borderline enlarged. No significant pericardial effusion. Mediastinum/Nodes: Imaged thyroid gland grossly unremarkable. Few nonspecific sub cm mediastinal lymph nodes. No hilar adenopathy. No axillary adenopathy. Minimal air distention of the esophagus. Lungs/Pleura: Moderate emphysematous disease bilaterally. No acute consolidation or effusion. Mild atelectasis or scar in the right lower lobe. Musculoskeletal: Chronic compression deformities T6, T7 and T10 as  before. Review of the MIP images confirms the above findings. CTA ABDOMEN AND PELVIS FINDINGS VASCULAR Aorta: Diffuse atherosclerotic vascular calcification with extensive mural thrombus present. No dissection is seen. Celiac: Atherosclerotic calcification at the origin of the celiac axis but no high-grade stenosis. Mild narrowing at the origin is suggested. SMA: Atherosclerotic calcifications are present within the SMA. Moderate severe focal stenosis of the proximal SMA , just distal to its origin. Renals: Atherosclerotic calcification at the origin of the right renal artery with scattered calcifications throughout the course of the vessel. No high-grade stenosis. Mild stenosis of the proximal left renal artery with scattered atherosclerotic calcification IMA: Visualized and grossly patent.  Calcified plaque at the origin. Inflow: Diffuse calcification and mural thrombus within the distal abdominal aorta. Moderate diffuse disease of the common iliac artery's with at least moderate focal stenosis of the proximal right common iliac artery. Moderate severe disease of right internal iliac artery. Moderate diffuse disease of the right external iliac artery with moderate severe irregular stenosis of the right common femoral artery. Moderate diffuse disease of the left common iliac artery. Moderate severe diffuse disease of left  internal iliac artery. Mild to moderate diffuse disease of the left external iliac artery. Left common femoral artery shows mild focal narrowing but is grossly patent. Veins: Suboptimal evaluation due to arterial phase exam. Review of the MIP images confirms the above findings. NON-VASCULAR Hepatobiliary: No focal liver abnormality is seen. No gallstones, gallbladder wall thickening, or biliary dilatation. Pancreas: Unremarkable. No pancreatic ductal dilatation or surrounding inflammatory changes. Spleen: Irregular hypodensity in the spleen likely related to phase of enhancement. Adrenals/Urinary Tract: 2.2 cm right adrenal gland adenoma unchanged. Left adrenal gland normal. Kidneys are slightly atrophic. No hydronephrosis. The bladder is unremarkable. Stomach/Bowel: There is no dilated large or small bowel. The stomach is unremarkable. The appendix is visualized and is normal. Sigmoid colon diverticular disease without bowel wall thickening. Lymphatic: No significantly enlarged abdominal or pelvic lymph nodes. Reproductive: Status post hysterectomy. No adnexal masses. Other: No free air or free fluid. Musculoskeletal: Severe multilevel degenerative changes of the lumbar spine. Grade 1 anterior listhesis of L3 on L4. Multilevel vacuum disc phenomenon. Review of the MIP images confirms the above findings. IMPRESSION: 1. No evidence for acute aortic dissection or mediastinal hematoma. 2. Extensive atherosclerotic vascular calcification and mural thrombus. Bovine arch variant with occluded proximal left subclavian artery which reconstitutes distally. Moderate severe focal stenosis of the SMA just distal to its origin. 3. 2.2 cm right adrenal gland adenoma unchanged. Electronically Signed   By: Jasmine Pang M.D.   On: 2016-07-07 23:37    Procedures Procedures (including critical care time)  Medications Ordered in ED Medications  sodium chloride 0.9 % injection (not administered)  iopamidol (ISOVUE-370) 76 %  injection (not administered)  sodium chloride 0.9 % bolus 500 mL (0 mLs Intravenous Stopped 06/16/16 2128)  gi cocktail (Maalox,Lidocaine,Donnatal) (30 mLs Oral Given 06/16/16 1935)  glucagon (human recombinant) (GLUCAGEN) injection 1 mg (1 mg Intravenous Given 06/16/16 2055)  ciprofloxacin (CIPRO) tablet 250 mg (250 mg Oral Given 06/16/16 2313)  sodium chloride 0.9 % bolus 250 mL (0 mLs Intravenous Stopped 06/17/16 0036)  iopamidol (ISOVUE-300) 61 % injection 75 mL (75 mLs Intravenous Contrast Given 06/17/16 0006)     Initial Impression / Assessment and Plan / ED Course  I have reviewed the triage vital signs and the nursing notes.  Pertinent labs & imaging results that were available during my care of the patient were reviewed by me  and considered in my medical decision making (see chart for details).  Clinical Course   Patient's chest pain is resolved. No definite changes to her EKG. Troponin 2 is normal. Patient still complains of thoracic back pain. Patient attempted to take fluid and small amount of applesauce with antibiotic and then vomited it back. Patient had esophageal obstruction with food earlier in the year. Had endoscopy performed at that time. The food bolus was pushed into the stomach. There is no abnormality found with the esophagus to account for the obstruction. Felt possibly due to dysphagia. Patient also has multiple thoracic compression fractures in her back. Concern for possible external compression of the esophagus and is resulting in obstruction. We'll also rule out any aortic disease with CT angio of the chest. Signed out to oncoming emergency physician.  Final Clinical Impressions(s) / ED Diagnoses   Final diagnoses:  Chest pain  Esophageal dysmotility    New Prescriptions Discharge Medication List as of 06/18/2016  4:30 PM    START taking these medications   Details  pantoprazole (PROTONIX) 40 MG tablet Take 1 tablet (40 mg total) by mouth daily at 6 (six)  AM., Starting Sun 06/19/2016, Normal         Loren Racer, MD 2016-07-13 1722

## 2016-06-16 NOTE — ED Notes (Signed)
Pt  Found sitting on side of bed . Pt appears without distress, no chest pain is noted.

## 2016-06-17 ENCOUNTER — Encounter (HOSPITAL_COMMUNITY)
Admission: EM | Disposition: A | Discharge status: Home / Self Care | Payer: Self-pay | Source: Home / Self Care | Attending: Internal Medicine

## 2016-06-17 ENCOUNTER — Encounter (HOSPITAL_COMMUNITY): Payer: Self-pay

## 2016-06-17 ENCOUNTER — Inpatient Hospital Stay (HOSPITAL_COMMUNITY): Payer: MEDICARE | Admitting: Anesthesiology

## 2016-06-17 DIAGNOSIS — W44F3XA Food entering into or through a natural orifice, initial encounter: Secondary | ICD-10-CM

## 2016-06-17 DIAGNOSIS — T18128A Food in esophagus causing other injury, initial encounter: Secondary | ICD-10-CM

## 2016-06-17 DIAGNOSIS — K224 Dyskinesia of esophagus: Secondary | ICD-10-CM | POA: Diagnosis not present

## 2016-06-17 DIAGNOSIS — I10 Essential (primary) hypertension: Secondary | ICD-10-CM | POA: Diagnosis not present

## 2016-06-17 DIAGNOSIS — M1990 Unspecified osteoarthritis, unspecified site: Secondary | ICD-10-CM | POA: Diagnosis present

## 2016-06-17 DIAGNOSIS — R079 Chest pain, unspecified: Secondary | ICD-10-CM | POA: Diagnosis present

## 2016-06-17 DIAGNOSIS — Q399 Congenital malformation of esophagus, unspecified: Secondary | ICD-10-CM | POA: Diagnosis not present

## 2016-06-17 DIAGNOSIS — X58XXXA Exposure to other specified factors, initial encounter: Secondary | ICD-10-CM | POA: Diagnosis present

## 2016-06-17 DIAGNOSIS — I739 Peripheral vascular disease, unspecified: Secondary | ICD-10-CM | POA: Diagnosis present

## 2016-06-17 DIAGNOSIS — K222 Esophageal obstruction: Secondary | ICD-10-CM

## 2016-06-17 DIAGNOSIS — Z823 Family history of stroke: Secondary | ICD-10-CM | POA: Diagnosis not present

## 2016-06-17 DIAGNOSIS — Z66 Do not resuscitate: Secondary | ICD-10-CM | POA: Diagnosis present

## 2016-06-17 DIAGNOSIS — Z882 Allergy status to sulfonamides status: Secondary | ICD-10-CM | POA: Diagnosis not present

## 2016-06-17 DIAGNOSIS — K2980 Duodenitis without bleeding: Secondary | ICD-10-CM | POA: Diagnosis present

## 2016-06-17 DIAGNOSIS — Y929 Unspecified place or not applicable: Secondary | ICD-10-CM | POA: Diagnosis not present

## 2016-06-17 DIAGNOSIS — Z880 Allergy status to penicillin: Secondary | ICD-10-CM | POA: Diagnosis not present

## 2016-06-17 DIAGNOSIS — R0789 Other chest pain: Secondary | ICD-10-CM

## 2016-06-17 DIAGNOSIS — E785 Hyperlipidemia, unspecified: Secondary | ICD-10-CM | POA: Diagnosis present

## 2016-06-17 DIAGNOSIS — K209 Esophagitis, unspecified: Secondary | ICD-10-CM | POA: Diagnosis present

## 2016-06-17 DIAGNOSIS — Z8249 Family history of ischemic heart disease and other diseases of the circulatory system: Secondary | ICD-10-CM | POA: Diagnosis not present

## 2016-06-17 DIAGNOSIS — I2510 Atherosclerotic heart disease of native coronary artery without angina pectoris: Secondary | ICD-10-CM | POA: Diagnosis present

## 2016-06-17 DIAGNOSIS — H3530 Unspecified macular degeneration: Secondary | ICD-10-CM | POA: Diagnosis present

## 2016-06-17 DIAGNOSIS — Z7902 Long term (current) use of antithrombotics/antiplatelets: Secondary | ICD-10-CM | POA: Diagnosis not present

## 2016-06-17 DIAGNOSIS — Z885 Allergy status to narcotic agent status: Secondary | ICD-10-CM | POA: Diagnosis not present

## 2016-06-17 HISTORY — PX: ESOPHAGOGASTRODUODENOSCOPY (EGD) WITH PROPOFOL: SHX5813

## 2016-06-17 SURGERY — ESOPHAGOGASTRODUODENOSCOPY (EGD) WITH PROPOFOL
Anesthesia: General

## 2016-06-17 MED ORDER — PROPOFOL 10 MG/ML IV BOLUS
INTRAVENOUS | Status: DC | PRN
  Administered 2016-06-17: 90 mg via INTRAVENOUS
  Administered 2016-06-17: 20 mg via INTRAVENOUS

## 2016-06-17 MED ORDER — LIDOCAINE HCL (CARDIAC) 20 MG/ML IV SOLN
INTRAVENOUS | Status: DC | PRN
  Administered 2016-06-17: 40 mg via INTRAVENOUS

## 2016-06-17 MED ORDER — PROPOFOL 10 MG/ML IV BOLUS
INTRAVENOUS | Status: AC
  Filled 2016-06-17: qty 20

## 2016-06-17 MED ORDER — FENTANYL CITRATE (PF) 100 MCG/2ML IJ SOLN: 25.0000 ug | INTRAMUSCULAR | Status: DC | PRN

## 2016-06-17 MED ORDER — ONDANSETRON HCL 4 MG/2ML IJ SOLN
INTRAMUSCULAR | Status: DC | PRN
  Administered 2016-06-17: 4 mg via INTRAVENOUS

## 2016-06-17 MED ORDER — SODIUM CHLORIDE 0.9 % IV SOLN
INTRAVENOUS | Status: DC
  Administered 2016-06-17: 04:00:00 via INTRAVENOUS

## 2016-06-17 MED ORDER — CLOPIDOGREL BISULFATE 75 MG PO TABS: 75.0000 mg | ORAL_TABLET | Freq: Every day | ORAL | Status: DC

## 2016-06-17 MED ORDER — PHENOL 1.4 % MT LIQD
1.0000 | OROMUCOSAL | Status: DC | PRN
  Filled 2016-06-17: qty 177

## 2016-06-17 MED ORDER — CIPROFLOXACIN IN D5W 400 MG/200ML IV SOLN: 400.0000 mg | Freq: Two times a day (BID) | INTRAVENOUS | Status: DC

## 2016-06-17 MED ORDER — SUCCINYLCHOLINE CHLORIDE 20 MG/ML IJ SOLN
INTRAMUSCULAR | Status: DC | PRN
  Administered 2016-06-17: 80 mg via INTRAVENOUS

## 2016-06-17 MED ORDER — LACTATED RINGERS IV SOLN
INTRAVENOUS | Status: DC
  Administered 2016-06-17: 15:00:00 via INTRAVENOUS

## 2016-06-17 MED ORDER — SUCCINYLCHOLINE CHLORIDE 20 MG/ML IJ SOLN
INTRAMUSCULAR | Status: AC
  Filled 2016-06-17: qty 1

## 2016-06-17 MED ORDER — PRAVASTATIN SODIUM 20 MG PO TABS: 20.0000 mg | ORAL_TABLET | Freq: Every day | ORAL | Status: DC

## 2016-06-17 MED ORDER — CALCIUM CITRATE PETITE/VIT D 200-250 MG-UNIT PO TABS: 2.0000 | ORAL_TABLET | Freq: Two times a day (BID) | ORAL | Status: DC

## 2016-06-17 MED ORDER — ATENOLOL 50 MG PO TABS: 75.0000 mg | ORAL_TABLET | Freq: Every day | ORAL | Status: DC

## 2016-06-17 MED ORDER — ONDANSETRON HCL 4 MG/2ML IJ SOLN
INTRAMUSCULAR | Status: AC
  Filled 2016-06-17: qty 2

## 2016-06-17 MED ORDER — RISEDRONATE SODIUM 5 MG PO TABS: 35.0000 mg | ORAL_TABLET | ORAL | Status: DC

## 2016-06-17 MED ORDER — LIDOCAINE 2% (20 MG/ML) 5 ML SYRINGE
INTRAMUSCULAR | Status: AC
  Filled 2016-06-17: qty 5

## 2016-06-17 MED ORDER — PHENYLEPHRINE 40 MCG/ML (10ML) SYRINGE FOR IV PUSH (FOR BLOOD PRESSURE SUPPORT)
PREFILLED_SYRINGE | INTRAVENOUS | Status: AC
  Filled 2016-06-17: qty 10

## 2016-06-17 SURGICAL SUPPLY — 14 items

## 2016-06-17 NOTE — Consult Note (Signed)
Consultation  Referring Provider:  Dr Blake DivineAkula Primary Care Physician:  Pincus SanesStacy J Burns, MD Primary Gastroenterologist:  none  Reason for Consultation:   Chest pain?food impaction  HPI: Margaret StadeRuth Brooks is a 82 y.o. female who was admitted through the emergency room last evening with complaints of chest pain radiating to her back onset Wednesday evening. She thinks this occurred sometime after she had eaten a meal of meatloaf. She does not discretely remember feeling that she had the sensation of food being stuck in her esophagus. She does admit that she's been having problems with belching and burping and small amounts of regurgitation recently. At time of evaluation in the emergency room she had not tried to swallow anything all day and did not feel that she could swallow anything. She was given sips of water which came back up. She underwent CT of the chest which showed the esophagus to be largely fluid and air-filled raising question of underlying dysmotility. She also had diffuse coronary calcifications several compression fractures and a mild mural thrombus in the distal aortic arch. Patient has prior history of a food impaction situation in April 2017 at which time she underwent EGD with Dr. Marcie MowersPuccini after complaints of similar symptoms. EGD showed the esophagus to be food and fluid filled in the lower one half of the esophagus after 30 minutes most of this was removed and remaining food was pushed into her stomach. She had a tortuous distal esophagus but no evidence of stricture or stenosis. She was asked to follow-up with Dr. Koleen NimrodVirginia in his office but she canceled her appointment because she said she didn't have any more difficulty and felt fine. Exciting Patient does have history of chronic antiplatelet therapy, on Plavix for carotid stenosis and prior cerebral embolism. Other problems include coronary artery disease ,arthritis, prior compression fractures, and macular degeneration.   This  morning  patient has stated that she feels fine, she is not sure whether she has anything stuck in her esophagus. Her daughter reports that she has been intermittently spitting up small amounts of phlegm. Apparently attempt at dosing one of her meds early this morning with a small amount of applesauce caused chest discomfort and she brought it directly backup.   Past Medical History:  Diagnosis Date  . ARTHRITIS   . CEREBRAL EMBOLISM, WITH INFARCTION 07/2010   mild R HP  . CORONARY ARTERY DISEASE   . Cyst of breast 2007   cyst on breast  . HYPERLIPIDEMIA   . HYPERTENSION   . MACULAR DEGENERATION   . Osteoporosis, post-menopausal 07/2012   T10 compression fx, R hip fx 04/02/14 after fall  . PERIPHERAL VASCULAR DISEASE   . Sebaceous cyst    L chin and R neck  . Subclavian steal syndrome     Past Surgical History:  Procedure Laterality Date  . BREAST SURGERY  2011   sebacious cyst removal   . ESOPHAGOGASTRODUODENOSCOPY N/A 11/25/2015   Procedure: ESOPHAGOGASTRODUODENOSCOPY (EGD);  Surgeon: Bernette Redbirdobert Buccini, MD;  Location: [facility];  Service: Endoscopy;  Laterality: N/A;  . HIP PINNING Right 04/2014  . Sabacious cyst removed from breast  2011  . TONSILLECTOMY      Prior to Admission medications   Medication Sig Start Date End Date Taking? Authorizing Provider  atenolol (TENORMIN) 25 MG tablet TAKE 1 TABLET BY MOUTH DAILY . TAKE WITH 50 MG TABLET FOR A TOTAL OF 75 MG Patient taking differently: TAKE 25 MG BY MOUTH DAILY . TAKE WITH 50 MG TABLET  FOR A TOTAL OF 75 MG 07/15/15  Yes Newt LukesValerie A Leschber, MD  atenolol (TENORMIN) 50 MG tablet TAKE 1 TABLET BY MOUTH DAILY . TAKE WITH 25 MG TABLET FOR A TOTAL OF 75 MG. Patient taking differently: TAKE 50 MG BY MOUTH DAILY . TAKE WITH 25 MG TABLET FOR A TOTAL OF 75 MG. 07/15/15  Yes Newt LukesValerie A Leschber, MD  clopidogrel (PLAVIX) 75 MG tablet Take 1 tablet (75 mg total) by mouth daily. 01/26/16  Yes Pincus SanesStacy J Burns, MD  pravastatin (PRAVACHOL) 20 MG tablet  TAKE 1 TABLET (20 MG TOTAL) BY MOUTH DAILY. 03/29/16  Yes Pincus SanesStacy J Burns, MD  risedronate (ACTONEL) 35 MG tablet Take 1 tablet (35 mg total) by mouth every 7 (seven) days. with water on empty stomach, nothing by mouth or lie down for next 30 minutes. 08/07/15  Yes Pincus SanesStacy J Burns, MD  Calcium Citrate-Vitamin D (CALCIUM CITRATE PETITE/VIT D) 200-250 MG-UNIT TABS Take 2 tablets by mouth 2 (two) times daily. 08/07/15   Pincus SanesStacy J Burns, MD    Current Facility-Administered Medications  Medication Dose Route Frequency Provider Last Rate Last Dose  . 0.9 %  sodium chloride infusion   Intravenous Continuous Hillary BowJared M Gardner, DO 75 mL/hr at 06/17/16 0341    . ciprofloxacin (CIPRO) IVPB 400 mg  400 mg Intravenous Q12H Kathlen ModyVijaya Akula, MD      . iopamidol (ISOVUE-370) 76 % injection 100 mL  100 mL Intravenous Once PRN Loren Raceravid Yelverton, MD        Allergies as of 06/16/2016 - Review Complete 06/16/2016  Allergen Reaction Noted  . Codeine  07/23/2012  . Penicillins Other (See Comments)   . Sulfonamide derivatives      Family History  Problem Relation Age of Onset  . Heart attack Father   . Cancer Mother   . Heart disease Other     Parent  . Miscarriages / Stillbirths Other   . Hyperlipidemia Brother   . Hypertension Brother   . Stroke Brother     Social History   Social History  . Marital status: Widowed    Spouse name: N/A  . Number of children: N/A  . Years of education: N/A   Occupational History  . Retired from [employer]    Social History Main Topics  . Smoking status: Former Smoker    Packs/day: 1.00    Years: 61.00    Quit date: 09/29/2009  . Smokeless tobacco: Never Used  . Alcohol use 0.0 oz/week     Comment: Occasional  . Drug use: No  . Sexual activity: No   Other Topics Concern  . Not on file   Social History Narrative   Lives in [facility] Facility. [place] near [place]    Review of Systems: Pertinent positive and negative  review of systems were noted in the above HPI section.  All other review of systems was otherwise negative.  Physical Exam: Vital signs in last 24 hours: Temp:  [97.6 F (36.4 C)-97.8 F (36.6 C)] 97.7 F (36.5 C) (11/17 0422) Pulse Rate:  [64-80] 74 (11/17 0422) Resp:  [14-24] 15 (11/17 0422) BP: (149-186)/(71-93) 149/78 (11/17 0422) SpO2:  [92 %-100 %] 98 % (11/17 0422) Weight:  [118 lb (53.5 kg)] 118 lb (53.5 kg) (11/16 1516) Last BM Date: 06/14/16 General:   Alert,  Well-developed, well-nourished, elderly pleasant and cooperative in NAD Head:  Normocephalic and atraumatic. Eyes:  Sclera clear, no icterus.  Conjunctiva pink. Ears:  Normal auditory acuity. Nose:  No deformity, discharge,  or lesions. Mouth:  No deformity or lesions.   Neck:  Supple; no masses or thyromegaly. Lungs:  Clear throughout to auscultation.   No wheezes, crackles, or rhonchi. Heart:  Regular rate and rhythm; no murmurs, clicks, rubs,  or gallops. Abdomen:  Soft,nontender, BS active,nonpalp mass or hsm.   Rectal:  Deferred  Msk:  Symmetrical without gross deformities. . Pulses:  Normal pulses noted. Extremities:  Without clubbing or edema. Neurologic:  Alert and  oriented x4;  grossly normal neurologically. Skin:  Intact without significant lesions or rashes.. Psych:  Alert and cooperative. Normal mood and affect.  Intake/Output from previous day: 11/16 0701 - 11/17 0700 In: 673.8 [I.V.:173.8; IV Piggyback:500] Out: -  Intake/Output this shift: Total I/O In: 300 [I.V.:300] Out: -   Lab Results:  Recent Labs  06/16/16 1539  WBC 8.6  HGB 14.7  HCT 45.5  PLT 316   BMET  Recent Labs  06/16/16 1539  NA 140  K 3.8  CL 105  CO2 25  GLUCOSE 109*  BUN 28*  CREATININE 0.89  CALCIUM 9.7   LFT No results for input(s): PROT, ALBUMIN, AST, ALT, ALKPHOS, BILITOT, BILIDIR, IBILI in the last 72 hours. PT/INR No results for input(s): LABPROT, INR in the last 72 hours. Hepatitis Panel No  results for input(s): HEPBSAG, HCVAB, HEPAIGM, HEPBIGM in the last 72 hours.      IMPRESSION:  #84 82 year old female with history of food impaction April 2017 who at EGD had a food and fluid filled lower esophagus and a tortuous distal esophagus without evidence of stricture or mass or stenosis. Patient admitted last night with 24 hour history of chest discomfort radiating into her back which started at some point after she had eaten a meal of meatloaf. She was initially unable to swallow even water. On CT she has a fluid and possibly food filled esophagus. Exline Patient has had no complaints of chest discomfort today however has continued to spit up mucus and was unable to swallow a pill in applesauce so suspect she probably does have a food impaction. She clearly has an underlying motility disorder, question achalasia #2 chronic antiplatelet therapy, on Plavix last dose yesterday #3 peripheral vascular disease #4 history of cerebrovascular disease with cerebral embolism 2011 #5 coronary artery disease # 6 hypertension  Plan; keep nothing by mouth We'll proceed with EGD this afternoon per Dr. Rhea Belton. Procedure discussed in detail with patient and her daughter including risks and benefits and they are agreeable to proceed Patient will not be dilated as she has been on Plavix, she will need barium swallow, and may benefit from Botox if consistent with achalasia but this would best be done with her off Plavix      Amy Esterwood  06/17/2016, 12:41 PM

## 2016-06-17 NOTE — Progress Notes (Signed)
[name] is a 82 y.o. female with medical history significant of Esophageal food impaction in April of this year, concern for underlying esophageal dysmotility with tortuous esophagus per EGD note at that time.  After having EGD and food impaction removed patient didn't have any further symptoms or trouble until yesterday . She repors similar complaints and came to ED for further evaluation.  GI consulted and plan for EGD today.  Margaret ModyVijaya Cecilio Ohlrich, MD [phone]

## 2016-06-17 NOTE — Op Note (Signed)
[facility] Patient Name: Margaret StadeRuth Brooks Procedure Date: 06/17/2016 MRN: [id number] Attending MD: Beverley FiedlerJay M Yuka Lallier , MD Date of Birth: 06/18/1922 CSN: [id number] Age: [age] Admit Type: Inpatient Procedure:                Upper GI endoscopy Indications:              Dysphagia, Foreign body in the esophagus, Chest                            pain (non cardiac) Providers:                Carie CaddyJay M. Rhea BeltonPyrtle, MD, Will BonnetKatie Winchester RN, RN, Oletha Blendavida                            Shoffner, Technician Referring MD:             [facility] Medicines:                Monitored Anesthesia Care Complications:            No immediate complications. Estimated Blood Loss:     Estimated blood loss: none. Procedure:                Pre-Anesthesia Assessment:                           - Prior to the procedure, a History and Physical                            was performed, and patient medications and                            allergies were reviewed. The patient's tolerance of                            previous anesthesia was also reviewed. The risks                            and benefits of the procedure and the sedation                            options and risks were discussed with the patient.                            All questions were answered, and informed consent                            was obtained. Prior Anticoagulants: The patient has                            taken Plavix (clopidogrel), last dose was 1 day                            prior to procedure. ASA Grade Assessment: III - A  patient with severe systemic disease. After                            reviewing the risks and benefits, the patient was                            deemed in satisfactory condition to undergo the                            procedure.                           After obtaining informed consent, the endoscope was                            passed under direct vision.  Throughout the                            procedure, the patient's blood pressure, pulse, and                            oxygen saturations were monitored continuously. The                            was introduced through the mouth, and advanced to                            the second part of duodenum. The upper GI endoscopy                            was accomplished without difficulty. The patient                            tolerated the procedure well. Scope In: Scope Out: Findings:      Food was found in the upper third of the esophagus and in the middle       third of the esophagus. Removal of food was accomplished with Lucina Mellow net       and some was able to be carefully and slowly advanced into the stomach.      Mildly severe esophagitis with no bleeding was found 32 to 35 cm from       the incisors felt due to pressure injury from food impaction.      The examined esophagus was significantly tortuous without definitive       striciture seen.      The entire examined stomach was normal.      Localized moderate inflammation characterized by erythema and aphthous       ulcerations was found in the duodenal bulb. No bleeding.      The second portion of the duodenum was normal. Impression:               - Food in the upper third of the esophagus and in                            the middle third of the esophagus. Removal  was                            successful.                           - Mildly severe acute esophagitis due to food                            impaction.                           - Tortuous esophagus suggestive of chronic                            dysmolity.                           - Normal stomach.                           - Ulcerative duodenitis.                           - Normal second portion of the duodenum. Moderate Sedation:      N/A Recommendation:           - Return patient to hospital ward for ongoing care.                           - Full liquid  diet. I would advance diet past full                            liquids until further evaluation.                           - Barium esophagram and possible esophageal                            manometry recommended. This can be done as an                            outpatient.                           - Dailly PPI x 1 month given esophagitis.                           - Check H. Pylori Ab and treat if positive                           - No NSAIDs                           - Continue present medications. Procedure Code(s):        --- Professional ---                           [phone],  Esophagogastroduodenoscopy, flexible,                            transoral; with removal of foreign body(s) Diagnosis Code(s):        --- Professional ---                           Q25.956LT18.128A, Food in esophagus causing other injury,                            initial encounter                           K20.9, Esophagitis, unspecified                           Q39.9, Congenital malformation of esophagus,                            unspecified                           K29.80, Duodenitis without bleeding                           R13.10, Dysphagia, unspecified                           T18.108A, Unspecified foreign body in esophagus                            causing other injury, initial encounter                           R07.89, Other chest pain CPT copyright 2016 [employer]. All rights reserved. The codes documented in this report are preliminary and upon coder review may  be revised to meet current compliance requirements. Beverley FiedlerJay M Latonya Nelon, MD 06/17/2016 4:06:29 PM This report has been signed electronically. Number of Addenda: 0

## 2016-06-17 NOTE — H&P (Signed)
History and Physical    Xee Hollman WJX:[id number] DOB: [birth year] DOA: 06/16/2016   PCP: Pincus Sanes, MD Chief Complaint:  Chief Complaint  Patient presents with  . Chest Pain    HPI: [name] is a 82 y.o. female with medical history significant of Esophageal food impaction in April of this year, concern for underlying esophageal dysmotility with tortuous esophagus per EGD note at that time.  After having EGD and food impaction removed patient didn't have any further symptoms or trouble until yesterday and as such didn't follow up with GI for barium swallow.  Yesterday she developed throacic central chest pain with radiation to her back.  Symptoms are associated with difficulty swallowing.  She will "belch" and what ever she just swallowed will come right back up.  She is unable to even keep down a sip of water at this point.  ED Course: See CT scan for details.  Glucagon attempted without relief of esophageal dysmotility / impaction.  Review of Systems: As per HPI otherwise 10 point review of systems negative.    Past Medical History:  Diagnosis Date  . ARTHRITIS   . CEREBRAL EMBOLISM, WITH INFARCTION 07/2010   mild R HP  . CORONARY ARTERY DISEASE   . Cyst of breast 2007   cyst on breast  . HYPERLIPIDEMIA   . HYPERTENSION   . MACULAR DEGENERATION   . Osteoporosis, post-menopausal 07/2012   T10 compression fx, R hip fx 04/02/14 after fall  . PERIPHERAL VASCULAR DISEASE   . Sebaceous cyst    L chin and R neck  . Subclavian steal syndrome     Past Surgical History:  Procedure Laterality Date  . BREAST SURGERY  2011   sebacious cyst removal   . ESOPHAGOGASTRODUODENOSCOPY N/A 11/25/2015   Procedure: ESOPHAGOGASTRODUODENOSCOPY (EGD);  Surgeon: Bernette Redbird, MD;  Location: [facility];  Service: Endoscopy;  Laterality: N/A;  . HIP PINNING Right 04/2014  . Sabacious cyst removed from breast  2011  . TONSILLECTOMY       reports that she quit smoking about 6 years  ago. She has a 61.00 pack-year smoking history. She has never used smokeless tobacco. She reports that she drinks alcohol. She reports that she does not use drugs.  Allergies  Allergen Reactions  . Codeine     Unknown  . Penicillins Other (See Comments)    Reaction: unknown  Has patient had a PCN reaction causing immediate rash, facial/tongue/throat swelling, SOB or lightheadedness with hypotension: n/a Has patient had a PCN reaction causing severe rash involving mucus membranes or skin necrosis: n/a Has patient had a PCN reaction that required hospitalization: n/a Has patient had a PCN reaction occurring within the last 10 years: n/a If all of the above answers are "NO", then may proceed with Cephalosporin use.   . Sulfonamide Derivatives     Pt states that it was so long ago that she does not remember what happened.     Family History  Problem Relation Age of Onset  . Heart attack Father   . Cancer Mother   . Heart disease Other     Parent  . Miscarriages / Stillbirths Other   . Hyperlipidemia Brother   . Hypertension Brother   . Stroke Brother       Prior to Admission medications   Medication Sig Start Date End Date Taking? Authorizing Provider  atenolol (TENORMIN) 25 MG tablet TAKE 1 TABLET BY MOUTH DAILY . TAKE WITH 50 MG TABLET FOR A TOTAL  OF 75 MG Patient taking differently: TAKE 25 MG BY MOUTH DAILY . TAKE WITH 50 MG TABLET FOR A TOTAL OF 75 MG 07/15/15  Yes Newt Lukes, MD  atenolol (TENORMIN) 50 MG tablet TAKE 1 TABLET BY MOUTH DAILY . TAKE WITH 25 MG TABLET FOR A TOTAL OF 75 MG. Patient taking differently: TAKE 50 MG BY MOUTH DAILY . TAKE WITH 25 MG TABLET FOR A TOTAL OF 75 MG. 07/15/15  Yes Newt Lukes, MD  clopidogrel (PLAVIX) 75 MG tablet Take 1 tablet (75 mg total) by mouth daily. 01/26/16  Yes Pincus Sanes, MD  pravastatin (PRAVACHOL) 20 MG tablet TAKE 1 TABLET (20 MG TOTAL) BY MOUTH DAILY. 03/29/16  Yes Pincus Sanes, MD  risedronate (ACTONEL) 35  MG tablet Take 1 tablet (35 mg total) by mouth every 7 (seven) days. with water on empty stomach, nothing by mouth or lie down for next 30 minutes. 08/07/15  Yes Pincus Sanes, MD  Calcium Citrate-Vitamin D (CALCIUM CITRATE PETITE/VIT D) 200-250 MG-UNIT TABS Take 2 tablets by mouth 2 (two) times daily. 08/07/15   Pincus Sanes, MD    Physical Exam: Vitals:   06/16/16 2000 06/16/16 2105 06/16/16 2201 06/17/16 0024  BP:   149/71 149/89  Pulse:  78 79 80  Resp: 20 19 18 16   Temp:   97.6 F (36.4 C)   TempSrc:   Oral   SpO2:  92% 92% 95%  Weight:      Height:          Constitutional: NAD, calm, comfortable Eyes: PERRL, lids and conjunctivae normal ENMT: Mucous membranes are moist. Posterior pharynx clear of any exudate or lesions.Normal dentition.  Neck: normal, supple, no masses, no thyromegaly Respiratory: clear to auscultation bilaterally, no wheezing, no crackles. Normal respiratory effort. No accessory muscle use.  Cardiovascular: Regular rate and rhythm, no murmurs / rubs / gallops. No extremity edema. 2+ pedal pulses. No carotid bruits.  Abdomen: no tenderness, no masses palpated. No hepatosplenomegaly. Bowel sounds positive.  Musculoskeletal: no clubbing / cyanosis. No joint deformity upper and lower extremities. Good ROM, no contractures. Normal muscle tone.  Skin: no rashes, lesions, ulcers. No induration Neurologic: CN 2-12 grossly intact. Sensation intact, DTR normal. Strength 5/5 in all 4.  Psychiatric: Normal judgment and insight. Alert and oriented x 3. Normal mood.    Labs on Admission: I have personally reviewed following labs and imaging studies  CBC:  Recent Labs Lab 06/16/16 1539  WBC 8.6  HGB 14.7  HCT 45.5  MCV 89.2  PLT 316   Basic Metabolic Panel:  Recent Labs Lab 06/16/16 1539  NA 140  K 3.8  CL 105  CO2 25  GLUCOSE 109*  BUN 28*  CREATININE 0.89  CALCIUM 9.7   GFR: Estimated Creatinine Clearance: 31.9 mL/min (by C-G formula based on SCr  of 0.89 mg/dL). Liver Function Tests: No results for input(s): AST, ALT, ALKPHOS, BILITOT, PROT, ALBUMIN in the last 168 hours. No results for input(s): LIPASE, AMYLASE in the last 168 hours. No results for input(s): AMMONIA in the last 168 hours. Coagulation Profile: No results for input(s): INR, PROTIME in the last 168 hours. Cardiac Enzymes: No results for input(s): CKTOTAL, CKMB, CKMBINDEX, TROPONINI in the last 168 hours. BNP (last 3 results) No results for input(s): PROBNP in the last 8760 hours. HbA1C: No results for input(s): HGBA1C in the last 72 hours. CBG: No results for input(s): GLUCAP in the last 168 hours. Lipid Profile: No  results for input(s): CHOL, HDL, LDLCALC, TRIG, CHOLHDL, LDLDIRECT in the last 72 hours. Thyroid Function Tests: No results for input(s): TSH, T4TOTAL, FREET4, T3FREE, THYROIDAB in the last 72 hours. Anemia Panel: No results for input(s): VITAMINB12, FOLATE, FERRITIN, TIBC, IRON, RETICCTPCT in the last 72 hours. Urine analysis:    Component Value Date/Time   COLORURINE AMBER (A) 06/16/2016 1924   APPEARANCEUR CLOUDY (A) 06/16/2016 1924   LABSPEC 1.024 06/16/2016 1924   PHURINE 5.5 06/16/2016 1924   GLUCOSEU NEGATIVE 06/16/2016 1924   HGBUR TRACE (A) 06/16/2016 1924   BILIRUBINUR SMALL (A) 06/16/2016 1924   KETONESUR NEGATIVE 06/16/2016 1924   PROTEINUR NEGATIVE 06/16/2016 1924   UROBILINOGEN 0.2 11/30/2014 1744   NITRITE NEGATIVE 06/16/2016 1924   LEUKOCYTESUR MODERATE (A) 06/16/2016 1924   Sepsis Labs: @LABRCNTIP (procalcitonin:4,lacticidven:4) )No results found for this or any previous visit (from the past 240 hour(s)).   Radiological Exams on Admission: Dg Chest 2 View  Result Date: 06/16/2016 CLINICAL DATA:  Central chest pain, onset last night. EXAM: CHEST  2 VIEW COMPARISON:  Radiograph 11/25/2015.  Chest CT 11/30/2014 FINDINGS: Stable mediastinal contours with tortuous calcified thoracic aorta. No pulmonary edema or congestive  failure. Minimal right basilar atelectasis or scarring. No focal airspace disease, pleural fluid or pneumothorax. Multiple compression deformities in the thoracic spine, suboptimally assessed due to background osteopenia. IMPRESSION: 1. No acute abnormality. 2. Tortuous atherosclerotic thoracic aorta, unchanged radiographic appearance. Electronically Signed   By: Rubye OaksMelanie  Ehinger M.D.   On: 06/16/2016 15:46   Ct Chest W Contrast  Result Date: 06/17/2016 CLINICAL DATA:  Acute onset of central chest pain and back pain. Shortness of breath and nausea. Initial encounter. EXAM: CT CHEST WITH CONTRAST TECHNIQUE: Multidetector CT imaging of the chest was performed during intravenous contrast administration. CONTRAST:  75mL ISOVUE-300 IOPAMIDOL (ISOVUE-300) INJECTION 61% COMPARISON:  Chest radiograph performed earlier today at 3:30 p.m., and CTA of the chest performed 11/30/2014 FINDINGS: Cardiovascular: Scattered calcification is noted along the thoracic aorta, with mild mural thrombus along the distal the aortic arch and descending thoracic aorta, and also along the right innominate artery. 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 The ascending thoracic aorta measures up to 4.1 cm in AP dimension. Given the patient's age, this remains within normal limits. Diffuse coronary artery calcifications are seen. Calcification is noted at the aortic valve. Mediastinum/Nodes: The esophagus is filled with fluid and air, raising question for mild esophageal dysmotility. No mediastinal lymphadenopathy is seen. No pericardial effusion is identified. The thyroid gland is unremarkable in appearance. No axillary lymphadenopathy is seen. Lungs/Pleura: Bilateral emphysematous change is noted. Mild scarring is noted at the right lung base, with associated chronic pleural thickening. No significant pleural effusion or pneumothorax is seen. No masses are identified. Upper Abdomen: The  visualized portions of the liver and spleen are unremarkable. The visualized portions of the pancreas, left adrenal gland right kidney are within normal limits. Mild left renal atrophy is suggested, though this is difficult to fully assess due to motion artifact. A 2.2 cm right adrenal nodule is again noted. Musculoskeletal: No acute osseous abnormalities are identified. Chronic compression deformities are noted at T6, T7 and T10. The visualized musculature is unremarkable in appearance. IMPRESSION: 1. Esophagus largely filled with fluid and air, raising concern for mild esophageal dysmotility. 2. Mild mural thrombus along the distal aortic arch and descending thoracic aorta, and also along the right innominate artery. 3. 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 This is better characterized than in 2016. 4. Diffuse coronary artery calcification noted. Calcification at the aortic valve. 5. Bilateral emphysematous change. Mild scarring at the right lung base, with associated chronic pleural thickening. 6. Mild left renal atrophy suggested, not well characterized. 7. 2.2 cm right adrenal nodule is stable from 2016 and likely benign. 8. Chronic compression deformities at T6, T7 and T10. Electronically Signed   By: Roanna RaiderJeffery  Chang M.D.   On: 06/17/2016 00:43    EKG: Independently reviewed.  Assessment/Plan Principal Problem:   Esophageal dysmotility Active Problems:   Essential hypertension    1. Esophageal dysmotility / food impaction - 1. GI called by EDP and they will see patient in AM 2. Admitting patient in mean time 3. NPO 4. Currently pain free until she tries to swallow something 5. NS to prevent dehydration 2. HTN -  1. NPO including meds for now 2. Resume home meds as soon as okayed by GI in AM.   DVT prophylaxis: SCDs Code Status: Full Family Communication: Daughter at bedside Consults called: Dr. Georgiann CockerBrahmbatt called by  EDP Admission status: Admit to inpatient   Hillary BowGARDNER, Lanice Folden M. DO [facility] Pager [phone] from 7PM-7AM  If 7AM-7PM, please contact the day physician for the patient [email or web address] Password [email or web address]  06/17/2016, 3:34 AM

## 2016-06-17 NOTE — Anesthesia Procedure Notes (Signed)
Procedure Name: Intubation Date/Time: 06/17/2016 3:26 PM Performed by: Thornell MuleSTUBBLEFIELD, Jestina Stephani G Pre-anesthesia Checklist: Patient identified, Emergency Drugs available, Suction available and Patient being monitored Patient Re-evaluated:Patient Re-evaluated prior to inductionOxygen Delivery Method: Circle system utilized Preoxygenation: Pre-oxygenation with 100% oxygen Intubation Type: IV induction Ventilation: Mask ventilation without difficulty Laryngoscope Size: Miller and 3 Grade View: Grade I Tube type: Oral Tube size: 7.0 mm Number of attempts: 1 Airway Equipment and Method: Stylet and Oral airway Placement Confirmation: ETT inserted through vocal cords under direct vision,  positive ETCO2 and breath sounds checked- equal and bilateral Secured at: 19 cm Tube secured with: Tape Dental Injury: Teeth and Oropharynx as per pre-operative assessment

## 2016-06-17 NOTE — Anesthesia Postprocedure Evaluation (Signed)
Anesthesia Post Note  Patient: Margaret StadeRuth Brabender  Procedure(s) Performed: Procedure(s) (LRB): ESOPHAGOGASTRODUODENOSCOPY (EGD) WITH PROPOFOL (N/A)  Patient location during evaluation: PACU Anesthesia Type: General Level of consciousness: sedated and patient cooperative Pain management: pain level controlled Vital Signs Assessment: post-procedure vital signs reviewed and stable Respiratory status: spontaneous breathing Cardiovascular status: stable Anesthetic complications: no    Last Vitals:  Vitals:   06/17/16 1610 06/17/16 1620  BP: (!) 203/104 (!) 174/96  Pulse: 85 84  Resp: 18 (!) 23  Temp:      Last Pain:  Vitals:   06/17/16 1442  TempSrc: Oral  PainSc:                  Lewie LoronJohn Kirtan Sada

## 2016-06-17 NOTE — Transfer of Care (Signed)
Immediate Anesthesia Transfer of Care Note  Patient: [name]  Procedure(s) Performed: Procedure(s): ESOPHAGOGASTRODUODENOSCOPY (EGD) WITH PROPOFOL (N/A)  Patient Location: PACU  Anesthesia Type:General  Level of Consciousness: awake, alert  and oriented  Airway & Oxygen Therapy: Patient Spontanous Breathing and Patient connected to face mask oxygen  Post-op Assessment: Report given to RN and Post -op Vital signs reviewed and stable  Post vital signs: Reviewed and stable  Last Vitals:  Vitals:   06/17/16 0422 06/17/16 1442  BP: (!) 149/78 (!) 143/102  Pulse: 74 84  Resp: 15 16  Temp: 36.5 C 36.5 C    Last Pain:  Vitals:   06/17/16 1442  TempSrc: Oral  PainSc:          Complications: No apparent anesthesia complications

## 2016-06-17 NOTE — Anesthesia Preprocedure Evaluation (Addendum)
Anesthesia Evaluation  Patient identified by MRN, date of birth, ID band Patient awake    Reviewed: Allergy & Precautions, H&P , NPO status , Patient's Chart, lab work & pertinent test results, reviewed documented beta blocker date and time   Airway Mallampati: II  TM Distance: >3 FB Neck ROM: Full    Dental no notable dental hx. (+) Teeth Intact, Dental Advisory Given   Pulmonary neg pulmonary ROS, former smoker,    Pulmonary exam normal breath sounds clear to auscultation       Cardiovascular hypertension, Pt. on medications and Pt. on home beta blockers + CAD and + Peripheral Vascular Disease   Rhythm:Regular Rate:Normal     Neuro/Psych CVA, No Residual Symptoms negative psych ROS   GI/Hepatic negative GI ROS, Neg liver ROS,   Endo/Other  negative endocrine ROS  Renal/GU negative Renal ROS  negative genitourinary   Musculoskeletal  (+) Arthritis , Osteoarthritis,    Abdominal   Peds  Hematology negative hematology ROS (+)   Anesthesia Other Findings   Reproductive/Obstetrics negative OB ROS                            Anesthesia Physical Anesthesia Plan  ASA: III  Anesthesia Plan: General   Post-op Pain Management:    Induction: Intravenous, Rapid sequence and Cricoid pressure planned  Airway Management Planned: Oral ETT  Additional Equipment:   Intra-op Plan:   Post-operative Plan: Extubation in OR  Informed Consent: I have reviewed the patients History and Physical, chart, labs and discussed the procedure including the risks, benefits and alternatives for the proposed anesthesia with the patient or authorized representative who has indicated his/her understanding and acceptance.   Dental advisory given  Plan Discussed with: CRNA  Anesthesia Plan Comments:        Anesthesia Quick Evaluation

## 2016-06-18 DIAGNOSIS — T18128A Food in esophagus causing other injury, initial encounter: Secondary | ICD-10-CM | POA: Diagnosis not present

## 2016-06-18 DIAGNOSIS — K222 Esophageal obstruction: Secondary | ICD-10-CM | POA: Diagnosis not present

## 2016-06-18 DIAGNOSIS — I10 Essential (primary) hypertension: Secondary | ICD-10-CM

## 2016-06-18 DIAGNOSIS — K224 Dyskinesia of esophagus: Secondary | ICD-10-CM | POA: Diagnosis not present

## 2016-06-18 MED ORDER — PANTOPRAZOLE SODIUM 40 MG PO TBEC: 40.0000 mg | DELAYED_RELEASE_TABLET | Freq: Every day | ORAL | 0 refills | Status: AC

## 2016-06-18 MED ORDER — PANTOPRAZOLE SODIUM 40 MG PO TBEC: 40.0000 mg | DELAYED_RELEASE_TABLET | Freq: Every day | ORAL | Status: DC

## 2016-06-18 NOTE — Progress Notes (Signed)
Pt is refusing her medication CIPRO states she "does not have a bladder infection."   Dwight Burdo R McClean

## 2016-06-18 NOTE — Progress Notes (Signed)
Patient has refused cipro and all medications.

## 2016-06-18 NOTE — Progress Notes (Signed)
Pt was given discharge instructions and medication questions were answered. Pt was taken via wheelchair to front entrance by NT.   [name]

## 2016-06-19 LAB — URINE CULTURE: Culture: 40000 — AB

## 2016-06-20 ENCOUNTER — Emergency Department (HOSPITAL_COMMUNITY): Payer: Medicare HMO

## 2016-06-20 ENCOUNTER — Encounter (HOSPITAL_COMMUNITY): Payer: Self-pay | Admitting: Internal Medicine

## 2016-06-20 ENCOUNTER — Encounter (HOSPITAL_COMMUNITY): Admission: EM | Disposition: E | Payer: Self-pay | Source: Home / Self Care | Attending: Cardiovascular Disease

## 2016-06-20 DIAGNOSIS — I739 Peripheral vascular disease, unspecified: Secondary | ICD-10-CM | POA: Diagnosis present

## 2016-06-20 DIAGNOSIS — I2109 ST elevation (STEMI) myocardial infarction involving other coronary artery of anterior wall: Secondary | ICD-10-CM | POA: Diagnosis not present

## 2016-06-20 DIAGNOSIS — Z823 Family history of stroke: Secondary | ICD-10-CM

## 2016-06-20 DIAGNOSIS — I959 Hypotension, unspecified: Secondary | ICD-10-CM | POA: Diagnosis not present

## 2016-06-20 DIAGNOSIS — M549 Dorsalgia, unspecified: Secondary | ICD-10-CM | POA: Diagnosis present

## 2016-06-20 DIAGNOSIS — Z882 Allergy status to sulfonamides status: Secondary | ICD-10-CM

## 2016-06-20 DIAGNOSIS — K922 Gastrointestinal hemorrhage, unspecified: Secondary | ICD-10-CM | POA: Diagnosis not present

## 2016-06-20 DIAGNOSIS — M25559 Pain in unspecified hip: Secondary | ICD-10-CM | POA: Diagnosis present

## 2016-06-20 DIAGNOSIS — Z66 Do not resuscitate: Secondary | ICD-10-CM | POA: Diagnosis present

## 2016-06-20 DIAGNOSIS — Z79899 Other long term (current) drug therapy: Secondary | ICD-10-CM

## 2016-06-20 DIAGNOSIS — E785 Hyperlipidemia, unspecified: Secondary | ICD-10-CM | POA: Diagnosis present

## 2016-06-20 DIAGNOSIS — I2102 ST elevation (STEMI) myocardial infarction involving left anterior descending coronary artery: Secondary | ICD-10-CM | POA: Diagnosis not present

## 2016-06-20 DIAGNOSIS — Z87891 Personal history of nicotine dependence: Secondary | ICD-10-CM

## 2016-06-20 DIAGNOSIS — M1990 Unspecified osteoarthritis, unspecified site: Secondary | ICD-10-CM | POA: Diagnosis present

## 2016-06-20 DIAGNOSIS — I2510 Atherosclerotic heart disease of native coronary artery without angina pectoris: Secondary | ICD-10-CM | POA: Diagnosis present

## 2016-06-20 DIAGNOSIS — K209 Esophagitis, unspecified: Secondary | ICD-10-CM | POA: Diagnosis present

## 2016-06-20 DIAGNOSIS — I213 ST elevation (STEMI) myocardial infarction of unspecified site: Secondary | ICD-10-CM | POA: Diagnosis present

## 2016-06-20 DIAGNOSIS — Z515 Encounter for palliative care: Secondary | ICD-10-CM | POA: Diagnosis present

## 2016-06-20 DIAGNOSIS — I779 Disorder of arteries and arterioles, unspecified: Secondary | ICD-10-CM | POA: Diagnosis present

## 2016-06-20 DIAGNOSIS — Z8673 Personal history of transient ischemic attack (TIA), and cerebral infarction without residual deficits: Secondary | ICD-10-CM

## 2016-06-20 DIAGNOSIS — Z8249 Family history of ischemic heart disease and other diseases of the circulatory system: Secondary | ICD-10-CM

## 2016-06-20 DIAGNOSIS — Z885 Allergy status to narcotic agent status: Secondary | ICD-10-CM

## 2016-06-20 DIAGNOSIS — Z029 Encounter for administrative examinations, unspecified: Secondary | ICD-10-CM

## 2016-06-20 DIAGNOSIS — Z7902 Long term (current) use of antithrombotics/antiplatelets: Secondary | ICD-10-CM

## 2016-06-20 DIAGNOSIS — R570 Cardiogenic shock: Secondary | ICD-10-CM | POA: Diagnosis present

## 2016-06-20 DIAGNOSIS — I10 Essential (primary) hypertension: Secondary | ICD-10-CM | POA: Diagnosis present

## 2016-06-20 DIAGNOSIS — R011 Cardiac murmur, unspecified: Secondary | ICD-10-CM

## 2016-06-20 LAB — I-STAT TROPONIN, ED: TROPONIN I, POC: 1.19 ng/mL — AB (ref 0.00–0.08)

## 2016-06-20 LAB — CBC
HCT: 41.8 % (ref 36.0–46.0)
Hemoglobin: 13.5 g/dL (ref 12.0–15.0)
MCH: 28.8 pg (ref 26.0–34.0)
MCHC: 32.3 g/dL (ref 30.0–36.0)
MCV: 89.3 fL (ref 78.0–100.0)
PLATELETS: 304 10*3/uL (ref 150–400)
RBC: 4.68 MIL/uL (ref 3.87–5.11)
RDW: 16 % — ABNORMAL HIGH (ref 11.5–15.5)
WBC: 11.7 10*3/uL — ABNORMAL HIGH (ref 4.0–10.5)

## 2016-06-20 LAB — BASIC METABOLIC PANEL
Anion gap: 9 (ref 5–15)
BUN: 24 mg/dL — ABNORMAL HIGH (ref 6–20)
CALCIUM: 9.3 mg/dL (ref 8.9–10.3)
CO2: 24 mmol/L (ref 22–32)
CREATININE: 0.86 mg/dL (ref 0.44–1.00)
Chloride: 106 mmol/L (ref 101–111)
GFR calc non Af Amer: 57 mL/min — ABNORMAL LOW (ref >60)
GLUCOSE: 154 mg/dL — AB (ref 65–99)
Potassium: 3.8 mmol/L (ref 3.5–5.1)
Sodium: 139 mmol/L (ref 135–145)

## 2016-06-20 SURGERY — LEFT HEART CATH AND CORONARY ANGIOGRAPHY

## 2016-06-20 MED ORDER — SODIUM CHLORIDE 0.9 % IJ SOLN
INTRAMUSCULAR | Status: AC
  Administered 2016-06-20: 23:00:00
  Filled 2016-06-20: qty 50

## 2016-06-20 MED ORDER — HEPARIN (PORCINE) IN NACL 100-0.45 UNIT/ML-% IJ SOLN
600.0000 [IU]/h | INTRAMUSCULAR | Status: DC
  Administered 2016-06-20: 2500 [IU]/h via INTRAVENOUS
  Administered 2016-06-21: 650 [IU]/h via INTRAVENOUS
  Filled 2016-06-20 (×2): qty 250

## 2016-06-20 MED ORDER — ASPIRIN 81 MG PO CHEW
CHEWABLE_TABLET | ORAL | Status: AC
  Filled 2016-06-20: qty 4

## 2016-06-20 MED ORDER — ASPIRIN 81 MG PO CHEW
324.0000 mg | CHEWABLE_TABLET | Freq: Once | ORAL | Status: AC
  Administered 2016-06-20: 324 mg via ORAL

## 2016-06-20 MED ORDER — HEPARIN BOLUS VIA INFUSION
3200.0000 [IU] | Freq: Once | INTRAVENOUS | Status: DC
Start: 2016-06-20 — End: 2016-06-21
  Filled 2016-06-20: qty 3200

## 2016-06-20 MED ORDER — IOPAMIDOL (ISOVUE-370) INJECTION 76%: 100.0000 mL | Freq: Once | INTRAVENOUS | Status: DC | PRN

## 2016-06-20 MED ORDER — MORPHINE SULFATE (PF) 2 MG/ML IV SOLN
INTRAVENOUS | Status: AC
  Administered 2016-06-20: 2 mg via INTRAVENOUS
  Filled 2016-06-20: qty 1

## 2016-06-20 NOTE — ED Triage Notes (Signed)
Pt c/o chest and back pain; pt states this pain is similar to the pain she was evaluated for several days ago for which she had an endoscopy; states they found food present when they did the endoscopy; pt says she ate clear soup, "mushy" mac and cheese, and scrambled eggs earlier tonight

## 2016-06-20 NOTE — ED Notes (Signed)
Pt transported to CT via stretcher.  

## 2016-06-20 NOTE — ED Provider Notes (Signed)
[facility]-EMERGENCY DEPT Provider Note   CSN: [id number] Arrival date & time: Jun 22, 2016  2214     History   Chief Complaint Chief Complaint  Patient presents with  . Chest Pain    HPI [name] is a 82 y.o. female.Level V caveat acuity of situation complains of upper back pain radiating to anterior chest onset 6 PM today pain waxes and wanes not made better or worse by anything moderate at present. Treated herself with Plavix earlier today. No other associated symptoms. Patient has never had similar pain before. Brought by private vehicle  HPI  Past Medical History:  Diagnosis Date  . ARTHRITIS   . CEREBRAL EMBOLISM, WITH INFARCTION 07/2010   mild R HP  . CORONARY ARTERY DISEASE   . Cyst of breast 2007   cyst on breast  . HYPERLIPIDEMIA   . HYPERTENSION   . MACULAR DEGENERATION   . Osteoporosis, post-menopausal 07/2012   T10 compression fx, R hip fx 04/02/14 after fall  . PERIPHERAL VASCULAR DISEASE   . Sebaceous cyst    L chin and R neck  . Subclavian steal syndrome     Patient Active Problem List   Diagnosis Date Noted  . Esophageal dysmotility 06/17/2016  . Atypical chest pain   . Esophageal obstruction due to food impaction   . Leg length discrepancy 02/19/2016  . Primary osteoarthritis of right knee 02/05/2016  . Carotid artery disease (HCC) 08/08/2015  . Compression fracture of thoracic vertebra (HCC) 12/03/2014  . Intractable back pain 11/30/2014  . Hyperglycemia 07/10/2013  . Osteoporosis with fracture   . SEBACEOUS CYST 10/12/2010  . MACULAR DEGENERATION 07/13/2010  . 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 SUBCLAVIAN STEAL SYNDROME 07/13/2010  . PERIPHERAL VASCULAR DISEASE 07/13/2010  . Hyperlipidemia 07/12/2010  . Essential hypertension 07/12/2010  . Coronary atherosclerosis 07/12/2010    Past Surgical History:  Procedure Laterality Date  . BREAST SURGERY  2011   sebacious cyst removal   . ESOPHAGOGASTRODUODENOSCOPY N/A 11/25/2015   Procedure: ESOPHAGOGASTRODUODENOSCOPY (EGD);  Surgeon: Bernette Redbird, MD;  Location: [facility];  Service: Endoscopy;  Laterality: N/A;  . ESOPHAGOGASTRODUODENOSCOPY (EGD) WITH PROPOFOL N/A 06/17/2016   Procedure: ESOPHAGOGASTRODUODENOSCOPY (EGD) WITH PROPOFOL;  Surgeon: Beverley Fiedler, MD;  Location: [facility];  Service: Endoscopy;  Laterality: N/A;  . HIP PINNING Right 04/2014  . Sabacious cyst removed from breast  2011  . TONSILLECTOMY      OB History    No data available       Home Medications    Prior to Admission medications   Medication Sig Start Date End Date Taking? Authorizing Provider  atenolol (TENORMIN) 25 MG tablet TAKE 1 TABLET BY MOUTH DAILY . TAKE WITH 50 MG TABLET FOR A TOTAL OF 75 MG Patient taking differently: TAKE 25 MG BY MOUTH DAILY . TAKE WITH 50 MG TABLET FOR A TOTAL OF 75 MG 07/15/15   Newt Lukes, MD  atenolol (TENORMIN) 50 MG tablet TAKE 1 TABLET BY MOUTH DAILY . TAKE WITH 25 MG TABLET FOR A TOTAL OF 75 MG. Patient taking differently: TAKE 50 MG BY MOUTH DAILY . TAKE WITH 25 MG TABLET FOR A TOTAL OF 75 MG. 07/15/15   Newt Lukes, MD  clopidogrel (PLAVIX) 75 MG tablet Take 1 tablet (75 mg total) by mouth daily. 01/26/16   Pincus Sanes, MD  pantoprazole (PROTONIX) 40 MG tablet Take 1 tablet (40 mg total) by mouth daily at 6 (six) AM. 06/19/16   Kathlen Mody, MD  pravastatin (PRAVACHOL) 20 MG tablet TAKE 1 TABLET (20 MG TOTAL) BY MOUTH DAILY. 03/29/16   Pincus SanesStacy J Burns, MD    Family History Family History  Problem Relation Age of Onset  . Heart attack Father   . Cancer Mother   . Heart disease Other     Parent  . Miscarriages / Stillbirths Other   . Hyperlipidemia Brother   . Hypertension Brother   . Stroke Brother     Social History Social History  Substance Use Topics  . Smoking status: Former Smoker    Packs/day: 1.00    Years: 61.00    Quit date: 09/29/2009  . Smokeless tobacco: Never Used  . Alcohol use 0.0 oz/week      Comment: Occasional     Allergies   Codeine; Penicillins; and Sulfonamide derivatives   Review of Systems Review of Systems  Unable to perform ROS: Acuity of condition  Cardiovascular: Positive for chest pain.     Physical Exam Updated Vital Signs BP 108/74 (BP Location: Left Arm)   Pulse 114   Resp 26   SpO2 98%   Physical Exam  Constitutional:  Acutely and chronically ill-appearing  HENT:  Head: Normocephalic and atraumatic.  Eyes: Conjunctivae and EOM are normal.  Conjunctiva pale  Neck: Neck supple. No tracheal deviation present. No thyromegaly present.  Cardiovascular: Normal rate and regular rhythm.   No murmur heard. Pulmonary/Chest: Effort normal and breath sounds normal.  Abdominal: Soft. Bowel sounds are normal. She exhibits no distension. There is no tenderness.  Musculoskeletal: Normal range of motion. She exhibits no edema or tenderness.  Neurological: She is alert. Coordination normal.  Skin: Skin is warm and dry. No rash noted.  Psychiatric: She has a normal mood and affect.  Nursing note and vitals reviewed.    ED Treatments / Results  Labs (all labs ordered are listed, but only abnormal results are displayed) Labs Reviewed  BASIC METABOLIC PANEL  CBC  I-STAT TROPOININ, ED  I-STAT TROPOININ, ED    EKG  EKG Interpretation  Date/Time:  Monday June 20 2016 22:30:10 EST Ventricular Rate:  113 PR Interval:    QRS Duration: 75 QT Interval:  319 QTC Calculation: 438 R Axis:   -44 Text Interpretation:  Atrial fibrillation Ventricular bigeminy Inferior infarct, acute Extensive anterior infarct, acute (LAD) New since previous tracing Confirmed by Gideon Burstein  MD, Tyrea Froberg [id number]) on 06/19/2016 10:34:48 PM       Radiology No results found.  Procedures Procedures (including critical care time)  Medications Ordered in ED Medications  iopamidol (ISOVUE-370) 76 % injection 100 mL (not administered)   Results for orders placed or performed  during the hospital encounter of 06/11/2016  Basic metabolic panel  Result Value Ref Range   Sodium 139 135 - 145 mmol/L   Potassium 3.8 3.5 - 5.1 mmol/L   Chloride 106 101 - 111 mmol/L   CO2 24 22 - 32 mmol/L   Glucose, Bld 154 (H) 65 - 99 mg/dL   BUN 24 (H) 6 - 20 mg/dL   Creatinine, Ser 6.040.86 0.44 - 1.00 mg/dL   Calcium 9.3 8.9 - 54.010.3 mg/dL   GFR calc non Af Amer 57 (L) >60 mL/min   GFR calc Af Amer >60 >60 mL/min   Anion gap 9 5 - 15  CBC  Result Value Ref Range   WBC 11.7 (H) 4.0 - 10.5 K/uL   RBC 4.68 3.87 - 5.11 MIL/uL   Hemoglobin 13.5 12.0 - 15.0 g/dL  HCT 41.8 36.0 - 46.0 %   MCV 89.3 78.0 - 100.0 fL   MCH 28.8 26.0 - 34.0 pg   MCHC 32.3 30.0 - 36.0 g/dL   RDW 16.1 (H) 09.6 - 04.5 %   Platelets 304 150 - 400 K/uL  I-stat troponin, ED  Result Value Ref Range   Troponin i, poc 1.19 (HH) 0.00 - 0.08 ng/mL   Comment 3           Dg Chest 2 View  Result Date: 06/16/2016 CLINICAL DATA:  Central chest pain, onset last night. EXAM: CHEST  2 VIEW COMPARISON:  Radiograph 11/25/2015.  Chest CT 11/30/2014 FINDINGS: Stable mediastinal contours with tortuous calcified thoracic aorta. No pulmonary edema or congestive failure. Minimal right basilar atelectasis or scarring. No focal airspace disease, pleural fluid or pneumothorax. Multiple compression deformities in the thoracic spine, suboptimally assessed due to background osteopenia. IMPRESSION: 1. No acute abnormality. 2. Tortuous atherosclerotic thoracic aorta, unchanged radiographic appearance. Electronically Signed   By: Rubye Oaks M.D.   On: 06/16/2016 15:46   Ct Chest W Contrast  Result Date: 06/17/2016 CLINICAL DATA:  Acute onset of central chest pain and back pain. Shortness of breath and nausea. Initial encounter. EXAM: CT CHEST WITH CONTRAST TECHNIQUE: Multidetector CT imaging of the chest was performed during intravenous contrast administration. CONTRAST:  75mL ISOVUE-300 IOPAMIDOL (ISOVUE-300) INJECTION 61%  COMPARISON:  Chest radiograph performed earlier today at 3:30 p.m., and CTA of the chest performed 11/30/2014 FINDINGS: Cardiovascular: Scattered calcification is noted along the thoracic aorta, with mild mural thrombus along the distal the aortic arch and descending thoracic aorta, and also along the right innominate artery. 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 The ascending thoracic aorta measures up to 4.1 cm in AP dimension. Given the patient's age, this remains within normal limits. Diffuse coronary artery calcifications are seen. Calcification is noted at the aortic valve. Mediastinum/Nodes: The esophagus is filled with fluid and air, raising question for mild esophageal dysmotility. No mediastinal lymphadenopathy is seen. No pericardial effusion is identified. The thyroid gland is unremarkable in appearance. No axillary lymphadenopathy is seen. Lungs/Pleura: Bilateral emphysematous change is noted. Mild scarring is noted at the right lung base, with associated chronic pleural thickening. No significant pleural effusion or pneumothorax is seen. No masses are identified. Upper Abdomen: The visualized portions of the liver and spleen are unremarkable. The visualized portions of the pancreas, left adrenal gland right kidney are within normal limits. Mild left renal atrophy is suggested, though this is difficult to fully assess due to motion artifact. A 2.2 cm right adrenal nodule is again noted. Musculoskeletal: No acute osseous abnormalities are identified. Chronic compression deformities are noted at T6, T7 and T10. The visualized musculature is unremarkable in appearance. IMPRESSION: 1. Esophagus largely filled with fluid and air, raising concern for mild esophageal dysmotility. 2. Mild mural thrombus along the distal aortic arch and descending thoracic aorta, and also along the right innominate artery. 3. 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 This is better characterized than in 2016. 4. Diffuse coronary artery calcification noted. Calcification at the aortic valve. 5. Bilateral emphysematous change. Mild scarring at the right lung base, with associated chronic pleural thickening. 6. Mild left renal atrophy suggested, not well characterized. 7. 2.2 cm right adrenal nodule is stable from 2016 and likely benign. 8. Chronic compression deformities at T6, T7 and T10. Electronically Signed   By: Leotis Shames  Chang M.D.   On: 06/17/2016 00:43    Initial Impression / Assessment and Plan / ED Course  I have reviewed the triage vital signs and the nursing notes.  Pertinent labs & imaging results that were available during my care of the patient were reviewed by me and considered in my medical decision making (see chart for details).  Clinical Course   Code STEMI called 10:45 PM. Dr. Sharmaine BaseEndconsulted. CT angiogram ordered to check for aortic dissection. Renal function was not waited for as situation felt to be emergent. I discussed CT angiogram with radiologist. No dissection. Dr.End, all cardiologist consulted and [facility]. Patient administered intravenous heparin, aspirin 324 mg by mouth. And morphine 2 mg IV. [facility] where [facility]'s arrival. Patient has fair 5 with mean that she is a DO NOT RESUSCITATE CODE STATUS. Her daughter is in agreement.    Final Clinical Impressions(s) / ED Diagnoses   Final diagnoses:  None  Dx #1 STEMI #2 hyperglycemia CRITICAL CARE Performed by: Doug SouJACUBOWITZ,Zakk Borgen Total critical care time: 35 minutes Critical care time was exclusive of separately billable procedures and treating other patients. Critical care was necessary to treat or prevent imminent or life-threatening deterioration. Critical care was time spent personally by me on the following  activities: development of treatment plan with patient and/or surrogate as well as nursing, discussions with consultants, evaluation of patient's response to treatment, examination of patient, obtaining history from patient or surrogate, ordering and performing treatments and interventions, ordering and review of laboratory studies, ordering and review of radiographic studies, pulse oximetry and re-evaluation of patient's condition. New Prescriptions New Prescriptions   No medications on file     Doug SouSam Horace Lukas, MD 06/11/2016 2329

## 2016-06-21 ENCOUNTER — Encounter (HOSPITAL_COMMUNITY): Payer: Self-pay | Admitting: *Deleted

## 2016-06-21 DIAGNOSIS — Z7189 Other specified counseling: Secondary | ICD-10-CM

## 2016-06-21 DIAGNOSIS — R011 Cardiac murmur, unspecified: Secondary | ICD-10-CM

## 2016-06-21 DIAGNOSIS — Z029 Encounter for administrative examinations, unspecified: Secondary | ICD-10-CM | POA: Diagnosis not present

## 2016-06-21 DIAGNOSIS — Z66 Do not resuscitate: Secondary | ICD-10-CM | POA: Diagnosis present

## 2016-06-21 DIAGNOSIS — E785 Hyperlipidemia, unspecified: Secondary | ICD-10-CM | POA: Diagnosis present

## 2016-06-21 DIAGNOSIS — R451 Restlessness and agitation: Secondary | ICD-10-CM | POA: Diagnosis not present

## 2016-06-21 DIAGNOSIS — M1990 Unspecified osteoarthritis, unspecified site: Secondary | ICD-10-CM | POA: Diagnosis present

## 2016-06-21 DIAGNOSIS — Z515 Encounter for palliative care: Secondary | ICD-10-CM

## 2016-06-21 DIAGNOSIS — Z79899 Other long term (current) drug therapy: Secondary | ICD-10-CM | POA: Diagnosis not present

## 2016-06-21 DIAGNOSIS — Z823 Family history of stroke: Secondary | ICD-10-CM | POA: Diagnosis not present

## 2016-06-21 DIAGNOSIS — K922 Gastrointestinal hemorrhage, unspecified: Secondary | ICD-10-CM | POA: Diagnosis not present

## 2016-06-21 DIAGNOSIS — I2510 Atherosclerotic heart disease of native coronary artery without angina pectoris: Secondary | ICD-10-CM | POA: Diagnosis present

## 2016-06-21 DIAGNOSIS — I10 Essential (primary) hypertension: Secondary | ICD-10-CM | POA: Diagnosis present

## 2016-06-21 DIAGNOSIS — Z87891 Personal history of nicotine dependence: Secondary | ICD-10-CM | POA: Diagnosis not present

## 2016-06-21 DIAGNOSIS — I739 Peripheral vascular disease, unspecified: Secondary | ICD-10-CM | POA: Diagnosis present

## 2016-06-21 DIAGNOSIS — M549 Dorsalgia, unspecified: Secondary | ICD-10-CM | POA: Diagnosis present

## 2016-06-21 DIAGNOSIS — M25559 Pain in unspecified hip: Secondary | ICD-10-CM | POA: Diagnosis present

## 2016-06-21 DIAGNOSIS — I213 ST elevation (STEMI) myocardial infarction of unspecified site: Secondary | ICD-10-CM | POA: Diagnosis present

## 2016-06-21 DIAGNOSIS — Z882 Allergy status to sulfonamides status: Secondary | ICD-10-CM | POA: Diagnosis not present

## 2016-06-21 DIAGNOSIS — I2102 ST elevation (STEMI) myocardial infarction involving left anterior descending coronary artery: Secondary | ICD-10-CM

## 2016-06-21 DIAGNOSIS — R570 Cardiogenic shock: Secondary | ICD-10-CM | POA: Diagnosis present

## 2016-06-21 DIAGNOSIS — Z885 Allergy status to narcotic agent status: Secondary | ICD-10-CM | POA: Diagnosis not present

## 2016-06-21 DIAGNOSIS — Z8673 Personal history of transient ischemic attack (TIA), and cerebral infarction without residual deficits: Secondary | ICD-10-CM | POA: Diagnosis not present

## 2016-06-21 DIAGNOSIS — K209 Esophagitis, unspecified: Secondary | ICD-10-CM | POA: Diagnosis present

## 2016-06-21 DIAGNOSIS — I2109 ST elevation (STEMI) myocardial infarction involving other coronary artery of anterior wall: Secondary | ICD-10-CM | POA: Diagnosis present

## 2016-06-21 DIAGNOSIS — Z8249 Family history of ischemic heart disease and other diseases of the circulatory system: Secondary | ICD-10-CM | POA: Diagnosis not present

## 2016-06-21 DIAGNOSIS — Z7902 Long term (current) use of antithrombotics/antiplatelets: Secondary | ICD-10-CM | POA: Diagnosis not present

## 2016-06-21 DIAGNOSIS — I959 Hypotension, unspecified: Secondary | ICD-10-CM | POA: Diagnosis not present

## 2016-06-21 LAB — HEPARIN LEVEL (UNFRACTIONATED)
HEPARIN UNFRACTIONATED: 0.55 [IU]/mL (ref 0.30–0.70)
Heparin Unfractionated: 0.86 IU/mL — ABNORMAL HIGH (ref 0.30–0.70)

## 2016-06-21 LAB — CBC
HEMATOCRIT: 38.5 % (ref 36.0–46.0)
HEMOGLOBIN: 12.4 g/dL (ref 12.0–15.0)
MCH: 28.7 pg (ref 26.0–34.0)
MCHC: 32.2 g/dL (ref 30.0–36.0)
MCV: 89.1 fL (ref 78.0–100.0)
Platelets: 273 10*3/uL (ref 150–400)
RBC: 4.32 MIL/uL (ref 3.87–5.11)
RDW: 16 % — ABNORMAL HIGH (ref 11.5–15.5)
WBC: 11.5 10*3/uL — ABNORMAL HIGH (ref 4.0–10.5)

## 2016-06-21 LAB — BASIC METABOLIC PANEL
ANION GAP: 11 (ref 5–15)
BUN: 23 mg/dL — ABNORMAL HIGH (ref 6–20)
CALCIUM: 8.7 mg/dL — AB (ref 8.9–10.3)
CO2: 17 mmol/L — AB (ref 22–32)
Chloride: 111 mmol/L (ref 101–111)
Creatinine, Ser: 1.1 mg/dL — ABNORMAL HIGH (ref 0.44–1.00)
GFR, EST AFRICAN AMERICAN: 49 mL/min — AB (ref >60)
GFR, EST NON AFRICAN AMERICAN: 42 mL/min — AB (ref >60)
Glucose, Bld: 250 mg/dL — ABNORMAL HIGH (ref 65–99)
POTASSIUM: 4.6 mmol/L (ref 3.5–5.1)
Sodium: 139 mmol/L (ref 135–145)

## 2016-06-21 LAB — APTT: aPTT: 33 seconds (ref 24–36)

## 2016-06-21 LAB — TROPONIN I
TROPONIN I: 28.44 ng/mL — AB (ref <0.03)
TROPONIN I: 36.84 ng/mL — AB (ref <0.03)
Troponin I: 6.65 ng/mL (ref <0.03)

## 2016-06-21 LAB — PROTIME-INR
INR: 1.06
PROTHROMBIN TIME: 13.8 s (ref 11.4–15.2)

## 2016-06-21 LAB — MRSA PCR SCREENING: MRSA by PCR: NEGATIVE

## 2016-06-21 MED ORDER — MORPHINE SULFATE (PF) 2 MG/ML IV SOLN
1.0000 mg | INTRAVENOUS | Status: DC | PRN
  Administered 2016-06-21 – 2016-06-22 (×7): 1 mg via INTRAVENOUS
  Filled 2016-06-21 (×7): qty 1

## 2016-06-21 MED ORDER — ASPIRIN EC 81 MG PO TBEC: 81.0000 mg | DELAYED_RELEASE_TABLET | Freq: Every day | ORAL | Status: DC

## 2016-06-21 MED ORDER — CLOPIDOGREL BISULFATE 300 MG PO TABS
300.0000 mg | ORAL_TABLET | Freq: Once | ORAL | Status: AC
  Administered 2016-06-21: 300 mg via ORAL
  Filled 2016-06-21: qty 1

## 2016-06-21 MED ORDER — ASPIRIN 300 MG RE SUPP
300.0000 mg | Freq: Once | RECTAL | Status: AC
  Administered 2016-06-21: 300 mg via RECTAL
  Filled 2016-06-21: qty 1

## 2016-06-21 MED ORDER — MORPHINE SULFATE (PF) 2 MG/ML IV SOLN
1.0000 mg | INTRAVENOUS | Status: DC | PRN
  Administered 2016-06-21 (×2): 1 mg via INTRAVENOUS
  Filled 2016-06-21 (×2): qty 1

## 2016-06-21 MED ORDER — METOPROLOL TARTRATE 12.5 MG HALF TABLET
12.5000 mg | ORAL_TABLET | Freq: Two times a day (BID) | ORAL | Status: DC
  Administered 2016-06-21 (×2): 12.5 mg via ORAL
  Filled 2016-06-21 (×3): qty 1

## 2016-06-21 MED ORDER — LORAZEPAM 2 MG/ML IJ SOLN
1.0000 mg | INTRAMUSCULAR | Status: DC | PRN
  Administered 2016-06-21 – 2016-06-22 (×2): 1 mg via INTRAVENOUS
  Filled 2016-06-21 (×2): qty 1

## 2016-06-21 MED ORDER — ACETAMINOPHEN 325 MG PO TABS: 650.0000 mg | ORAL_TABLET | ORAL | Status: DC | PRN

## 2016-06-21 MED ORDER — NITROGLYCERIN 0.4 MG SL SUBL: 0.4000 mg | SUBLINGUAL_TABLET | SUBLINGUAL | Status: DC | PRN

## 2016-06-21 MED ORDER — ATORVASTATIN CALCIUM 40 MG PO TABS
40.0000 mg | ORAL_TABLET | Freq: Every day | ORAL | Status: DC
  Filled 2016-06-21: qty 1

## 2016-06-21 MED ORDER — PANTOPRAZOLE SODIUM 40 MG PO TBEC
40.0000 mg | DELAYED_RELEASE_TABLET | Freq: Every day | ORAL | Status: DC
  Filled 2016-06-21 (×2): qty 1

## 2016-06-21 MED ORDER — ONDANSETRON HCL 4 MG/2ML IJ SOLN
4.0000 mg | Freq: Four times a day (QID) | INTRAMUSCULAR | Status: DC | PRN
  Administered 2016-06-21: 4 mg via INTRAVENOUS
  Filled 2016-06-21: qty 2

## 2016-06-21 MED ORDER — FENTANYL CITRATE (PF) 100 MCG/2ML IJ SOLN
50.0000 ug | Freq: Once | INTRAMUSCULAR | Status: AC
  Administered 2016-06-21: 50 ug via INTRAVENOUS
  Filled 2016-06-21: qty 2

## 2016-06-21 MED ORDER — CLOPIDOGREL BISULFATE 75 MG PO TABS
75.0000 mg | ORAL_TABLET | Freq: Every day | ORAL | Status: DC
  Administered 2016-06-21: 75 mg via ORAL
  Filled 2016-06-21: qty 1

## 2016-06-21 NOTE — Progress Notes (Signed)
ANTICOAGULATION CONSULT NOTE - Follow Up Consult  Pharmacy Consult for Heparin Indication: STEMI  Allergies  Allergen Reactions  . Codeine     Unknown  . Penicillins Other (See Comments)    Reaction: unknown  Has patient had a PCN reaction causing immediate rash, facial/tongue/throat swelling, SOB or lightheadedness with hypotension: n/a Has patient had a PCN reaction causing severe rash involving mucus membranes or skin necrosis: n/a Has patient had a PCN reaction that required hospitalization: n/a Has patient had a PCN reaction occurring within the last 10 years: n/a If all of the above answers are "NO", then may proceed with Cephalosporin use.   . Sulfonamide Derivatives     Pt states that it was so long ago that she does not remember what happened.     Patient Measurements: Height: 5\' 2"  (157.5 cm) Weight: 123 lb 7.3 oz (56 kg) IBW/kg (Calculated) : 50.1   Vital Signs: Temp: 98.7 F (37.1 C) (11/21 0330) Temp Source: Oral (11/21 0330) BP: 101/71 (11/21 0600) Pulse Rate: 110 (11/21 0149)  Labs:  Recent Labs  01/06/16 2240 06/21/16 0040 06/21/16 0217 06/21/16 0853  HGB 13.5  --   --  12.4  HCT 41.8  --   --  38.5  PLT 304  --   --  273  APTT  --  33  --   --   LABPROT  --  13.8  --   --   INR  --  1.06  --   --   HEPARINUNFRC  --   --   --  0.55  CREATININE 0.86  --   --  1.10*  TROPONINI  --   --  6.65* 28.44*    Estimated Creatinine Clearance: 25.8 mL/min (by C-G formula based on SCr of 1.1 mg/dL (H)).  Assessment: 82 yo F receiving conservative medical management for STEMI, troponin elevated at 28.44.   Heparin drip 650 units/hour. Heparin level therapeutic x1 at 0.55. CBC WNL, no bleeding noted.   Goal of Therapy:  Heparin level 0.3-0.7 units/ml Monitor platelets by anticoagulation protocol: Yes   Plan:  Continue heparin 650 units/hour. Recheck level at 1700 Daily HL and CBC Monitor for signs/symptoms of bleeding.   Oda CoganJoyce Darsha Zumstein, PharmD  Candidate 06/21/2016, 10:56 AM

## 2016-06-21 NOTE — Progress Notes (Signed)
CRITICAL VALUE ALERT  Critical value received:  Troponin 6.6  Date of notification 06/21/2016  Time of notification: 0620  Critical value read back:yes  Nurse who received alert:  Alinda MoneyMelvin  MD notified (1st page): 0630  Time of first page: 0635  MD notified (2nd page):  Time of second page:  Responding MD: Cards NP  Time MD responded:

## 2016-06-21 NOTE — Progress Notes (Signed)
EKG CRITICAL VALUE     12 lead EKG performed.  Critical value noted.  Alinda MoneyMelvin, RN notified.   Oda Coganiara S Woodard, CCT 06/21/2016 7:59 AM

## 2016-06-21 NOTE — Discharge Summary (Signed)
Physician Discharge Summary  Anayansi Rundquist ZOX:[id number] DOB: 1922/07/11 DOA: 06/16/2016  PCP: Pincus Sanes, MD  Admit date: 06/16/2016 Discharge date: 06/18/2016  Admitted From: home Disposition: Home.   Recommendations for Outpatient Follow-up:  1. Follow up with PCP in 1-2 weeks 2. Please obtain BMP/CBC in one week 3. Please follow up with gastroenterology as recommended.     Discharge Condition:STABLE.  CODE STATUS:dnr. Diet recommendation: Heart Healthy / Carb Modified /   Brief/Interim Summary: Collins Dimaria a 82 y.o.femalewith medical history significant of Esophageal food impaction in April of this year, concern for underlying esophageal dysmotility with tortuous esophagus per EGD note at that time. After having EGD and food impaction removed patient didn't have any further symptoms or trouble until yesterday . She repors similar complaints and came to ED for further evaluation. GI consulted and underwent EGD on 11/17. EGD s- Food in the upper third of the esophagus and in the middle third of the esophagus. Removal was   successful . Mildly severe acute esophagitis due to food  impaction. Tortuous esophagus suggestive of chronic dysmolity.  Normal stomach, Ulcerative duodenitis. Normal second portion of the duodenum. Post surgery she was started on pureed diet and discharged on the same to follow up with Dr Rhea Belton as recommended for barium swallow and biopsy results.    Discharge Diagnoses:  Principal Problem:   Esophageal dysmotility Active Problems:   Essential hypertension   Atypical chest pain   Esophageal obstruction due to food impaction    Discharge Instructions  Discharge Instructions    Discharge instructions    Complete by:  As directed    FOLLOW UP WITH  Dr Rhea Belton in one week to follow up the biopsy results and barium swallow.  Please continue to be on pureed diet and crush medications .       Medication List    STOP taking these medications    Calcium Citrate Petite/Vit D 200-250 MG-UNIT Tabs   risedronate 35 MG tablet Commonly known as:  ACTONEL     TAKE these medications   atenolol 25 MG tablet Commonly known as:  TENORMIN TAKE 1 TABLET BY MOUTH DAILY . TAKE WITH 50 MG TABLET FOR A TOTAL OF 75 MG What changed:  See the new instructions.   atenolol 50 MG tablet Commonly known as:  TENORMIN TAKE 1 TABLET BY MOUTH DAILY . TAKE WITH 25 MG TABLET FOR A TOTAL OF 75 MG. What changed:  See the new instructions.   clopidogrel 75 MG tablet Commonly known as:  PLAVIX Take 1 tablet (75 mg total) by mouth daily.   pantoprazole 40 MG tablet Commonly known as:  PROTONIX Take 1 tablet (40 mg total) by mouth daily at 6 (six) AM.   pravastatin 20 MG tablet Commonly known as:  PRAVACHOL TAKE 1 TABLET (20 MG TOTAL) BY MOUTH DAILY.      Follow-up Information    Pincus Sanes, MD. Schedule an appointment as soon as possible for a visit in 1 week(s).   Specialty:  Internal Medicine Contact information: [address] [place] [address] [phone]          Allergies  Allergen Reactions  . Codeine     Unknown  . Penicillins Other (See Comments)    Reaction: unknown  Has patient had a PCN reaction causing immediate rash, facial/tongue/throat swelling, SOB or lightheadedness with hypotension: n/a Has patient had a PCN reaction causing severe rash involving mucus membranes or skin necrosis: n/a Has patient  had a PCN reaction that required hospitalization: n/a Has patient had a PCN reaction occurring within the last 10 years: n/a If all of the above answers are "NO", then may proceed with Cephalosporin use.   . Sulfonamide Derivatives     Pt states that it was so long ago that she does not remember what happened.     Consultations:  GASTROENTEROLOG.    Procedures/Studies: Dg Chest 2 View  Result Date: 06/16/2016 CLINICAL DATA:  Central chest pain, onset last night. EXAM: CHEST  2 VIEW COMPARISON:  Radiograph  11/25/2015.  Chest CT 11/30/2014 FINDINGS: Stable mediastinal contours with tortuous calcified thoracic aorta. No pulmonary edema or congestive failure. Minimal right basilar atelectasis or scarring. No focal airspace disease, pleural fluid or pneumothorax. Multiple compression deformities in the thoracic spine, suboptimally assessed due to background osteopenia. IMPRESSION: 1. No acute abnormality. 2. Tortuous atherosclerotic thoracic aorta, unchanged radiographic appearance. Electronically Signed   By: Rubye Oaks M.D.   On: 06/16/2016 15:46   Ct Chest W Contrast  Result Date: 06/17/2016 CLINICAL DATA:  Acute onset of central chest pain and back pain. Shortness of breath and nausea. Initial encounter. EXAM: CT CHEST WITH CONTRAST TECHNIQUE: Multidetector CT imaging of the chest was performed during intravenous contrast administration. CONTRAST:  75mL ISOVUE-300 IOPAMIDOL (ISOVUE-300) INJECTION 61% COMPARISON:  Chest radiograph performed earlier today at 3:30 p.m., and CTA of the chest performed 11/30/2014 FINDINGS: Cardiovascular: Scattered calcification is noted along the thoracic aorta, with mild mural thrombus along the distal the aortic arch and descending thoracic aorta, and also along the right innominate artery. 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 The ascending thoracic aorta measures up to 4.1 cm in AP dimension. Given the patient's age, this remains within normal limits. Diffuse coronary artery calcifications are seen. Calcification is noted at the aortic valve. Mediastinum/Nodes: The esophagus is filled with fluid and air, raising question for mild esophageal dysmotility. No mediastinal lymphadenopathy is seen. No pericardial effusion is identified. The thyroid gland is unremarkable in appearance. No axillary lymphadenopathy is seen. Lungs/Pleura: Bilateral emphysematous change is noted. Mild scarring is noted at the right lung base,  with associated chronic pleural thickening. No significant pleural effusion or pneumothorax is seen. No masses are identified. Upper Abdomen: The visualized portions of the liver and spleen are unremarkable. The visualized portions of the pancreas, left adrenal gland right kidney are within normal limits. Mild left renal atrophy is suggested, though this is difficult to fully assess due to motion artifact. A 2.2 cm right adrenal nodule is again noted. Musculoskeletal: No acute osseous abnormalities are identified. Chronic compression deformities are noted at T6, T7 and T10. The visualized musculature is unremarkable in appearance. IMPRESSION: 1. Esophagus largely filled with fluid and air, raising concern for mild esophageal dysmotility. 2. Mild mural thrombus along the distal aortic arch and descending thoracic aorta, and also along the right innominate artery. 3. 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 This is better characterized than in 2016. 4. Diffuse coronary artery calcification noted. Calcification at the aortic valve. 5. Bilateral emphysematous change. Mild scarring at the right lung base, with associated chronic pleural thickening. 6. Mild left renal atrophy suggested, not well characterized. 7. 2.2 cm right adrenal nodule is stable from 2016 and likely benign. 8. Chronic compression deformities at T6, T7 and T10. Electronically Signed   By: Roanna Raider M.D.   On: 06/17/2016 00:43   Ct Angio  Chest/abd/pel For Dissection W And/or Wo Contrast  Result Date: 06/03/2016 CLINICAL DATA:  Chest and back pain EXAM: CT ANGIOGRAPHY CHEST, ABDOMEN AND PELVIS TECHNIQUE: Multidetector CT imaging through the chest, abdomen and pelvis was performed using the standard protocol during bolus administration of intravenous contrast. Multiplanar reconstructed images and MIPs were obtained and reviewed to evaluate the vascular anatomy. CONTRAST:  100 mL  Isovue 370 intravenous COMPARISON:  06/16/2016, 11/30/2014 FINDINGS: CTA CHEST FINDINGS Cardiovascular: There is ectasia of the ascending aorta, measuring up to 4 cm in maximum diameter. There is ectasia diffusely of the aortic arch with diffuse irregular calcification and mural thrombus. Arch measures 3.6 cm in maximum diameter. There is no dissection seen. There is no evidence for mediastinal hematoma. Bovine arch variant. There is occlusion of the origin and proximal left subclavian artery with reconstitution distally. Prominent central pulmonary arteries. No gross filling defects. Moderate mural thrombus is present within the descending thoracic aorta which is noted to be tortuous. There are coronary artery calcifications. Heart size is borderline enlarged. No significant pericardial effusion. Mediastinum/Nodes: Imaged thyroid gland grossly unremarkable. Few nonspecific sub cm mediastinal lymph nodes. No hilar adenopathy. No axillary adenopathy. Minimal air distention of the esophagus. Lungs/Pleura: Moderate emphysematous disease bilaterally. No acute consolidation or effusion. Mild atelectasis or scar in the right lower lobe. Musculoskeletal: Chronic compression deformities T6, T7 and T10 as before. Review of the MIP images confirms the above findings. CTA ABDOMEN AND PELVIS FINDINGS VASCULAR Aorta: Diffuse atherosclerotic vascular calcification with extensive mural thrombus present. No dissection is seen. Celiac: Atherosclerotic calcification at the origin of the celiac axis but no high-grade stenosis. Mild narrowing at the origin is suggested. SMA: Atherosclerotic calcifications are present within the SMA. Moderate severe focal stenosis of the proximal SMA , just distal to its origin. Renals: Atherosclerotic calcification at the origin of the right renal artery with scattered calcifications throughout the course of the vessel. No high-grade stenosis. Mild stenosis of the proximal left renal artery with  scattered atherosclerotic calcification IMA: Visualized and grossly patent.  Calcified plaque at the origin. Inflow: Diffuse calcification and mural thrombus within the distal abdominal aorta. Moderate diffuse disease of the common iliac artery's with at least moderate focal stenosis of the proximal right common iliac artery. Moderate severe disease of right internal iliac artery. Moderate diffuse disease of the right external iliac artery with moderate severe irregular stenosis of the right common femoral artery. Moderate diffuse disease of the left common iliac artery. Moderate severe diffuse disease of left internal iliac artery. Mild to moderate diffuse disease of the left external iliac artery. Left common femoral artery shows mild focal narrowing but is grossly patent. Veins: Suboptimal evaluation due to arterial phase exam. Review of the MIP images confirms the above findings. NON-VASCULAR Hepatobiliary: No focal liver abnormality is seen. No gallstones, gallbladder wall thickening, or biliary dilatation. Pancreas: Unremarkable. No pancreatic ductal dilatation or surrounding inflammatory changes. Spleen: Irregular hypodensity in the spleen likely related to phase of enhancement. Adrenals/Urinary Tract: 2.2 cm right adrenal gland adenoma unchanged. Left adrenal gland normal. Kidneys are slightly atrophic. No hydronephrosis. The bladder is unremarkable. Stomach/Bowel: There is no dilated large or small bowel. The stomach is unremarkable. The appendix is visualized and is normal. Sigmoid colon diverticular disease without bowel wall thickening. Lymphatic: No significantly enlarged abdominal or pelvic lymph nodes. Reproductive: Status post hysterectomy. No adnexal masses. Other: No free air or free fluid. Musculoskeletal: Severe multilevel degenerative changes of the lumbar spine. Grade 1 anterior listhesis of L3  on L4. Multilevel vacuum disc phenomenon. Review of the MIP images confirms the above findings.  IMPRESSION: 1. No evidence for acute aortic dissection or mediastinal hematoma. 2. Extensive atherosclerotic vascular calcification and mural thrombus. Bovine arch variant with occluded proximal left subclavian artery which reconstitutes distally. Moderate severe focal stenosis of the SMA just distal to its origin. 3. 2.2 cm right adrenal gland adenoma unchanged. Electronically Signed   By: Jasmine PangKim  Fujinaga M.D.   On: 04-15-2016 23:37    EGD.   Subjective: Wants to go home, no new complaints.   Discharge Exam: Vitals:   06/18/16 1000 06/18/16 1432  BP: 110/65 110/73  Pulse: 83 82  Resp:  18  Temp:  98 F (36.7 C)   Vitals:   06/17/16 2049 06/18/16 0445 06/18/16 1000 06/18/16 1432  BP: (!) 114/55 116/62 110/65 110/73  Pulse: 78 82 83 82  Resp: 19 17  18   Temp: 97.5 F (36.4 C) 97.6 F (36.4 C)  98 F (36.7 C)  TempSrc: Oral Oral  Oral  SpO2: 97% 99%  97%  Weight:      Height:        General: Pt is alert, awake, not in acute distress Cardiovascular: RRR, S1/S2 +,  Respiratory: CTA bilaterally, no wheezing, no rhonchi Abdominal: Soft, NT, ND, bowel sounds + Extremities: no edema, no cyanosis    The results of significant diagnostics from this hospitalization (including imaging, microbiology, ancillary and laboratory) are listed below for reference.     Microbiology: Recent Results (from the past 240 hour(s))  Urine culture     Status: Abnormal   Collection Time: 06/16/16  7:24 PM  Result Value Ref Range Status   Specimen Description URINE, CLEAN CATCH  Final   Special Requests NONE  Final   Culture 40,000 COLONIES/mL PROTEUS MIRABILIS (A)  Final   Report Status 06/19/2016 FINAL  Final   Organism ID, Bacteria PROTEUS MIRABILIS (A)  Final      Susceptibility   Proteus mirabilis - MIC*    AMPICILLIN <=2 SENSITIVE Sensitive     CEFAZOLIN <=4 SENSITIVE Sensitive     CEFTRIAXONE <=1 SENSITIVE Sensitive     CIPROFLOXACIN <=0.25 SENSITIVE Sensitive     GENTAMICIN <=1  SENSITIVE Sensitive     IMIPENEM 4 SENSITIVE Sensitive     NITROFURANTOIN 128 RESISTANT Resistant     TRIMETH/SULFA <=20 SENSITIVE Sensitive     AMPICILLIN/SULBACTAM <=2 SENSITIVE Sensitive     PIP/TAZO <=4 SENSITIVE Sensitive     * 40,000 COLONIES/mL PROTEUS MIRABILIS  MRSA PCR Screening     Status: None   Collection Time: 06/21/16  2:30 AM  Result Value Ref Range Status   MRSA by PCR NEGATIVE NEGATIVE Final    Comment:        The GeneXpert MRSA Assay (FDA approved for NASAL specimens only), is one component of a comprehensive MRSA colonization surveillance program. It is not intended to diagnose MRSA infection nor to guide or monitor treatment for MRSA infections.      Labs: BNP (last 3 results)  Recent Labs  06/16/16 1539  BNP 103.8*   Basic Metabolic Panel:  Recent Labs Lab 06/16/16 1539 Sep 16, 2015 2240  NA 140 139  K 3.8 3.8  CL 105 106  CO2 25 24  GLUCOSE 109* 154*  BUN 28* 24*  CREATININE 0.89 0.86  CALCIUM 9.7 9.3   Liver Function Tests: No results for input(s): AST, ALT, ALKPHOS, BILITOT, PROT, ALBUMIN in the last 168 hours. No results  for input(s): LIPASE, AMYLASE in the last 168 hours. No results for input(s): AMMONIA in the last 168 hours. CBC:  Recent Labs Lab 06/16/16 1539 June 21, 2016 2240 06/21/16 0853  WBC 8.6 11.7* 11.5*  HGB 14.7 13.5 12.4  HCT 45.5 41.8 38.5  MCV 89.2 89.3 89.1  PLT 316 304 273   Cardiac Enzymes:  Recent Labs Lab 06/21/16 0217  TROPONINI 6.65*   BNP: Invalid input(s): POCBNP CBG: No results for input(s): GLUCAP in the last 168 hours. D-Dimer No results for input(s): DDIMER in the last 72 hours. Hgb A1c No results for input(s): HGBA1C in the last 72 hours. Lipid Profile No results for input(s): CHOL, HDL, LDLCALC, TRIG, CHOLHDL, LDLDIRECT in the last 72 hours. Thyroid function studies No results for input(s): TSH, T4TOTAL, T3FREE, THYROIDAB in the last 72 hours.  Invalid input(s): FREET3 Anemia work  up No results for input(s): VITAMINB12, FOLATE, FERRITIN, TIBC, IRON, RETICCTPCT in the last 72 hours. Urinalysis    Component Value Date/Time   COLORURINE AMBER (A) 06/16/2016 1924   APPEARANCEUR CLOUDY (A) 06/16/2016 1924   LABSPEC 1.024 06/16/2016 1924   PHURINE 5.5 06/16/2016 1924   GLUCOSEU NEGATIVE 06/16/2016 1924   HGBUR TRACE (A) 06/16/2016 1924   BILIRUBINUR SMALL (A) 06/16/2016 1924   KETONESUR NEGATIVE 06/16/2016 1924   PROTEINUR NEGATIVE 06/16/2016 1924   UROBILINOGEN 0.2 11/30/2014 1744   NITRITE NEGATIVE 06/16/2016 1924   LEUKOCYTESUR MODERATE (A) 06/16/2016 1924   Sepsis Labs Invalid input(s): PROCALCITONIN,  WBC,  LACTICIDVEN Microbiology Recent Results (from the past 240 hour(s))  Urine culture     Status: Abnormal   Collection Time: 06/16/16  7:24 PM  Result Value Ref Range Status   Specimen Description URINE, CLEAN CATCH  Final   Special Requests NONE  Final   Culture 40,000 COLONIES/mL PROTEUS MIRABILIS (A)  Final   Report Status 06/19/2016 FINAL  Final   Organism ID, Bacteria PROTEUS MIRABILIS (A)  Final      Susceptibility   Proteus mirabilis - MIC*    AMPICILLIN <=2 SENSITIVE Sensitive     CEFAZOLIN <=4 SENSITIVE Sensitive     CEFTRIAXONE <=1 SENSITIVE Sensitive     CIPROFLOXACIN <=0.25 SENSITIVE Sensitive     GENTAMICIN <=1 SENSITIVE Sensitive     IMIPENEM 4 SENSITIVE Sensitive     NITROFURANTOIN 128 RESISTANT Resistant     TRIMETH/SULFA <=20 SENSITIVE Sensitive     AMPICILLIN/SULBACTAM <=2 SENSITIVE Sensitive     PIP/TAZO <=4 SENSITIVE Sensitive     * 40,000 COLONIES/mL PROTEUS MIRABILIS  MRSA PCR Screening     Status: None   Collection Time: 06/21/16  2:30 AM  Result Value Ref Range Status   MRSA by PCR NEGATIVE NEGATIVE Final    Comment:        The GeneXpert MRSA Assay (FDA approved for NASAL specimens only), is one component of a comprehensive MRSA colonization surveillance program. It is not intended to diagnose MRSA infection nor to  guide or monitor treatment for MRSA infections.      Time coordinating discharge: Over 30 minutes  SIGNED:   Kathlen Mody, MD  [facility] 06/21/2016, 10:19 AM Pager   If 7PM-7AM, please contact night-coverage [email or web address] Password [email or web address]

## 2016-06-21 NOTE — Progress Notes (Addendum)
   Called to see patient because of low blood pressure and ongoing chest pain.  Patient is already declared as a DO NOT RESUSCITATE. She refuses acute intervention earlier this morning.  I reaffirmed with the patient and daughter an option of treatment center include active invasive management versus conservative medical management with palliative care/hospice option. The patient endorses a conservative management approach.  Blood pressure initially 80/60 mmHg, heart rate 110 bpm. Lying relatively flat there is a  very loud 3 to 4/6 holosystolic murmur compatible with either VSD or mitral regurgitation. Neck veins are moderately dilated. Abdomen is soft. No edema.   Initial EKG at 10:30 PM demonstrated hyperacute anterior T waves with significant ST elevation. Most recent EKG now reveals persistent precordial ST elevation and Q waves in inferior leads II, III, aVF, and V1 through V4.   Overall impression is that the patient is having a massive anterior/inferior acute infarction now 18-24 hours old. She appears to be developing cardiogenic shock. This could be related to widespread muscle damage versus mechanical complication. She has chosen conservative medical management. The systolic murmur is compatible with possible severe acute mitral regurgitation versus VSD.Marland Kitchen.   Recommend hospice and palliative care consultation. Opiate analgesia for pain control. Echocardiogram to assess murmur. This would further understanding of her potential prognosis. This information will be helpful to hospice and palliative care.    Critical care time spent 35 minutes in this patient with impending cardiogenic shock and probable mechanical complication of myocardial infarction. Greater than 50% of the time was spent with the patient and family discussing treatment options.

## 2016-06-21 NOTE — ED Notes (Signed)
Cards at bedside, pt has refused cardiac cath, will admit to ICU. ASA suppository given b/c [facility]. Pt family member states pt has trouble swallowing oral meds

## 2016-06-21 NOTE — H&P (Signed)
Cardiology History & Physical    Patient ID: Margaret StadeRuth Brooks MRN: [id number], DOB: 10/31/1921 Date of Encounter: 06/21/2016, 12:05 AM Primary Physician: Pincus SanesStacy J Burns, MD Primary Cardiologist: None  Chief Complaint: Back and chest pain Reason for Admission: STEMI Requesting MD: Doug SouSam Jacubowitz, MD  HPI: [name] is a 82 y/o woman with history of coronary artery disease, stroke, extensive peripheral vascular disease, hypertension, hyperlipidemia, and chronic back and hip pain, Who [facility] with back and chest pain. She reports similar pain over the last several weeks, including during a [facility]. At that time, CT scan was concerning for food impaction in the esophagus, and the patient underwent EGD demonstrated retained food in the esophagus with accompanying esophagitis and non-bleeding ulcers. The patient returned today because of continued pain in her back as well as more severe chest pain. EKG in the emergency department was notable for anterior ST elevation with Q waves. Due to her accompanying back pain and extensive peripheral vascular disease, aortic dissection CT was performed, demonstrating diffuse atherosclerosis without evidence of aortic dissection. The patient was therefore referred to [facility] for possible emergent left heart catheterization.  At this time, the patient continues to complain of severe left-sided chest pain as well as pain in her back. She also notes the sensation of needing to belch. She denies shortness of breath. She complains of back and hip pain as well, stating it is difficult for her to lie on her back. She refused aspirin at [facility] due to difficulty swallowing. She has otherwise been trying to take her medications, including clopidogrel.  Past Medical History:  Diagnosis Date  . ARTHRITIS   . CEREBRAL EMBOLISM, WITH INFARCTION 07/2010   mild R HP  . CORONARY ARTERY DISEASE   . Cyst of  breast 2007   cyst on breast  . HYPERLIPIDEMIA   . HYPERTENSION   . MACULAR DEGENERATION   . Osteoporosis, post-menopausal 07/2012   T10 compression fx, R hip fx 04/02/14 after fall  . PERIPHERAL VASCULAR DISEASE   . Sebaceous cyst    L chin and R neck  . Subclavian steal syndrome      Surgical History:  Past Surgical History:  Procedure Laterality Date  . BREAST SURGERY  2011   sebacious cyst removal   . ESOPHAGOGASTRODUODENOSCOPY N/A 11/25/2015   Procedure: ESOPHAGOGASTRODUODENOSCOPY (EGD);  Surgeon: Bernette Redbirdobert Buccini, MD;  [facility];  Service: Endoscopy;  Laterality: N/A;  . ESOPHAGOGASTRODUODENOSCOPY (EGD) WITH PROPOFOL N/A 06/17/2016   Procedure: ESOPHAGOGASTRODUODENOSCOPY (EGD) WITH PROPOFOL;  Surgeon: Beverley FiedlerJay M Pyrtle, MD;  Location: [facility] ENDOSCOPY;  Service: Endoscopy;  Laterality: N/A;  . HIP PINNING Right 04/2014  . Sabacious cyst removed from breast  2011  . TONSILLECTOMY       Home Meds: Prior to Admission medications   Medication Sig Start Date Arnett Galindez Date Taking? Authorizing Provider  atenolol (TENORMIN) 25 MG tablet TAKE 1 TABLET BY MOUTH DAILY . TAKE WITH 50 MG TABLET FOR A TOTAL OF 75 MG Patient taking differently: TAKE 25 MG BY MOUTH DAILY . TAKE WITH 50 MG TABLET FOR A TOTAL OF 75 MG 07/15/15   Newt LukesValerie A Leschber, MD  atenolol (TENORMIN) 50 MG tablet TAKE 1 TABLET BY MOUTH DAILY . TAKE WITH 25 MG TABLET FOR A TOTAL OF 75 MG. Patient taking differently: TAKE 50 MG BY MOUTH DAILY . TAKE WITH 25 MG TABLET FOR A TOTAL OF 75 MG. 07/15/15  Newt Lukes, MD  clopidogrel (PLAVIX) 75 MG tablet Take 1 tablet (75 mg total) by mouth daily. 01/26/16   Pincus Sanes, MD  pantoprazole (PROTONIX) 40 MG tablet Take 1 tablet (40 mg total) by mouth daily at 6 (six) AM. 06/19/16   Kathlen Mody, MD  pravastatin (PRAVACHOL) 20 MG tablet TAKE 1 TABLET (20 MG TOTAL) BY MOUTH DAILY. 03/29/16   Pincus Sanes, MD    Allergies:  Allergies  Allergen Reactions  . Codeine     Unknown   . Penicillins Other (See Comments)    Reaction: unknown  Has patient had a PCN reaction causing immediate rash, facial/tongue/throat swelling, SOB or lightheadedness with hypotension: n/a Has patient had a PCN reaction causing severe rash involving mucus membranes or skin necrosis: n/a Has patient had a PCN reaction that required hospitalization: n/a Has patient had a PCN reaction occurring within the last 10 years: n/a If all of the above answers are "NO", then may proceed with Cephalosporin use.   . Sulfonamide Derivatives     Pt states that it was so long ago that she does not remember what happened.     Social History   Social History  . Marital status: Widowed    Spouse name: N/A  . Number of children: N/A  . Years of education: N/A   Occupational History  . 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 Smoking status: Former Smoker    Packs/day: 1.00    Years: 61.00    Quit date: 09/29/2009  . Smokeless tobacco: Never Used  . Alcohol use 0.0 oz/week     Comment: Occasional  . Drug use: No  . Sexual activity: No   Other Topics Concern  . Not on file   Social History Narrative   Lives in Independent Living Facility. Moved from [place] in the fall of 2011 to be near daughter     Family History  Problem Relation Age of Onset  . Heart attack Father   . Cancer Mother   . Heart disease Other     Parent  . Miscarriages / Stillbirths Other   . Hyperlipidemia Brother   . Hypertension Brother   . Stroke Brother     Review of Systems: A 12-system review of systems was performed and is negative except as noted in the HPI.  Labs:  Lab Results  Component Value Date   WBC 11.7 (H) Jul 10, 2016   HGB 13.5 07/10/16   HCT 41.8 Jul 10, 2016   MCV 89.3 Jul 10, 2016   PLT 304 Jul 10, 2016    Recent Labs Lab July 10, 2016 2240  NA 139  K 3.8  CL 106  CO2 24  BUN 24*  CREATININE 0.86  CALCIUM 9.3  GLUCOSE 154*   Lab Results  Component Value Date    CHOL 182 02/05/2016   HDL 56.70 02/05/2016   LDLCALC 105 (H) 02/05/2016   TRIG 100.0 02/05/2016   Lab Results  Component Value Date   DDIMER 3.50 (H) 11/30/2014   POC TnI 1.19  Radiology/Studies:  Dg Chest 2 View  Result Date: 06/16/2016 CLINICAL DATA:  Central chest pain, onset last night. EXAM: CHEST  2 VIEW COMPARISON:  Radiograph 11/25/2015.  Chest CT 11/30/2014 FINDINGS: Stable mediastinal contours with tortuous calcified thoracic aorta. No pulmonary edema or congestive failure. Minimal right basilar atelectasis or scarring. No focal airspace disease, pleural fluid or pneumothorax. Multiple compression deformities in the thoracic spine, suboptimally assessed due to background osteopenia. IMPRESSION:  1. No acute abnormality. 2. Tortuous atherosclerotic thoracic aorta, unchanged radiographic appearance. Electronically Signed   By: Rubye Oaks M.D.   On: 06/16/2016 15:46   Ct Chest W Contrast  Result Date: 06/17/2016 CLINICAL DATA:  Acute onset of central chest pain and back pain. Shortness of breath and nausea. Initial encounter. EXAM: CT CHEST WITH CONTRAST TECHNIQUE: Multidetector CT imaging of the chest was performed during intravenous contrast administration. CONTRAST:  75mL ISOVUE-300 IOPAMIDOL (ISOVUE-300) INJECTION 61% COMPARISON:  Chest radiograph performed earlier today at 3:30 p.m., and CTA of the chest performed 11/30/2014 FINDINGS: Cardiovascular: Scattered calcification is noted along the thoracic aorta, with mild mural thrombus along the distal the aortic arch and descending thoracic aorta, and also along the right innominate artery. 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 The ascending thoracic aorta measures up to 4.1 cm in AP dimension. Given the patient's age, this remains within normal limits. Diffuse coronary artery calcifications are seen. Calcification is noted at the aortic valve. Mediastinum/Nodes: The  esophagus is filled with fluid and air, raising question for mild esophageal dysmotility. No mediastinal lymphadenopathy is seen. No pericardial effusion is identified. The thyroid gland is unremarkable in appearance. No axillary lymphadenopathy is seen. Lungs/Pleura: Bilateral emphysematous change is noted. Mild scarring is noted at the right lung base, with associated chronic pleural thickening. No significant pleural effusion or pneumothorax is seen. No masses are identified. Upper Abdomen: The visualized portions of the liver and spleen are unremarkable. The visualized portions of the pancreas, left adrenal gland right kidney are within normal limits. Mild left renal atrophy is suggested, though this is difficult to fully assess due to motion artifact. A 2.2 cm right adrenal nodule is again noted. Musculoskeletal: No acute osseous abnormalities are identified. Chronic compression deformities are noted at T6, T7 and T10. The visualized musculature is unremarkable in appearance. IMPRESSION: 1. Esophagus largely filled with fluid and air, raising concern for mild esophageal dysmotility. 2. Mild mural thrombus along the distal aortic arch and descending thoracic aorta, and also along the right innominate artery. 3. 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 This is better characterized than in 2016. 4. Diffuse coronary artery calcification noted. Calcification at the aortic valve. 5. Bilateral emphysematous change. Mild scarring at the right lung base, with associated chronic pleural thickening. 6. Mild left renal atrophy suggested, not well characterized. 7. 2.2 cm right adrenal nodule is stable from 2016 and likely benign. 8. Chronic compression deformities at T6, T7 and T10. Electronically Signed   By: Roanna Raider M.D.   On: 06/17/2016 00:43   Ct Angio Chest/abd/pel For Dissection W And/or Wo Contrast  Result Date: Jul 07, 2016 CLINICAL DATA:   Chest and back pain EXAM: CT ANGIOGRAPHY CHEST, ABDOMEN AND PELVIS TECHNIQUE: Multidetector CT imaging through the chest, abdomen and pelvis was performed using the standard protocol during bolus administration of intravenous contrast. Multiplanar reconstructed images and MIPs were obtained and reviewed to evaluate the vascular anatomy. CONTRAST:  100 mL Isovue 370 intravenous COMPARISON:  06/16/2016, 11/30/2014 FINDINGS: CTA CHEST FINDINGS Cardiovascular: There is ectasia of the ascending aorta, measuring up to 4 cm in maximum diameter. There is ectasia diffusely of the aortic arch with diffuse irregular calcification and mural thrombus. Arch measures 3.6 cm in maximum diameter. There is no dissection seen. There is no evidence for mediastinal hematoma. Bovine arch variant. There is occlusion of the origin and proximal left subclavian artery with reconstitution distally.  Prominent central pulmonary arteries. No gross filling defects. Moderate mural thrombus is present within the descending thoracic aorta which is noted to be tortuous. There are coronary artery calcifications. Heart size is borderline enlarged. No significant pericardial effusion. Mediastinum/Nodes: Imaged thyroid gland grossly unremarkable. Few nonspecific sub cm mediastinal lymph nodes. No hilar adenopathy. No axillary adenopathy. Minimal air distention of the esophagus. Lungs/Pleura: Moderate emphysematous disease bilaterally. No acute consolidation or effusion. Mild atelectasis or scar in the right lower lobe. Musculoskeletal: Chronic compression deformities T6, T7 and T10 as before. Review of the MIP images confirms the above findings. CTA ABDOMEN AND PELVIS FINDINGS VASCULAR Aorta: Diffuse atherosclerotic vascular calcification with extensive mural thrombus present. No dissection is seen. Celiac: Atherosclerotic calcification at the origin of the celiac axis but no high-grade stenosis. Mild narrowing at the origin is suggested. SMA:  Atherosclerotic calcifications are present within the SMA. Moderate severe focal stenosis of the proximal SMA , just distal to its origin. Renals: Atherosclerotic calcification at the origin of the right renal artery with scattered calcifications throughout the course of the vessel. No high-grade stenosis. Mild stenosis of the proximal left renal artery with scattered atherosclerotic calcification IMA: Visualized and grossly patent.  Calcified plaque at the origin. Inflow: Diffuse calcification and mural thrombus within the distal abdominal aorta. Moderate diffuse disease of the common iliac artery's with at least moderate focal stenosis of the proximal right common iliac artery. Moderate severe disease of right internal iliac artery. Moderate diffuse disease of the right external iliac artery with moderate severe irregular stenosis of the right common femoral artery. Moderate diffuse disease of the left common iliac artery. Moderate severe diffuse disease of left internal iliac artery. Mild to moderate diffuse disease of the left external iliac artery. Left common femoral artery shows mild focal narrowing but is grossly patent. Veins: Suboptimal evaluation due to arterial phase exam. Review of the MIP images confirms the above findings. NON-VASCULAR Hepatobiliary: No focal liver abnormality is seen. No gallstones, gallbladder wall thickening, or biliary dilatation. Pancreas: Unremarkable. No pancreatic ductal dilatation or surrounding inflammatory changes. Spleen: Irregular hypodensity in the spleen likely related to phase of enhancement. Adrenals/Urinary Tract: 2.2 cm right adrenal gland adenoma unchanged. Left adrenal gland normal. Kidneys are slightly atrophic. No hydronephrosis. The bladder is unremarkable. Stomach/Bowel: There is no dilated large or small bowel. The stomach is unremarkable. The appendix is visualized and is normal. Sigmoid colon diverticular disease without bowel wall thickening. Lymphatic: No  significantly enlarged abdominal or pelvic lymph nodes. Reproductive: Status post hysterectomy. No adnexal masses. Other: No free air or free fluid. Musculoskeletal: Severe multilevel degenerative changes of the lumbar spine. Grade 1 anterior listhesis of L3 on L4. Multilevel vacuum disc phenomenon. Review of the MIP images confirms the above findings. IMPRESSION: 1. No evidence for acute aortic dissection or mediastinal hematoma. 2. Extensive atherosclerotic vascular calcification and mural thrombus. Bovine arch variant with occluded proximal left subclavian artery which reconstitutes distally. Moderate severe focal stenosis of the SMA just distal to its origin. 3. 2.2 cm right adrenal gland adenoma unchanged. Electronically Signed   By: Jasmine Pang M.D.   On: 2016-07-07 23:37   Wt Readings from Last 3 Encounters:  06/17/16 53.5 kg (118 lb)  02/19/16 54.4 kg (120 lb)  02/05/16 54 kg (119 lb)   EKG: Sinus tachycardia with frequent PVCs, marked anterolateral ST segment elevation and accompanying Q waves.  Physical Exam: Blood pressure 108/74, pulse 114, resp. rate 26, SpO2 98 %. There is no height or weight  on file to calculate BMI. General: Frail, elderly woman lying in bed. She occasionally writhes in pain.  She appears pale. Head: Normocephalic, atraumatic, sclera non-icteric, no xanthomas, nares are without discharge.  Neck: Supple JVD not elevated. Lungs: Clear bilaterally to auscultation without wheezes, rales, or rhonchi. Breathing is unlabored. Heart: Tachycardic but regular with 3/6 crescendo-decrescendo murmur. S2 is indistinct. No rubs or gallops. Abdomen: Soft, non-tender, non-distended with normoactive bowel sounds. No hepatomegaly. No rebound/guarding. No obvious abdominal masses. Msk:  Unable to assess due to patient's pain and lack of cooperation. Extremities: No edema.  2+ right radial and left femoral pulses. Left radial and right femoral pulses are not palpable. Neuro: Alert  and oriented X 3. No focal deficit. No facial asymmetry. Moves all extremities spontaneously. Psych:  Responds to questions appropriately but at times is agitated due to pain.     Assessment and Plan  82 year old woman with multiple medical problems including coronary artery disease, stroke, extensive peripheral arterial disease, hypertension, hyperlipidemia, and recurrent back and chest pain due to esophageal dysmotility and food impaction, presenting with chest and back pain in the setting of anterior ST segment elevation and Q waves.  Anterior STEMI: Patient's presentation is concerning for anterior STEMI due to LAD occlusion. The patient, her daughter, and I discussed treatment options, including cardiac catheterization and medical therapy. The patient states that under no circumstances would she want to be resuscitated or have "heroic procedures" performed. Her only desire at this time is for her pain to be controlled. I informed her that cardiovascular outcomes (including mortality) are improved with primary PCI over medical therapy, though given her comorbidities, cardiac catheterization his higher risk than in most patients. She stated that she would prefer medical therapy over cardiac catheterization at this time. Her daughter is in agreement with this. We will therefore not proceed with emergent left heart catheterization. In accordance with the patient's aforementioned wishes and multiple medical comorbidities, I do not feel that thrombolytics are indicated either, as the risk for significant hemorrhage outweigh the benefits.  Admit to cardiac ICU  Initiate heparin infusion per ACS nomogram  Administer rectal aspirin 300 mg, as patient refuses oral aspirin  Loaded with clopidogrel 300 mg 1, followed by 75 mg daily once patient is able to take medications by mouth  Administer morphine IV as blood pressure tolerates for pain control  Would avoid nitrates at this time given concern for  severe aortic stenosis based on murmur on exam  Obtain transthoracic echocardiogram in the morning  Continue statin therapy; consider switching to moderate to high intensity statin  Systolic heart murmur: This most likely reflects significant aortic stenosis. The patient reports that she has had a murmur for a long time; most recent echocardiogram in our system from 2011 was notable for aortic sclerosis without significant stenosis, as well as mild aortic regurgitation.  Avoid preload and afterload reducing medications  Transthoracic echocardiogram in the morning  If blood pressure becomes soft, could consider gentle hydration with close monitoring of volume status  Hypertension: Blood pressure is low normal to low in the setting of acute MI  Hold home atenolol  Consider adding carvedilol and possibly ACEI over coming days as blood pressure allows  Esophagitis and food impaction:  NPO, given history of food impaction  Continue pantoprazole  Code Status:  DNR/DNI - This was confirmed with the patient and her daughter  Zenovia JordanSigned, Tniya Bowditch MD 06/21/2016, 12:05 AM Pager: [phone]

## 2016-06-21 NOTE — ED Provider Notes (Signed)
[facility]. [name].  12:12 AM  Upon arrival, patient is now refusing cath per conversation with cardiology.  Will give ASA in the ED rectally and also fentanyl for pain control.  BP is low, patient is receiving IVF currently.  Will admit to cardiac ICU for further medical management.     Tomasita CrumbleAdeleke Chason Mciver, MD 06/21/16 [phone]

## 2016-06-21 NOTE — Progress Notes (Signed)
ANTICOAGULATION CONSULT NOTE - Initial Consult  Pharmacy Consult for heparin Indication: STEMI  Allergies  Allergen Reactions  . Codeine     Unknown  . Penicillins Other (See Comments)    Reaction: unknown  Has patient had a PCN reaction causing immediate rash, facial/tongue/throat swelling, SOB or lightheadedness with hypotension: n/a Has patient had a PCN reaction causing severe rash involving mucus membranes or skin necrosis: n/a Has patient had a PCN reaction that required hospitalization: n/a Has patient had a PCN reaction occurring within the last 10 years: n/a If all of the above answers are "NO", then may proceed with Cephalosporin use.   . Sulfonamide Derivatives     Pt states that it was so long ago that she does not remember what happened.     Patient Measurements: Height: 5\' 2"  (157.5 cm) Weight: 117 lb 15.1 oz (53.5 kg) IBW/kg (Calculated) : 50.1  Vital Signs: Temp Source: Oral (11/20 2227) BP: 96/53 (11/21 0031) Pulse Rate: 100 (11/21 0031)  Labs:  Recent Labs  06/23/2016 2240 06/21/16 0040  HGB 13.5  --   HCT 41.8  --   PLT 304  --   APTT  --  33  LABPROT  --  13.8  INR  --  1.06  CREATININE 0.86  --     Estimated Creatinine Clearance: 33 mL/min (by C-G formula based on SCr of 0.86 mg/dL).   Medical History: Past Medical History:  Diagnosis Date  . ARTHRITIS   . CEREBRAL EMBOLISM, WITH INFARCTION 07/2010   mild R HP  . CORONARY ARTERY DISEASE   . Cyst of breast 2007   cyst on breast  . HYPERLIPIDEMIA   . HYPERTENSION   . MACULAR DEGENERATION   . Osteoporosis, post-menopausal 07/2012   T10 compression fx, R hip fx 04/02/14 after fall  . PERIPHERAL VASCULAR DISEASE   . Sebaceous cyst    L chin and R neck  . Subclavian steal syndrome     Medications:  Prescriptions Prior to Admission  Medication Sig Dispense Refill Last Dose  . atenolol (TENORMIN) 25 MG tablet TAKE 1 TABLET BY MOUTH DAILY . TAKE WITH 50 MG TABLET FOR A TOTAL OF 75 MG  (Patient taking differently: TAKE 25 MG BY MOUTH DAILY . TAKE WITH 50 MG TABLET FOR A TOTAL OF 75 MG) 90 tablet 3 06/16/2016 at 1000  . atenolol (TENORMIN) 50 MG tablet TAKE 1 TABLET BY MOUTH DAILY . TAKE WITH 25 MG TABLET FOR A TOTAL OF 75 MG. (Patient taking differently: TAKE 50 MG BY MOUTH DAILY . TAKE WITH 25 MG TABLET FOR A TOTAL OF 75 MG.) 90 tablet 3 06/16/2016 at 1000  . clopidogrel (PLAVIX) 75 MG tablet Take 1 tablet (75 mg total) by mouth daily. 90 tablet 1 06/16/2016 at 1000  . pantoprazole (PROTONIX) 40 MG tablet Take 1 tablet (40 mg total) by mouth daily at 6 (six) AM. 30 tablet 0   . pravastatin (PRAVACHOL) 20 MG tablet TAKE 1 TABLET (20 MG TOTAL) BY MOUTH DAILY. 90 tablet 3 06/15/2016 at Unknown time   Scheduled:  . [START ON Jan 17, 2016] aspirin EC  81 mg Oral Daily  . atorvastatin  40 mg Oral q1800  . clopidogrel  300 mg Oral Once  . clopidogrel  75 mg Oral Daily  . heparin  3,200 Units Intravenous Once  . metoprolol tartrate  12.5 mg Oral BID  . pantoprazole  40 mg Oral Q0600   Infusions:  . heparin 2,500 Units/hr (06/26/2016 2313)  Assessment: 82yo [facility] but on arrival pt refused cath, to begin medical management.  Goal of Therapy:  Heparin level 0.3-0.7 units/ml Monitor platelets by anticoagulation protocol: Yes   Plan:  Pt rec'd heparin 2500 unit bolus at [facility]; [facility] ordered heparin gtt at 650 units/hr but was never started >> spoke w/ RN who will [facility] gtt [facility]; monitor heparin levels and CBC.  Vernard GamblesVeronda Tabria Steines, PharmD, BCPS  06/21/2016,1:26 AM

## 2016-06-21 NOTE — Consult Note (Addendum)
Consultation Note Date: 06/21/2016   Patient Name: [name]  DOB: 09-26-1921  MRN: [id number]  Age / Sex: 82 y.o., female  PCP: Binnie Rail, MD Referring Physician: Clayborne Dana, MD  Reason for Consultation: Establishing goals of care, Non pain symptom management and Pain control  HPI/Patient Profile: 82 y.o. female  with past medical history of coronary artery disease, stroke, peripheral vascular disease, hypertension, hyperlipidemia, chronic back and hip pain admitted on 06/04/2016 after presenting to [facility] ED with chest pain. Of note she had another admission just prior to this for chest pain where EGD was performed with removal of fluid from her esophagus.  She was found to have STEMI and transferred to [facility] for intervention. After arrival to [facility] in discussion with cardiology she refused catheterization. Plan is for continued medical management. Palliative consulted for goals and symptom management.  Clinical Assessment and Goals of Care: I met this morning with [name] in her room. She reports that she is uncomfortable but has a difficult time verbalizing exactly why she is uncomfortable. She seems very anxious.    She states understanding that she is very ill and has high risk of decompensation and death. She was clear when asked that her desire is to focus on ensuring that she is comfortable. It was difficult to obtain further information from her secondary to agitation.  I called and spoke with her daughter, [name].  She reports understanding that her mother is critically ill and is at high risk for acute decompensation and death. She states the doctors have been doing her job explaining things to her. She states that her mother has had declining quality of life the last several months. Goal moving forward is to see how she does over the next 24 hours.  She is very clear that her  mother's wish is primarily for comfort and that her mother should not be deprived of medications for pain, shortness of breath, or anxiety due to concern about side effects such as hypotension.  I asked her bedside nurse to give a dose of Ativan.  [name] seemed much more comfortable following this.   SUMMARY OF RECOMMENDATIONS   - No further invasive procedures - Agree with morphine as needed for comfort. She was very agitated on examination and I additionally ordered for Ativan as needed.  Plan is for continued conservative therapy and see how she does over the next 24 hours. She and her daughter understand that she is at high risk for acute decompensation and death. Primary goal is to ensure her comfort and medication should not be held if needed for comfort due to concern for blood pressure or other vital signs. - Plan for follow-up meeting tomorrow at 2 PM with patient and her daughter  Code Status/Advance Care Planning:  DNR     Symptom Management:   Agree with morphine as needed for pain or shortness of breath  Plan for addition of Ativan 1 mg every 4 hours as needed for agitation. She  is very restless and appears anxious and agitated on examination. If Ativan is ineffective, would consider Haldol.  Palliative Prophylaxis:   Aspiration, Bowel Regimen, Delirium Protocol and Frequent Pain Assessment  Additional Recommendations (Limitations, Scope, Preferences):  Continue current care. No additional invasive interventions. If decompensates, focus is on comfort.  Psycho-social/Spiritual:   Desire for further Chaplaincy support: Did not address today  Additional Recommendations: Education on Hospice  Prognosis:   Unable to determine. She is at very high risk for acute decompensation and death. I discussed this at length with her daughter via phone today. If she survives this hospitalization, she would be appropriate for hospice if so desired.  Discharge Planning: To Be  Determined      Primary Diagnoses: Present on Admission: . STEMI (ST elevation myocardial infarction) ([place])   I have reviewed the medical record, interviewed the patient and family, and examined the patient. The following aspects are pertinent.  Past Medical History:  Diagnosis Date  . ARTHRITIS   . CEREBRAL EMBOLISM, WITH INFARCTION 07/2010   mild R HP  . CORONARY ARTERY DISEASE   . Cyst of breast 2007   cyst on breast  . HYPERLIPIDEMIA   . HYPERTENSION   . MACULAR DEGENERATION   . Osteoporosis, post-menopausal 07/2012   T10 compression fx, R hip fx 04/02/14 after fall  . PERIPHERAL VASCULAR DISEASE   . Sebaceous cyst    L chin and R neck  . Subclavian steal syndrome    Social History   Social History  . Marital status: Widowed    Spouse name: N/A  . Number of children: N/A  . Years of education: N/A   Occupational History  . Retired from Nurse, learning disability    Social History Main Topics  . Smoking status: Former Smoker    Packs/day: 1.00    Years: 61.00    Quit date: 09/29/2009  . Smokeless tobacco: Never Used  . Alcohol use 0.0 oz/week     Comment: Occasional  . Drug use: No  . Sexual activity: No   Other Topics Concern  . None   Social History Narrative   [place]ives in [place]. Moved from [place] in the fall of 2011 to be near daughter   Family History  Problem Relation Age of Onset  . Heart attack Father   . Cancer Mother   . Heart disease Other     Parent  . Miscarriages / Stillbirths Other   . Hyperlipidemia Brother   . Hypertension Brother   . Stroke Brother    Scheduled Meds: . [START ON 09-Jul-2016] aspirin EC  81 mg Oral Daily  . atorvastatin  40 mg Oral q1800  . clopidogrel  75 mg Oral Daily  . metoprolol tartrate  12.5 mg Oral BID  . pantoprazole  40 mg Oral Q0600   Continuous Infusions: . heparin 650 Units/hr (06/21/16 1100)   PRN Meds:.acetaminophen, iopamidol, LORazepam, morphine injection, nitroGLYCERIN, ondansetron  (ZOFRAN) IV Medications Prior to Admission:  Prior to Admission medications   Medication Sig Start Date End Date Taking? Authorizing Provider  atenolol (TENORMIN) 25 MG tablet TAKE 1 TABLET BY MOUTH DAILY . TAKE WITH 50 MG TABLET FOR A TOTAL OF 75 MG Patient taking differently: TAKE 25 MG BY MOUTH DAILY . TAKE WITH 50 MG TABLET FOR A TOTAL OF 75 MG 07/15/15   Rowe Clack, MD  atenolol (TENORMIN) 50 MG tablet TAKE 1 TABLET BY MOUTH DAILY . TAKE WITH 25 MG TABLET FOR A TOTAL OF  75 MG. Patient taking differently: TAKE 50 MG BY MOUTH DAILY . TAKE WITH 25 MG TABLET FOR A TOTAL OF 75 MG. 07/15/15   Rowe Clack, MD  clopidogrel (PLAVIX) 75 MG tablet Take 1 tablet (75 mg total) by mouth daily. 01/26/16   Binnie Rail, MD  pantoprazole (PROTONIX) 40 MG tablet Take 1 tablet (40 mg total) by mouth daily at 6 (six) AM. 06/19/16   Hosie Poisson, MD  pravastatin (PRAVACHOL) 20 MG tablet TAKE 1 TABLET (20 MG TOTAL) BY MOUTH DAILY. 03/29/16   Binnie Rail, MD   Allergies  Allergen Reactions  . Codeine     Unknown  . Penicillins Other (See Comments)    Reaction: unknown  Has patient had a PCN reaction causing immediate rash, facial/tongue/throat swelling, SOB or lightheadedness with hypotension: n/a Has patient had a PCN reaction causing severe rash involving mucus membranes or skin necrosis: n/a Has patient had a PCN reaction that required hospitalization: n/a Has patient had a PCN reaction occurring within the last 10 years: n/a If all of the above answers are "NO", then may proceed with Cephalosporin use.   . Sulfonamide Derivatives     Pt states that it was so long ago that she does not remember what happened.    Review of Systems  Unable to obtain.  Reports being Uncomfortable but she will not voice any specific complaints. She is very agitated.  Physical Exam  General: Alert, awake, agitated, "just not comfortable".  HEENT: No bruits, no goiter, no JVD Heart: Regular. Holosystolic  murmur appreciated. Lungs: Fair air movement, clear Abdomen: Soft, nontender, nondistended, positive bowel sounds.  Ext: No significant edema Skin: Warm and dry Neuro: Grossly intact, nonfocal. Psych: ? Confused. Agitated. Repeatedly asks for help but cannot verbalize needs.   Vital Signs: BP (!) 62/40 (BP Location: Left Arm)   Pulse 96   Temp 98.9 F (37.2 C) (Axillary)   Resp (!) 30   Ht 5' 2"  (1.575 m)   Wt 56 kg (123 lb 7.3 oz)   SpO2 90%   BMI 22.58 kg/m  Pain Assessment: 0-10   Pain Score: 4    SpO2: SpO2: 90 % O2 Device:SpO2: 90 % O2 Flow Rate: .O2 Flow Rate (L/min): 0 L/min  IO: Intake/output summary:  Intake/Output Summary (Last 24 hours) at 06/21/16 1820 Last data filed at 06/21/16 1100  Gross per 24 hour  Intake            60.67 ml  Output                0 ml  Net            60.67 ml    LBM:   Baseline Weight: Weight: 53.5 kg (117 lb 15.1 oz) Most recent weight: Weight: 56 kg (123 lb 7.3 oz)     Palliative Assessment/Data:   Flowsheet Rows   Flowsheet Row Most Recent Value  Intake Tab  Referral Department  Cardiology  Unit at Time of Referral  ICU  Palliative Care Primary Diagnosis  Cardiac  Date Notified  06/21/16  Palliative Care Type  New Palliative care  Reason for referral  Clarify Goals of Care, Pain  Date of Admission  06/03/2016  Date first seen by Palliative Care  06/21/16  # of days Palliative referral response time  0 Day(s)  # of days IP prior to Palliative referral  1  Clinical Assessment  Palliative Performance Scale Score  30%  Pain Max  last 24 hours  9  Pain Min Last 24 hours  0  Psychosocial & Spiritual Assessment  Palliative Care Outcomes  Patient/Family meeting held?  Yes  Who was at the meeting?  Patient, daughter via phone  Palliative Care Outcomes  Improved non-pain symptom therapy, Improved pain interventions, Clarified goals of care      Time In: 0900 Time Out: 0955 Time Total: 55 Greater than 50%  of this time  was spent counseling and coordinating care related to the above assessment and plan.  Signed by: Micheline Rough, MD   Please contact Palliative Medicine Team phone at [phone] for questions and concerns.  For individual provider: See Shea Evans

## 2016-06-21 NOTE — Progress Notes (Signed)
Cards NP called to bedside to evaluate worsening stemi low blood pressure and ongoing chest pain.

## 2016-06-21 NOTE — Care Management Note (Signed)
Case Management Note  Patient Details  Name: Wendall StadeRuth Froh MRN: [id number] Date of Birth: 07/12/1922  Subjective/Objective:       Adm w stemi             Action/Plan:conservative approach without cath, for paliative care consult   Expected Discharge Date:  06/23/16               Expected Discharge Plan:     In-House Referral:     Discharge planning Services     Post Acute Care Choice:    Choice offered to:     DME Arranged:    DME Agency:     HH Arranged:    HH Agency:     Status of Service:     If discussed at [employer] Meetings, dates discussed:    Additional Comments: lives at home, supp Hope Pigeonda  Aubrionna Istre T, [place]RN 06/21/2016, 2:20 PM

## 2016-06-21 NOTE — Progress Notes (Signed)
ANTICOAGULATION CONSULT NOTE - Follow Up Consult  Pharmacy Consult for Heparin Indication: STEMI  Allergies  Allergen Reactions  . Codeine     Unknown  . Penicillins Other (See Comments)    Reaction: unknown  Has patient had a PCN reaction causing immediate rash, facial/tongue/throat swelling, SOB or lightheadedness with hypotension: n/a Has patient had a PCN reaction causing severe rash involving mucus membranes or skin necrosis: n/a Has patient had a PCN reaction that required hospitalization: n/a Has patient had a PCN reaction occurring within the last 10 years: n/a If all of the above answers are "NO", then may proceed with Cephalosporin use.   . Sulfonamide Derivatives     Pt states that it was so long ago that she does not remember what happened.     Patient Measurements: Height: 5\' 2"  (157.5 cm) Weight: 123 lb 7.3 oz (56 kg) IBW/kg (Calculated) : 50.1   Vital Signs: Temp: 98.9 F (37.2 C) (11/21 1558) Temp Source: Axillary (11/21 1558) BP: 62/40 (11/21 1558) Pulse Rate: 96 (11/21 1558)  Labs:  Recent Labs  06/09/2016 2240 06/21/16 0040 06/21/16 0217 06/21/16 0853 06/21/16 1443 06/21/16 1702  HGB 13.5  --   --  12.4  --   --   HCT 41.8  --   --  38.5  --   --   PLT 304  --   --  273  --   --   APTT  --  33  --   --   --   --   LABPROT  --  13.8  --   --   --   --   INR  --  1.06  --   --   --   --   HEPARINUNFRC  --   --   --  0.55  --  0.86*  CREATININE 0.86  --   --  1.10*  --   --   TROPONINI  --   --  6.65* 28.44* 36.84*  --     Estimated Creatinine Clearance: 25.8 mL/min (by C-G formula based on SCr of 1.1 mg/dL (H)).  Assessment: 82 yo F receiving conservative medical management for STEMI, troponin elevated and rising.   Heparin level now above goal with drip at 650 units/hour. No bleeding noted.   Goal of Therapy:  Heparin level 0.3-0.7 units/ml Monitor platelets by anticoagulation protocol: Yes   Plan:  Decrease heparin to 500  units/hour. Next heparin level in 8 hours (11/22 at 0300) Daily heparin level and CBC Monitor for signs/symptoms of bleeding.   Toys 'R' UsKimberly Lyric Hoar, Pharm.D., BCPS Clinical Pharmacist Pager [phone] 06/21/2016 6:29 PM

## 2016-06-22 ENCOUNTER — Telehealth: Payer: Self-pay | Admitting: Internal Medicine

## 2016-06-22 ENCOUNTER — Other Ambulatory Visit (HOSPITAL_COMMUNITY): Payer: Medicare HMO

## 2016-06-22 DIAGNOSIS — Z029 Encounter for administrative examinations, unspecified: Secondary | ICD-10-CM

## 2016-06-22 LAB — CBC WITH DIFFERENTIAL/PLATELET
BASOS ABS: 0 10*3/uL (ref 0.0–0.1)
Basophils Relative: 0 %
Eosinophils Absolute: 0 10*3/uL (ref 0.0–0.7)
Eosinophils Relative: 0 %
HEMATOCRIT: 35.9 % — AB (ref 36.0–46.0)
HEMOGLOBIN: 11.5 g/dL — AB (ref 12.0–15.0)
LYMPHS PCT: 5 %
Lymphs Abs: 1.2 10*3/uL (ref 0.7–4.0)
MCH: 28 pg (ref 26.0–34.0)
MCHC: 32 g/dL (ref 30.0–36.0)
MCV: 87.6 fL (ref 78.0–100.0)
Monocytes Absolute: 1.4 10*3/uL — ABNORMAL HIGH (ref 0.1–1.0)
Monocytes Relative: 6 %
NEUTROS ABS: 19.5 10*3/uL — AB (ref 1.7–7.7)
NEUTROS PCT: 89 %
Platelets: 277 10*3/uL (ref 150–400)
RBC: 4.1 MIL/uL (ref 3.87–5.11)
RDW: 16.3 % — ABNORMAL HIGH (ref 11.5–15.5)
WBC: 22 10*3/uL — AB (ref 4.0–10.5)

## 2016-06-22 LAB — CBC
HCT: 36.7 % (ref 36.0–46.0)
HEMOGLOBIN: 12 g/dL (ref 12.0–15.0)
MCH: 28.8 pg (ref 26.0–34.0)
MCHC: 32.7 g/dL (ref 30.0–36.0)
MCV: 88 fL (ref 78.0–100.0)
Platelets: 272 10*3/uL (ref 150–400)
RBC: 4.17 MIL/uL (ref 3.87–5.11)
RDW: 16.1 % — ABNORMAL HIGH (ref 11.5–15.5)
WBC: 18.9 10*3/uL — ABNORMAL HIGH (ref 4.0–10.5)

## 2016-06-22 LAB — HEPARIN LEVEL (UNFRACTIONATED): HEPARIN UNFRACTIONATED: 0.72 [IU]/mL — AB (ref 0.30–0.70)

## 2016-06-22 MED ORDER — MORPHINE SULFATE (PF) 2 MG/ML IV SOLN
2.0000 mg | INTRAVENOUS | Status: DC | PRN
  Administered 2016-06-22: 2 mg via INTRAVENOUS
  Filled 2016-06-22: qty 1

## 2016-06-22 MED ORDER — LORAZEPAM 2 MG/ML IJ SOLN: 1.0000 mg | INTRAMUSCULAR | Status: DC | PRN

## 2016-06-27 ENCOUNTER — Telehealth: Payer: Self-pay | Admitting: Cardiology

## 2016-06-27 ENCOUNTER — Telehealth: Payer: Self-pay

## 2016-06-27 NOTE — Telephone Encounter (Signed)
After speaking with Dr.End this am he stated Dr.Nelson will need to be the signing physician of this D/C she did the Death Summary. I will further look into this and see what I can do.

## 2016-06-27 NOTE — Telephone Encounter (Signed)
[facility]-he is aware Dr.Nelson not back in office until Wednesday 06/29/16. I will see if she can sign this d/c for me then.

## 2016-06-29 ENCOUNTER — Telehealth: Payer: Self-pay | Admitting: Cardiology

## 2016-06-29 NOTE — Telephone Encounter (Signed)
Dr.Nelson signed and completed D/C on this patient called [facility] they are aware ready for pick. [place]ax[place] to [place] at [phone] Original copy will be pick up.

## 2016-07-01 ENCOUNTER — Telehealth: Payer: Self-pay | Admitting: Cardiology

## 2016-07-01 NOTE — Progress Notes (Addendum)
Patient Name: [name] Date of Encounter: 06-26-16  Active Problems:   STEMI (ST elevation myocardial infarction) [facility](HCC)   STEMI involving left anterior descending coronary artery (HCC)   Cardiogenic shock (HCC)   Holosystolic murmur   Length of Stay: 1  SUBJECTIVE  The patient is sleeping, in mild labored breathing.   CURRENT MEDS . aspirin EC  81 mg Oral Daily  . atorvastatin  40 mg Oral q1800  . clopidogrel  75 mg Oral Daily  . metoprolol tartrate  12.5 mg Oral BID  . pantoprazole  40 mg Oral Q0600   OBJECTIVE  Vitals:   11/10/15 0310 11/10/15 0400 11/10/15 0800 11/10/15 0829  BP: 94/77 94/74 (!) 81/62 (!) 62/39  Pulse:  (!) 102  (!) 103  Resp: 20 (!) 24 (!) 27 (!) 33  Temp: 97.7 F (36.5 C)   97.9 F (36.6 C)  TempSrc: Oral   Axillary  SpO2: 96% 98%  93%  Weight:      Height:        Intake/Output Summary (Last 24 hours) at 11/10/15 0913 Last data filed at 11/10/15 0500  Gross per 24 hour  Intake            83.78 ml  Output                0 ml  Net            83.78 ml   Filed Weights   06/21/16 0100 06/21/16 0330  Weight: 117 lb 15.1 oz (53.5 kg) 123 lb 7.3 oz (56 kg)   PHYSICAL EXAM  General: somnolent, labored breathing HEENT:  Normal  Neck: Supple without bruits, elevated JVDs.  Lungs:  Resp regular and unlabored, CTA. Heart: RRR no s3, s4, 3/6 systolic murmurs. Abdomen: Soft, non-tender, non-distended, BS + x 4.  Extremities: No clubbing, cyanosis or edema. DP/PT/Radials 2+ and equal bilaterally.  Accessory Clinical Findings  CBC  Recent Labs  11/10/15 0301 11/10/15 0613  WBC 18.9* 22.0*  NEUTROABS  --  19.5*  HGB 12.0 11.5*  HCT 36.7 35.9*  MCV 88.0 87.6  PLT 272 277   Basic Metabolic Panel  Recent Labs  06/08/2016 2240 06/21/16 0853  NA 139 139  K 3.8 4.6  CL 106 111  CO2 24 17*  GLUCOSE 154* 250*  BUN 24* 23*  CREATININE 0.86 1.10*  CALCIUM 9.3 8.7*    Recent Labs  06/21/16 0217 06/21/16 0853 06/21/16 1443    TROPONINI 6.65* 28.44* 36.84*   Radiology/Studies  Dg Chest 2 View  Result Date: 06/16/2016 CLINICAL DATA:  Central chest pain, onset last night. IMPRESSION: 1. No acute abnormality. 2. Tortuous atherosclerotic thoracic aorta, unchanged radiographic appearance. Electronically Signed   By: Rubye OaksMelanie  Ehinger M.D.   On: 06/16/2016 15:46   Ct Chest W Contrast  Result Date: 06/17/2016 CLINICAL DATA:  Acute onset of central chest pain and back pain. Shortness of breath and nausea. Initial encounter. IMPRESSION: 1. Esophagus largely filled with fluid and air, raising concern for mild esophageal dysmotility. 2. Mild mural thrombus along the distal aortic arch and descending thoracic aorta, and also along the right innominate artery. 3. 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 This is better characterized than in 2016. 4. Diffuse coronary artery calcification noted. Calcification at the aortic valve. 5. Bilateral emphysematous change. Mild scarring at the right lung base, with associated chronic pleural thickening. 6. Mild left renal atrophy suggested, not well  characterized. 7. 2.2 cm right adrenal nodule is stable from 2016 and likely benign. 8. Chronic compression deformities at T6, T7 and T10.   Ct Angio Chest/abd/pel For Dissection W And/or Wo Contrast  Result Date: 04-Nov-2015 CLINICAL DATA:  Chest and back pain IMPRESSION: 1. No evidence for acute aortic dissection or mediastinal hematoma. 2. Extensive atherosclerotic vascular calcification and mural thrombus. Bovine arch variant with occluded proximal left subclavian artery which reconstitutes distally. Moderate severe focal stenosis of the SMA just distal to its origin. 3. 2.2 cm right adrenal gland adenoma unchanged. Electronically Signed   By: Jasmine PangKim  Fujinaga M.D.   On: 04-Nov-2015 23:37   TELE: SR, PACs, PVCs    ASSESSMENT AND PLAN  I have had a discussion with patients daughter.  I have explained how serious the situation is with the patient being in cardiogenic shock. She is clear that she wants comfort care only for her mom. While the patient was doing well she was clear about not wanted to be on a prolonged life support.  We will increase morphine to 2mg  iv Q1H and increase ativan frequency.  She has developed GI bleeding this am, we will discontinue ASA, plavix as well as betablockers as she is hypotensive.  The daughter has a meeting arranged with palliative care at 2 pm. I told the daughter to come to the hospital ASAP to be able to spend some time with her mother.  The patient is in critical condition requiring prolonged time for decision making and discussion with the family. Total time spent with the patient was 1 hour.   Signed, Tobias AlexanderKatarina Corinthian Kemler MD, [facility] 06/07/2016

## 2016-07-01 NOTE — Progress Notes (Signed)
Patient expired at 1231 HRs and pronounced by myself Nicole Cellaorothy Delorus Langwell Rn and Smith RobertErin Moore Rn.

## 2016-07-01 NOTE — Progress Notes (Signed)
Pt. Had moderate amount of blood coming out from rectum. MD notified, heparin drip stopped. Continuing to monitor pt. 

## 2016-07-01 NOTE — Progress Notes (Signed)
ANTICOAGULATION CONSULT NOTE - Follow Up Consult  Pharmacy Consult for heparin Indication: STEMI  Labs:  Recent Labs  06/09/2016 2240 06/21/16 0040 06/21/16 0217 06/21/16 0853 06/21/16 1443 06/21/16 1702 2016-03-14 0301  HGB 13.5  --   --  12.4  --   --  12.0  HCT 41.8  --   --  38.5  --   --  36.7  PLT 304  --   --  273  --   --  272  APTT  --  33  --   --   --   --   --   LABPROT  --  13.8  --   --   --   --   --   INR  --  1.06  --   --   --   --   --   HEPARINUNFRC  --   --   --  0.55  --  0.86* 0.72*  CREATININE 0.86  --   --  1.10*  --   --   --   TROPONINI  --   --  6.65* 28.44* 36.84*  --   --      Assessment: 82yo female remains above goal on heparin though spoke w/ RN who states that heparin gtt is still running at 650 units/hr.  Goal of Therapy:  Heparin level 0.3-0.7 units/ml   Plan:  Will decrease heparin gtt slightly to 600 units/hr and check level in 8hr.  Margaret GamblesVeronda Denny Brooks, PharmD, BCPS  April 09, 2016,3:35 AM

## 2016-07-01 NOTE — Discharge Summary (Signed)
Death Summary    Patient ID: [name],  MRN: [id number], DOB/AGE: 80/01/1922 80 y.o.  Admit date: 06/26/2016 Discharge date: 10/15/15  Primary Care Provider: Pincus SanesStacy J Brooks Primary Cardiologist: [name]  Discharge Diagnoses    Active Problems:   STEMI (ST elevation myocardial infarction) [facility](HCC)   STEMI involving left anterior descending coronary artery [facility](HCC)   Cardiogenic shock (HCC)   Hyperlipidemia   Essential hypertension   Carotid artery disease (HCC)   Holosystolic murmur   Discharge planning issues   Allergies Allergies  Allergen Reactions  . Codeine Nausea And Vomiting  . Penicillins Other (See Comments)    Has patient had a PCN reaction causing immediate rash, facial/tongue/throat swelling, SOB or lightheadedness with hypotension: Yes Has patient had a PCN reaction causing severe rash involving mucus membranes or skin necrosis: Unk Has patient had a PCN reaction that required hospitalization: Unk Has patient had a PCN reaction occurring within the last 10 years: Unk If all of the above answers are "NO", then may proceed with Cephalosporin use.   . Sulfonamide Derivatives     Pt states that it was so long ago that she does not remember what happened.     Diagnostic Studies/Procedures    None _____________   History of Present Illness     [name] is a 82 y/o woman with history of coronary artery disease, stroke, extensive peripheral vascular disease, hypertension, hyperlipidemia, and chronic back and hip pain, Who presented [facility] department with back and chest pain. She reported similar pain over the last several weeks, including during a ED visit [facility] 4 days ago. At that time, CT scan was concerning for food impaction in the esophagus, and the patient underwent EGD demonstrated retained food in the esophagus with accompanying esophagitis and non-bleeding ulcers. The patient returned on 11/21 because of continued pain in  her back as well as more severe chest pain. EKG in the emergency department was notable for anterior ST elevation with Q waves. Due to her accompanying back pain and extensive peripheral vascular disease, aortic dissection CT was performed, demonstrating diffuse atherosclerosis without evidence of aortic dissection. The patient was therefore referred to [facility] for possible emergent left heart catheterization.  At this time of exam by [name], the patient continued to complain of severe left-sided chest pain as well as pain in her back. She also noted the sensation of needing to belch. She denied shortness of breath. She complained of back and hip pain as well, stating it is difficult for her to lie on her back. She refused aspirin at [facility] due to difficulty swallowing. She has otherwise been trying to take her medications, including clopidogrel.  Hospital Course     Consultants: Palliative care  On arrival her presentation was very concerning for anterior STEMI due to LAD occlusion. The treatment options, including cardiac catheterizations and medical therapy were discussed with the patient and her daughter by [name]. She reported " that under no circumstances would she want to be resuscitated or have "heroic procedures" performed. Her only desire at this time is for her pain to be controlled". She was informed that her cardiovascular outcomes including mortality are improved with primary PCI over medical therapy, though given her comorbidities, cardiac catheterization his higher risk than in most patients. She again stated that she preferred medical therapy and her daughter was in agreement with this. Given this decision she was admitted to cardia ICU,  started on heparin, loaded with plavix and given asa. Morphine was ordered for pain control as her blood pressure would tolerate, and nitrates were avoided given she had noted severe AS on exam. Her DNR/DNI status were confirmed with both the  patient and her daughter. Planned for 2D echo.   The following morning she did report ongoing chest pain, though blood pressure noted to drop into the 80s systolic. It was reaffirmed with the patient and daughter an option of treatment center include active invasive management versus conservative medical management with palliative care/hospice option. The patient reconfirmed a conservative management approach. Follow up EKG showed persistent precordial ST elevation and Q waves in inferior leads II, III, aVF, and V1 through V4. She was seen by [name] who confirmed plans with the patient, and suggested palliative/hospice care consultation.   Palliative care meet with the family and added ativan for further comfort. Goals again were noted for comfort. Planned for a family meeting the following day.   On 11/22 she was seen by [name]. Her blood pressure had dropped, HR elevated and mild labored breathing. Her medications were increased for further comfort. Her daughter was called to come and be with the patient, as she was in cardiogenic shock. She also developed some GI bleeding, therefore her heparin, asa and plavix were stopped. The palliative care RN came to the bedside to talk with family about further hospice options. During that time, the patients HR dropped along with O2, and breathing slowed. Pulse check at 12:31 noted no radially, or carotid pulse. Family was at the bedside, but expressed they did not wish to remain with the body. Attending provider was notified. Time of death was called at 12:31pm on 06/26/2016.  ____________  Discharge Vitals N/A  Filed Weights   06/21/16 0100 06/21/16 0330  Weight: 117 lb 15.1 oz (53.5 kg) 123 lb 7.3 oz (56 kg)    Labs & Radiologic Studies    CBC  Recent Labs  06/10/2016 0301 06/15/2016 0613  WBC 18.9* 22.0*  NEUTROABS  --  19.5*  HGB 12.0 11.5*  HCT 36.7 35.9*  MCV 88.0 87.6  PLT 272 277   Basic Metabolic Panel  Recent Labs   82/95/622017/01/06 2240 06/21/16 0853  NA 139 139  K 3.8 4.6  CL 106 111  CO2 24 17*  GLUCOSE 154* 250*  BUN 24* 23*  CREATININE 0.86 1.10*  CALCIUM 9.3 8.7*   Liver Function Tests No results for input(s): AST, ALT, ALKPHOS, BILITOT, PROT, ALBUMIN in the last 72 hours. No results for input(s): LIPASE, AMYLASE in the last 72 hours. Cardiac Enzymes  Recent Labs  06/21/16 0217 06/21/16 0853 06/21/16 1443  TROPONINI 6.65* 28.44* 36.84*   BNP Invalid input(s): POCBNP D-Dimer No results for input(s): DDIMER in the last 72 hours. Hemoglobin A1C No results for input(s): HGBA1C in the last 72 hours. Fasting Lipid Panel No results for input(s): CHOL, HDL, LDLCALC, TRIG, CHOLHDL, LDLDIRECT in the last 72 hours. Thyroid Function Tests No results for input(s): TSH, T4TOTAL, T3FREE, THYROIDAB in the last 72 hours.  Invalid input(s): FREET3 _____________  Dg Chest 2 View  Result Date: 06/16/2016 CLINICAL DATA:  Central chest pain, onset last night. EXAM: CHEST  2 VIEW COMPARISON:  Radiograph 11/25/2015.  Chest CT 11/30/2014 FINDINGS: Stable mediastinal contours with tortuous calcified thoracic aorta. No pulmonary edema or congestive failure. Minimal right basilar atelectasis or scarring. No focal airspace disease, pleural fluid or pneumothorax. Multiple compression deformities in the thoracic spine, suboptimally  assessed due to background osteopenia. IMPRESSION: 1. No acute abnormality. 2. Tortuous atherosclerotic thoracic aorta, unchanged radiographic appearance. Electronically Signed   By: Rubye Oaks M.D.   On: 06/16/2016 15:46   Ct Chest W Contrast  Result Date: 06/17/2016 CLINICAL DATA:  Acute onset of central chest pain and back pain. Shortness of breath and nausea. Initial encounter. EXAM: CT CHEST WITH CONTRAST TECHNIQUE: Multidetector CT imaging of the chest was performed during intravenous contrast administration. CONTRAST:  75mL ISOVUE-300 IOPAMIDOL (ISOVUE-300) INJECTION 61%  COMPARISON:  Chest radiograph performed earlier today at 3:30 p.m., and CTA of the chest performed 11/30/2014 FINDINGS: Cardiovascular: Scattered calcification is noted along the thoracic aorta, with mild mural thrombus along the distal the aortic arch and descending thoracic aorta, and also along the right innominate artery. 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 The ascending thoracic aorta measures up to 4.1 cm in AP dimension. Given the patient's age, this remains within normal limits. Diffuse coronary artery calcifications are seen. Calcification is noted at the aortic valve. Mediastinum/Nodes: The esophagus is filled with fluid and air, raising question for mild esophageal dysmotility. No mediastinal lymphadenopathy is seen. No pericardial effusion is identified. The thyroid gland is unremarkable in appearance. No axillary lymphadenopathy is seen. Lungs/Pleura: Bilateral emphysematous change is noted. Mild scarring is noted at the right lung base, with associated chronic pleural thickening. No significant pleural effusion or pneumothorax is seen. No masses are identified. Upper Abdomen: The visualized portions of the liver and spleen are unremarkable. The visualized portions of the pancreas, left adrenal gland right kidney are within normal limits. Mild left renal atrophy is suggested, though this is difficult to fully assess due to motion artifact. A 2.2 cm right adrenal nodule is again noted. Musculoskeletal: No acute osseous abnormalities are identified. Chronic compression deformities are noted at T6, T7 and T10. The visualized musculature is unremarkable in appearance. IMPRESSION: 1. Esophagus largely filled with fluid and air, raising concern for mild esophageal dysmotility. 2. Mild mural thrombus along the distal aortic arch and descending thoracic aorta, and also along the right innominate artery. 3. 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 This is better characterized than in 2016. 4. Diffuse coronary artery calcification noted. Calcification at the aortic valve. 5. Bilateral emphysematous change. Mild scarring at the right lung base, with associated chronic pleural thickening. 6. Mild left renal atrophy suggested, not well characterized. 7. 2.2 cm right adrenal nodule is stable from 2016 and likely benign. 8. Chronic compression deformities at T6, T7 and T10. Electronically Signed   By: Roanna Raider M.D.   On: 06/17/2016 00:43   Ct Angio Chest/abd/pel For Dissection W And/or Wo Contrast  Result Date: 2016/07/16 CLINICAL DATA:  Chest and back pain EXAM: CT ANGIOGRAPHY CHEST, ABDOMEN AND PELVIS TECHNIQUE: Multidetector CT imaging through the chest, abdomen and pelvis was performed using the standard protocol during bolus administration of intravenous contrast. Multiplanar reconstructed images and MIPs were obtained and reviewed to evaluate the vascular anatomy. CONTRAST:  100 mL Isovue 370 intravenous COMPARISON:  06/16/2016, 11/30/2014 FINDINGS: CTA CHEST FINDINGS Cardiovascular: There is ectasia of the ascending aorta, measuring up to 4 cm in maximum diameter. There is ectasia diffusely of the aortic arch with diffuse irregular calcification and mural thrombus. Arch measures 3.6 cm in maximum diameter. There is no dissection seen. There is no evidence for mediastinal hematoma. Bovine arch variant. There is occlusion of the origin and proximal  left subclavian artery with reconstitution distally. Prominent central pulmonary arteries. No gross filling defects. Moderate mural thrombus is present within the descending thoracic aorta which is noted to be tortuous. There are coronary artery calcifications. Heart size is borderline enlarged. No significant pericardial effusion. Mediastinum/Nodes: Imaged thyroid gland grossly unremarkable. Few nonspecific sub cm mediastinal lymph nodes. No  hilar adenopathy. No axillary adenopathy. Minimal air distention of the esophagus. Lungs/Pleura: Moderate emphysematous disease bilaterally. No acute consolidation or effusion. Mild atelectasis or scar in the right lower lobe. Musculoskeletal: Chronic compression deformities T6, T7 and T10 as before. Review of the MIP images confirms the above findings. CTA ABDOMEN AND PELVIS FINDINGS VASCULAR Aorta: Diffuse atherosclerotic vascular calcification with extensive mural thrombus present. No dissection is seen. Celiac: Atherosclerotic calcification at the origin of the celiac axis but no high-grade stenosis. Mild narrowing at the origin is suggested. SMA: Atherosclerotic calcifications are present within the SMA. Moderate severe focal stenosis of the proximal SMA , just distal to its origin. Renals: Atherosclerotic calcification at the origin of the right renal artery with scattered calcifications throughout the course of the vessel. No high-grade stenosis. Mild stenosis of the proximal left renal artery with scattered atherosclerotic calcification IMA: Visualized and grossly patent.  Calcified plaque at the origin. Inflow: Diffuse calcification and mural thrombus within the distal abdominal aorta. Moderate diffuse disease of the common iliac artery's with at least moderate focal stenosis of the proximal right common iliac artery. Moderate severe disease of right internal iliac artery. Moderate diffuse disease of the right external iliac artery with moderate severe irregular stenosis of the right common femoral artery. Moderate diffuse disease of the left common iliac artery. Moderate severe diffuse disease of left internal iliac artery. Mild to moderate diffuse disease of the left external iliac artery. Left common femoral artery shows mild focal narrowing but is grossly patent. Veins: Suboptimal evaluation due to arterial phase exam. Review of the MIP images confirms the above findings. NON-VASCULAR Hepatobiliary: No  focal liver abnormality is seen. No gallstones, gallbladder wall thickening, or biliary dilatation. Pancreas: Unremarkable. No pancreatic ductal dilatation or surrounding inflammatory changes. Spleen: Irregular hypodensity in the spleen likely related to phase of enhancement. Adrenals/Urinary Tract: 2.2 cm right adrenal gland adenoma unchanged. Left adrenal gland normal. Kidneys are slightly atrophic. No hydronephrosis. The bladder is unremarkable. Stomach/Bowel: There is no dilated large or small bowel. The stomach is unremarkable. The appendix is visualized and is normal. Sigmoid colon diverticular disease without bowel wall thickening. Lymphatic: No significantly enlarged abdominal or pelvic lymph nodes. Reproductive: Status post hysterectomy. No adnexal masses. Other: No free air or free fluid. Musculoskeletal: Severe multilevel degenerative changes of the lumbar spine. Grade 1 anterior listhesis of L3 on L4. Multilevel vacuum disc phenomenon. Review of the MIP images confirms the above findings. IMPRESSION: 1. No evidence for acute aortic dissection or mediastinal hematoma. 2. Extensive atherosclerotic vascular calcification and mural thrombus. Bovine arch variant with occluded proximal left subclavian artery which reconstitutes distally. Moderate severe focal stenosis of the SMA just distal to its origin. 3. 2.2 cm right adrenal gland adenoma unchanged. Electronically Signed   By: Jasmine Pang M.D.   On: 06/09/2016 23:37   Disposition   Pt deceased. TOD was 12:31pm.     Discharge Medications   N/A   Duration of Discharge Encounter   Greater than 30 minutes including physician time.  Signed, Laverda Page NP-C Jun 29, 2016, 3:04 PM

## 2016-07-01 NOTE — Telephone Encounter (Signed)
[facility] Cannot locate 1st original D/C they brought by another one-Dr. Delton SeeNelson was able to sign another one today while in office. I have called Dennis BastJoyce Johnson with [facility] at [phone] making her aware ready for pick up.

## 2016-07-01 NOTE — Progress Notes (Signed)
Palliative Medicine RN Note: Rec'd call from RN that family is requesting morphine gtt; family is set to follow up with Dr Neale BurlyFreeman at 1400. Arrived to find pt's daughter and son in law. They asked about what a morphine drip was and if it would be helpful, as they nurse had talked about the pt needing it. Explained that pt's morphine use has been low over the last 12 hours (5 mg IV since midnight; only 4 prn doses), so a drip would not be appropriate.  Supported daughter as she talked through wanting pt's suffering to be over and the expected changes that occur as patients near death (secretions, breathing changes, color and temperature changes). As we spoke, pt's HR dropped and the O2 monitor no longer detected a pulse. I checked and found no pulse radially or in the carotid. Pt took 2 more very shallow respirations, then stopped breathing completely around 1230. Called RN Nicole CellaDorothy to bedside. Pt's daughter reports that she is comfortable with her mother's peaceful death, and they are appreciative of the very [facility].  Discussed post-mortem procedures staff will follow. Family does not wish to stay with body.They will use [facility] (contact is Lajuan LinesHeather Grice [phone]).   Margret ChanceMelanie G. Maiah Sinning, RN, BSN, [facility] 06/14/2016 1:34 PM Cell [phone] 8:00-4:00 Monday-Friday Office [phone]

## 2016-07-01 NOTE — Telephone Encounter (Signed)
D/C received via fax on this patient- Dr.End is out of office I have called and spoke with [facility] and made them aware.

## 2016-07-01 DEATH — deceased

## 2016-08-08 ENCOUNTER — Ambulatory Visit: Payer: Medicare HMO | Admitting: Internal Medicine
